# Patient Record
Sex: Female | Born: 1945 | Race: Black or African American | Hispanic: No | Marital: Married | Smoking: Never smoker
Health system: Southern US, Community
[De-identification: ages and names within clinical notes are randomized; demographics above are authoritative.]

## PROBLEM LIST (undated history)

## (undated) DIAGNOSIS — E119 Type 2 diabetes mellitus without complications: Secondary | ICD-10-CM

## (undated) DIAGNOSIS — I10 Essential (primary) hypertension: Secondary | ICD-10-CM

## (undated) DIAGNOSIS — M8580 Other specified disorders of bone density and structure, unspecified site: Secondary | ICD-10-CM

## (undated) DIAGNOSIS — N200 Calculus of kidney: Secondary | ICD-10-CM

## (undated) DIAGNOSIS — K219 Gastro-esophageal reflux disease without esophagitis: Secondary | ICD-10-CM

## (undated) DIAGNOSIS — E785 Hyperlipidemia, unspecified: Secondary | ICD-10-CM

## (undated) DIAGNOSIS — M1990 Unspecified osteoarthritis, unspecified site: Secondary | ICD-10-CM

## (undated) DIAGNOSIS — T7840XA Allergy, unspecified, initial encounter: Secondary | ICD-10-CM

## (undated) HISTORY — DX: Essential (primary) hypertension: I10

## (undated) HISTORY — PX: TONSILLECTOMY: SUR1361

## (undated) HISTORY — DX: Allergy, unspecified, initial encounter: T78.40XA

## (undated) HISTORY — DX: Hyperlipidemia, unspecified: E78.5

## (undated) HISTORY — DX: Other specified disorders of bone density and structure, unspecified site: M85.80

## (undated) HISTORY — DX: Unspecified osteoarthritis, unspecified site: M19.90

## (undated) HISTORY — PX: BILATERAL CARPAL TUNNEL RELEASE: SHX6508

## (undated) HISTORY — PX: ABDOMINAL HYSTERECTOMY: SHX81

## (undated) HISTORY — PX: KNEE SURGERY: SHX244

---

## 1997-11-20 ENCOUNTER — Ambulatory Visit (HOSPITAL_COMMUNITY): Admission: RE | Admit: 1997-11-20 | Discharge: 1997-11-20 | Payer: Self-pay | Admitting: Gastroenterology

## 2000-06-29 ENCOUNTER — Encounter: Payer: Self-pay | Admitting: Family Medicine

## 2000-06-29 ENCOUNTER — Encounter: Admission: RE | Admit: 2000-06-29 | Discharge: 2000-06-29 | Payer: Self-pay | Admitting: Family Medicine

## 2001-06-29 ENCOUNTER — Encounter: Admission: RE | Admit: 2001-06-29 | Discharge: 2001-06-29 | Payer: Self-pay | Admitting: Family Medicine

## 2001-06-29 ENCOUNTER — Encounter: Payer: Self-pay | Admitting: Family Medicine

## 2003-01-21 LAB — CONVERTED CEMR LAB: Pap Smear: NORMAL

## 2003-02-27 ENCOUNTER — Ambulatory Visit: Admission: RE | Admit: 2003-02-27 | Discharge: 2003-02-27 | Payer: Self-pay | Admitting: Gastroenterology

## 2003-12-07 ENCOUNTER — Inpatient Hospital Stay (HOSPITAL_COMMUNITY): Admission: EM | Admit: 2003-12-07 | Discharge: 2003-12-08 | Payer: Self-pay | Admitting: Emergency Medicine

## 2003-12-07 ENCOUNTER — Ambulatory Visit: Payer: Self-pay | Admitting: Internal Medicine

## 2003-12-07 ENCOUNTER — Ambulatory Visit: Payer: Self-pay | Admitting: Cardiology

## 2003-12-08 ENCOUNTER — Encounter: Payer: Self-pay | Admitting: Cardiology

## 2004-06-03 ENCOUNTER — Encounter: Admission: RE | Admit: 2004-06-03 | Discharge: 2004-06-03 | Payer: Self-pay | Admitting: Family Medicine

## 2004-10-31 ENCOUNTER — Emergency Department (HOSPITAL_COMMUNITY): Admission: EM | Admit: 2004-10-31 | Discharge: 2004-10-31 | Payer: Self-pay | Admitting: Emergency Medicine

## 2005-07-31 ENCOUNTER — Encounter: Admission: RE | Admit: 2005-07-31 | Discharge: 2005-07-31 | Payer: Self-pay | Admitting: Family Medicine

## 2005-08-11 ENCOUNTER — Encounter: Admission: RE | Admit: 2005-08-11 | Discharge: 2005-08-11 | Payer: Self-pay | Admitting: Family Medicine

## 2005-08-11 ENCOUNTER — Encounter (INDEPENDENT_AMBULATORY_CARE_PROVIDER_SITE_OTHER): Payer: Self-pay | Admitting: *Deleted

## 2006-09-01 ENCOUNTER — Ambulatory Visit: Payer: Self-pay | Admitting: Family Medicine

## 2006-09-01 DIAGNOSIS — M1990 Unspecified osteoarthritis, unspecified site: Secondary | ICD-10-CM | POA: Insufficient documentation

## 2006-09-01 DIAGNOSIS — J45909 Unspecified asthma, uncomplicated: Secondary | ICD-10-CM | POA: Insufficient documentation

## 2006-09-01 DIAGNOSIS — E785 Hyperlipidemia, unspecified: Secondary | ICD-10-CM | POA: Insufficient documentation

## 2006-09-01 DIAGNOSIS — J309 Allergic rhinitis, unspecified: Secondary | ICD-10-CM | POA: Insufficient documentation

## 2006-09-01 DIAGNOSIS — I10 Essential (primary) hypertension: Secondary | ICD-10-CM | POA: Insufficient documentation

## 2006-09-01 LAB — CONVERTED CEMR LAB
Cholesterol, target level: 200 mg/dL
HDL goal, serum: 40 mg/dL
LDL Goal: 130 mg/dL

## 2006-09-03 ENCOUNTER — Encounter: Payer: Self-pay | Admitting: Family Medicine

## 2006-09-08 ENCOUNTER — Ambulatory Visit: Payer: Self-pay | Admitting: Family Medicine

## 2006-09-09 LAB — CONVERTED CEMR LAB
BUN: 11 mg/dL (ref 6–23)
CO2: 31 meq/L (ref 19–32)
Calcium: 9.4 mg/dL (ref 8.4–10.5)
Chloride: 104 meq/L (ref 96–112)
Creatinine, Ser: 0.8 mg/dL (ref 0.4–1.2)
GFR calc Af Amer: 94 mL/min
GFR calc non Af Amer: 78 mL/min
Glucose, Bld: 121 mg/dL — ABNORMAL HIGH (ref 70–99)
Potassium: 3.9 meq/L (ref 3.5–5.1)
Sodium: 141 meq/L (ref 135–145)

## 2006-10-02 ENCOUNTER — Ambulatory Visit: Payer: Self-pay | Admitting: Family Medicine

## 2006-10-15 ENCOUNTER — Encounter: Admission: RE | Admit: 2006-10-15 | Discharge: 2006-10-15 | Payer: Self-pay | Admitting: Orthopedic Surgery

## 2007-04-07 ENCOUNTER — Ambulatory Visit: Payer: Self-pay | Admitting: Family Medicine

## 2007-04-12 DIAGNOSIS — E119 Type 2 diabetes mellitus without complications: Secondary | ICD-10-CM

## 2007-04-12 LAB — CONVERTED CEMR LAB
ALT: 17 units/L (ref 0–35)
AST: 22 units/L (ref 0–37)
Albumin: 3.7 g/dL (ref 3.5–5.2)
Alkaline Phosphatase: 54 units/L (ref 39–117)
BUN: 26 mg/dL — ABNORMAL HIGH (ref 6–23)
Bilirubin, Direct: 0.1 mg/dL (ref 0.0–0.3)
CO2: 29 meq/L (ref 19–32)
Calcium: 9.9 mg/dL (ref 8.4–10.5)
Chloride: 104 meq/L (ref 96–112)
Cholesterol: 224 mg/dL (ref 0–200)
Creatinine, Ser: 0.9 mg/dL (ref 0.4–1.2)
Direct LDL: 120.8 mg/dL
GFR calc Af Amer: 82 mL/min
GFR calc non Af Amer: 68 mL/min
Glucose, Bld: 122 mg/dL — ABNORMAL HIGH (ref 70–99)
HDL: 57 mg/dL (ref >39.0)
Potassium: 4.4 meq/L (ref 3.5–5.1)
Sodium: 140 meq/L (ref 135–145)
Total Bilirubin: 0.7 mg/dL (ref 0.3–1.2)
Total CHOL/HDL Ratio: 3.9
Total Protein: 7.1 g/dL (ref 6.0–8.3)
Triglycerides: 100 mg/dL (ref 0–149)
VLDL: 20 mg/dL (ref 0–40)

## 2007-04-19 ENCOUNTER — Encounter: Admission: RE | Admit: 2007-04-19 | Discharge: 2007-04-19 | Payer: Self-pay | Admitting: Orthopedic Surgery

## 2007-07-13 ENCOUNTER — Ambulatory Visit: Payer: Self-pay | Admitting: Family Medicine

## 2007-07-21 LAB — CONVERTED CEMR LAB
BUN: 15 mg/dL (ref 6–23)
CO2: 28 meq/L (ref 19–32)
Calcium: 9.5 mg/dL (ref 8.4–10.5)
Chloride: 103 meq/L (ref 96–112)
Creatinine, Ser: 0.9 mg/dL (ref 0.4–1.2)
GFR calc Af Amer: 82 mL/min
GFR calc non Af Amer: 68 mL/min
Glucose, Bld: 118 mg/dL — ABNORMAL HIGH (ref 70–99)
Hgb A1c MFr Bld: 6.1 % — ABNORMAL HIGH (ref 4.6–6.0)
Potassium: 4 meq/L (ref 3.5–5.1)
Sodium: 139 meq/L (ref 135–145)

## 2007-09-29 ENCOUNTER — Ambulatory Visit: Payer: Self-pay | Admitting: Family Medicine

## 2007-09-29 DIAGNOSIS — M79609 Pain in unspecified limb: Secondary | ICD-10-CM | POA: Insufficient documentation

## 2007-10-07 ENCOUNTER — Ambulatory Visit: Payer: Self-pay | Admitting: Family Medicine

## 2007-10-08 LAB — CONVERTED CEMR LAB
ALT: 12 units/L (ref 0–35)
AST: 15 units/L (ref 0–37)
Albumin: 3.8 g/dL (ref 3.5–5.2)
Alkaline Phosphatase: 54 units/L (ref 39–117)
BUN: 18 mg/dL (ref 6–23)
Bilirubin, Direct: 0.1 mg/dL (ref 0.0–0.3)
CO2: 28 meq/L (ref 19–32)
Calcium: 9.7 mg/dL (ref 8.4–10.5)
Chloride: 103 meq/L (ref 96–112)
Cholesterol: 243 mg/dL (ref 0–200)
Creatinine, Ser: 0.9 mg/dL (ref 0.4–1.2)
Direct LDL: 132.4 mg/dL
GFR calc Af Amer: 82 mL/min
GFR calc non Af Amer: 68 mL/min
Glucose, Bld: 126 mg/dL — ABNORMAL HIGH (ref 70–99)
HDL: 66.6 mg/dL (ref >39.0)
Potassium: 3.9 meq/L (ref 3.5–5.1)
Sodium: 139 meq/L (ref 135–145)
Total Bilirubin: 0.7 mg/dL (ref 0.3–1.2)
Total CHOL/HDL Ratio: 3.6
Total Protein: 6.9 g/dL (ref 6.0–8.3)
Triglycerides: 129 mg/dL (ref 0–149)
VLDL: 26 mg/dL (ref 0–40)

## 2007-10-11 ENCOUNTER — Ambulatory Visit: Payer: Self-pay | Admitting: Family Medicine

## 2007-10-26 ENCOUNTER — Encounter: Payer: Self-pay | Admitting: Family Medicine

## 2007-11-22 ENCOUNTER — Ambulatory Visit: Payer: Self-pay | Admitting: Family Medicine

## 2007-11-22 DIAGNOSIS — K2990 Gastroduodenitis, unspecified, without bleeding: Secondary | ICD-10-CM

## 2007-11-22 DIAGNOSIS — K2970 Gastritis, unspecified, without bleeding: Secondary | ICD-10-CM | POA: Insufficient documentation

## 2008-01-03 ENCOUNTER — Ambulatory Visit: Payer: Self-pay | Admitting: Family Medicine

## 2008-01-05 LAB — CONVERTED CEMR LAB
ALT: 17 units/L (ref 0–35)
AST: 20 units/L (ref 0–37)
Albumin: 3.9 g/dL (ref 3.5–5.2)
Alkaline Phosphatase: 45 units/L (ref 39–117)
BUN: 20 mg/dL (ref 6–23)
Bilirubin, Direct: 0.1 mg/dL (ref 0.0–0.3)
CO2: 30 meq/L (ref 19–32)
Calcium: 10 mg/dL (ref 8.4–10.5)
Chloride: 105 meq/L (ref 96–112)
Cholesterol: 226 mg/dL (ref 0–200)
Creatinine, Ser: 0.9 mg/dL (ref 0.4–1.2)
Creatinine,U: 108.3 mg/dL
Direct LDL: 126 mg/dL
GFR calc Af Amer: 82 mL/min
GFR calc non Af Amer: 67 mL/min
Glucose, Bld: 111 mg/dL — ABNORMAL HIGH (ref 70–99)
HDL: 64.2 mg/dL (ref >39.0)
Hgb A1c MFr Bld: 6 % (ref 4.6–6.0)
Microalb Creat Ratio: 1.8 mg/g (ref 0.0–30.0)
Microalb, Ur: 0.2 mg/dL (ref 0.0–1.9)
Potassium: 4.1 meq/L (ref 3.5–5.1)
Sodium: 142 meq/L (ref 135–145)
Total Bilirubin: 0.9 mg/dL (ref 0.3–1.2)
Total CHOL/HDL Ratio: 3.5
Total Protein: 6.9 g/dL (ref 6.0–8.3)
Triglycerides: 100 mg/dL (ref 0–149)
VLDL: 20 mg/dL (ref 0–40)

## 2008-01-18 ENCOUNTER — Ambulatory Visit: Payer: Self-pay | Admitting: Family Medicine

## 2008-02-11 ENCOUNTER — Ambulatory Visit (HOSPITAL_COMMUNITY): Admission: RE | Admit: 2008-02-11 | Discharge: 2008-02-11 | Payer: Self-pay | Admitting: Family Medicine

## 2008-02-18 ENCOUNTER — Encounter (INDEPENDENT_AMBULATORY_CARE_PROVIDER_SITE_OTHER): Payer: Self-pay | Admitting: *Deleted

## 2008-02-22 ENCOUNTER — Encounter: Payer: Self-pay | Admitting: Family Medicine

## 2008-02-22 LAB — HM COLONOSCOPY

## 2008-04-06 ENCOUNTER — Ambulatory Visit: Payer: Self-pay | Admitting: Family Medicine

## 2008-04-09 LAB — CONVERTED CEMR LAB
ALT: 14 units/L (ref 0–35)
AST: 19 units/L (ref 0–37)
Albumin: 4 g/dL (ref 3.5–5.2)
Alkaline Phosphatase: 61 units/L (ref 39–117)
BUN: 20 mg/dL (ref 6–23)
Bilirubin, Direct: 0 mg/dL (ref 0.0–0.3)
CO2: 29 meq/L (ref 19–32)
Calcium: 10 mg/dL (ref 8.4–10.5)
Chloride: 107 meq/L (ref 96–112)
Cholesterol: 235 mg/dL — ABNORMAL HIGH (ref 0–200)
Creatinine, Ser: 1 mg/dL (ref 0.4–1.2)
Direct LDL: 141.9 mg/dL
GFR calc non Af Amer: 72.1 mL/min (ref >60)
Glucose, Bld: 108 mg/dL — ABNORMAL HIGH (ref 70–99)
HDL: 58.7 mg/dL (ref >39.00)
Hgb A1c MFr Bld: 5.9 % (ref 4.6–6.5)
Potassium: 4 meq/L (ref 3.5–5.1)
Sodium: 142 meq/L (ref 135–145)
Total Bilirubin: 0.8 mg/dL (ref 0.3–1.2)
Total CHOL/HDL Ratio: 4
Total Protein: 7.2 g/dL (ref 6.0–8.3)
Triglycerides: 108 mg/dL (ref 0.0–149.0)
VLDL: 21.6 mg/dL (ref 0.0–40.0)

## 2008-04-12 ENCOUNTER — Ambulatory Visit: Payer: Self-pay | Admitting: Family Medicine

## 2008-04-12 DIAGNOSIS — N8110 Cystocele, unspecified: Secondary | ICD-10-CM | POA: Insufficient documentation

## 2008-04-13 ENCOUNTER — Encounter: Payer: Self-pay | Admitting: Family Medicine

## 2008-06-20 LAB — CONVERTED CEMR LAB: Pap Smear: NORMAL

## 2008-07-05 ENCOUNTER — Ambulatory Visit: Payer: Self-pay | Admitting: Obstetrics and Gynecology

## 2008-07-19 ENCOUNTER — Ambulatory Visit: Payer: Self-pay | Admitting: Obstetrics and Gynecology

## 2008-08-02 ENCOUNTER — Ambulatory Visit: Payer: Self-pay | Admitting: Obstetrics and Gynecology

## 2008-08-10 ENCOUNTER — Ambulatory Visit: Payer: Self-pay | Admitting: Obstetrics and Gynecology

## 2008-08-17 ENCOUNTER — Ambulatory Visit: Payer: Self-pay | Admitting: Obstetrics and Gynecology

## 2008-09-07 ENCOUNTER — Ambulatory Visit: Payer: Self-pay | Admitting: Obstetrics and Gynecology

## 2008-09-14 ENCOUNTER — Ambulatory Visit: Payer: Self-pay | Admitting: Obstetrics and Gynecology

## 2008-09-29 ENCOUNTER — Ambulatory Visit: Payer: Self-pay | Admitting: Obstetrics & Gynecology

## 2008-12-07 ENCOUNTER — Ambulatory Visit: Payer: Self-pay | Admitting: Family Medicine

## 2008-12-07 DIAGNOSIS — M1A9XX0 Chronic gout, unspecified, without tophus (tophi): Secondary | ICD-10-CM

## 2008-12-07 LAB — CONVERTED CEMR LAB
ALT: 16 units/L (ref 0–35)
AST: 20 units/L (ref 0–37)
Albumin: 4.2 g/dL (ref 3.5–5.2)
Alkaline Phosphatase: 56 units/L (ref 39–117)
BUN: 18 mg/dL (ref 6–23)
Bilirubin, Direct: 0.1 mg/dL (ref 0.0–0.3)
CO2: 29 meq/L (ref 19–32)
Calcium: 9.9 mg/dL (ref 8.4–10.5)
Chloride: 103 meq/L (ref 96–112)
Cholesterol: 224 mg/dL — ABNORMAL HIGH (ref 0–200)
Creatinine, Ser: 0.9 mg/dL (ref 0.4–1.2)
Direct LDL: 127.5 mg/dL
GFR calc non Af Amer: 81.24 mL/min (ref >60)
Glucose, Bld: 104 mg/dL — ABNORMAL HIGH (ref 70–99)
HDL: 66.7 mg/dL (ref >39.00)
Hgb A1c MFr Bld: 5.9 % (ref 4.6–6.5)
Potassium: 4 meq/L (ref 3.5–5.1)
Sodium: 142 meq/L (ref 135–145)
Total Bilirubin: 1 mg/dL (ref 0.3–1.2)
Total CHOL/HDL Ratio: 3
Total Protein: 7.2 g/dL (ref 6.0–8.3)
Triglycerides: 92 mg/dL (ref 0.0–149.0)
Uric Acid, Serum: 8.4 mg/dL — ABNORMAL HIGH (ref 2.4–7.0)
VLDL: 18.4 mg/dL (ref 0.0–40.0)

## 2009-04-03 ENCOUNTER — Ambulatory Visit (HOSPITAL_COMMUNITY): Admission: RE | Admit: 2009-04-03 | Discharge: 2009-04-03 | Payer: Self-pay | Admitting: Family Medicine

## 2009-05-18 ENCOUNTER — Encounter: Admission: RE | Admit: 2009-05-18 | Discharge: 2009-05-18 | Payer: Self-pay | Admitting: Orthopedic Surgery

## 2009-06-08 ENCOUNTER — Ambulatory Visit: Payer: Self-pay | Admitting: Family Medicine

## 2009-06-14 ENCOUNTER — Ambulatory Visit: Payer: Self-pay | Admitting: Family Medicine

## 2009-06-14 LAB — CONVERTED CEMR LAB
ALT: 17 units/L (ref 0–35)
AST: 20 units/L (ref 0–37)
Albumin: 4 g/dL (ref 3.5–5.2)
Alkaline Phosphatase: 55 units/L (ref 39–117)
BUN: 23 mg/dL (ref 6–23)
Basophils Absolute: 0 10*3/uL (ref 0.0–0.1)
Basophils Relative: 0.7 % (ref 0.0–3.0)
Bilirubin, Direct: 0.1 mg/dL (ref 0.0–0.3)
CO2: 31 meq/L (ref 19–32)
Calcium: 10 mg/dL (ref 8.4–10.5)
Chloride: 101 meq/L (ref 96–112)
Cholesterol: 226 mg/dL — ABNORMAL HIGH (ref 0–200)
Creatinine, Ser: 0.9 mg/dL (ref 0.4–1.2)
Creatinine,U: 138.8 mg/dL
Direct LDL: 134.5 mg/dL
Eosinophils Absolute: 0.1 10*3/uL (ref 0.0–0.7)
Eosinophils Relative: 1.8 % (ref 0.0–5.0)
GFR calc non Af Amer: 81.11 mL/min (ref >60)
Glucose, Bld: 110 mg/dL — ABNORMAL HIGH (ref 70–99)
HCT: 37 % (ref 36.0–46.0)
HDL: 61.3 mg/dL (ref >39.00)
Hemoglobin: 12.6 g/dL (ref 12.0–15.0)
Hgb A1c MFr Bld: 6.1 % (ref 4.6–6.5)
Lymphocytes Relative: 23.9 % (ref 12.0–46.0)
Lymphs Abs: 1.8 10*3/uL (ref 0.7–4.0)
MCHC: 34 g/dL (ref 30.0–36.0)
MCV: 86.8 fL (ref 78.0–100.0)
Microalb Creat Ratio: 0.4 mg/g (ref 0.0–30.0)
Microalb, Ur: 0.5 mg/dL (ref 0.0–1.9)
Monocytes Absolute: 0.6 10*3/uL (ref 0.1–1.0)
Monocytes Relative: 8.4 % (ref 3.0–12.0)
Neutro Abs: 4.8 10*3/uL (ref 1.4–7.7)
Neutrophils Relative %: 65.2 % (ref 43.0–77.0)
Platelets: 300 10*3/uL (ref 150.0–400.0)
Potassium: 4.4 meq/L (ref 3.5–5.1)
RBC: 4.26 M/uL (ref 3.87–5.11)
RDW: 13.6 % (ref 11.5–14.6)
Sodium: 139 meq/L (ref 135–145)
TSH: 1.37 microintl units/mL (ref 0.35–5.50)
Total Bilirubin: 0.6 mg/dL (ref 0.3–1.2)
Total CHOL/HDL Ratio: 4
Total Protein: 6.9 g/dL (ref 6.0–8.3)
Triglycerides: 103 mg/dL (ref 0.0–149.0)
Uric Acid, Serum: 8.5 mg/dL — ABNORMAL HIGH (ref 2.4–7.0)
VLDL: 20.6 mg/dL (ref 0.0–40.0)
WBC: 7.4 10*3/uL (ref 4.5–10.5)

## 2009-06-26 ENCOUNTER — Telehealth (INDEPENDENT_AMBULATORY_CARE_PROVIDER_SITE_OTHER): Payer: Self-pay | Admitting: *Deleted

## 2009-06-27 ENCOUNTER — Encounter: Payer: Self-pay | Admitting: Cardiovascular Disease

## 2009-06-27 ENCOUNTER — Ambulatory Visit: Payer: Self-pay | Admitting: Cardiovascular Disease

## 2009-06-27 ENCOUNTER — Encounter (HOSPITAL_COMMUNITY): Admission: RE | Admit: 2009-06-27 | Discharge: 2009-08-21 | Payer: Self-pay | Admitting: Family Medicine

## 2009-06-27 ENCOUNTER — Telehealth: Payer: Self-pay | Admitting: Family Medicine

## 2009-06-27 ENCOUNTER — Telehealth: Payer: Self-pay | Admitting: Cardiovascular Disease

## 2009-06-27 ENCOUNTER — Ambulatory Visit: Payer: Self-pay

## 2009-06-27 DIAGNOSIS — R9439 Abnormal result of other cardiovascular function study: Secondary | ICD-10-CM | POA: Insufficient documentation

## 2009-06-28 ENCOUNTER — Ambulatory Visit: Payer: Self-pay | Admitting: Internal Medicine

## 2009-06-28 ENCOUNTER — Encounter (INDEPENDENT_AMBULATORY_CARE_PROVIDER_SITE_OTHER): Payer: Self-pay | Admitting: *Deleted

## 2009-06-28 DIAGNOSIS — R0789 Other chest pain: Secondary | ICD-10-CM | POA: Insufficient documentation

## 2009-06-28 LAB — CONVERTED CEMR LAB
BUN: 20 mg/dL (ref 6–23)
Basophils Absolute: 0.1 10*3/uL (ref 0.0–0.1)
Basophils Relative: 0.6 % (ref 0.0–3.0)
CO2: 27 meq/L (ref 19–32)
Calcium: 10.4 mg/dL (ref 8.4–10.5)
Chloride: 103 meq/L (ref 96–112)
Creatinine, Ser: 0.9 mg/dL (ref 0.4–1.2)
Eosinophils Absolute: 0.1 10*3/uL (ref 0.0–0.7)
Eosinophils Relative: 0.8 % (ref 0.0–5.0)
GFR calc non Af Amer: 82.15 mL/min (ref >60)
Glucose, Bld: 100 mg/dL — ABNORMAL HIGH (ref 70–99)
HCT: 39 % (ref 36.0–46.0)
Hemoglobin: 13.3 g/dL (ref 12.0–15.0)
INR: 1 (ref 0.8–1.0)
Lymphocytes Relative: 19.1 % (ref 12.0–46.0)
Lymphs Abs: 1.9 10*3/uL (ref 0.7–4.0)
MCHC: 34.2 g/dL (ref 30.0–36.0)
MCV: 85.9 fL (ref 78.0–100.0)
Monocytes Absolute: 0.7 10*3/uL (ref 0.1–1.0)
Monocytes Relative: 7.3 % (ref 3.0–12.0)
Neutro Abs: 7.1 10*3/uL (ref 1.4–7.7)
Neutrophils Relative %: 72.2 % (ref 43.0–77.0)
Platelets: 284 10*3/uL (ref 150.0–400.0)
Potassium: 4.1 meq/L (ref 3.5–5.1)
Prothrombin Time: 10.5 s (ref 9.7–11.8)
RBC: 4.54 M/uL (ref 3.87–5.11)
RDW: 13.6 % (ref 11.5–14.6)
Sodium: 141 meq/L (ref 135–145)
WBC: 9.9 10*3/uL (ref 4.5–10.5)
aPTT: 28.8 s (ref 21.7–28.8)

## 2009-06-29 ENCOUNTER — Inpatient Hospital Stay (HOSPITAL_BASED_OUTPATIENT_CLINIC_OR_DEPARTMENT_OTHER): Admission: RE | Admit: 2009-06-29 | Discharge: 2009-06-29 | Payer: Self-pay | Admitting: Cardiology

## 2009-06-29 ENCOUNTER — Ambulatory Visit: Payer: Self-pay | Admitting: Cardiology

## 2009-07-02 ENCOUNTER — Telehealth: Payer: Self-pay | Admitting: Internal Medicine

## 2009-07-11 ENCOUNTER — Inpatient Hospital Stay (HOSPITAL_COMMUNITY): Admission: RE | Admit: 2009-07-11 | Discharge: 2009-07-15 | Payer: Self-pay | Admitting: Orthopedic Surgery

## 2009-08-13 ENCOUNTER — Encounter: Payer: Self-pay | Admitting: Orthopedic Surgery

## 2009-08-17 ENCOUNTER — Encounter: Payer: Self-pay | Admitting: Family Medicine

## 2009-08-20 ENCOUNTER — Encounter: Payer: Self-pay | Admitting: Orthopedic Surgery

## 2009-09-20 ENCOUNTER — Encounter: Payer: Self-pay | Admitting: Orthopedic Surgery

## 2009-10-01 ENCOUNTER — Encounter: Payer: Self-pay | Admitting: Internal Medicine

## 2009-10-01 ENCOUNTER — Ambulatory Visit: Payer: Self-pay | Admitting: Cardiovascular Disease

## 2009-10-01 ENCOUNTER — Ambulatory Visit (HOSPITAL_COMMUNITY): Admission: RE | Admit: 2009-10-01 | Discharge: 2009-10-01 | Payer: Self-pay | Admitting: Internal Medicine

## 2009-10-01 ENCOUNTER — Ambulatory Visit: Payer: Self-pay | Admitting: Internal Medicine

## 2009-10-01 DIAGNOSIS — R0989 Other specified symptoms and signs involving the circulatory and respiratory systems: Secondary | ICD-10-CM

## 2009-10-05 ENCOUNTER — Encounter: Payer: Self-pay | Admitting: Family Medicine

## 2009-10-20 ENCOUNTER — Encounter: Payer: Self-pay | Admitting: Orthopedic Surgery

## 2009-11-12 ENCOUNTER — Ambulatory Visit: Payer: Self-pay | Admitting: Family Medicine

## 2009-11-13 LAB — CONVERTED CEMR LAB
ALT: 15 units/L (ref 0–35)
AST: 18 units/L (ref 0–37)
Albumin: 3.7 g/dL (ref 3.5–5.2)
Alkaline Phosphatase: 60 units/L (ref 39–117)
BUN: 23 mg/dL (ref 6–23)
Bilirubin, Direct: 0.1 mg/dL (ref 0.0–0.3)
CO2: 26 meq/L (ref 19–32)
Calcium: 9.7 mg/dL (ref 8.4–10.5)
Chloride: 103 meq/L (ref 96–112)
Cholesterol: 217 mg/dL — ABNORMAL HIGH (ref 0–200)
Creatinine, Ser: 0.9 mg/dL (ref 0.4–1.2)
Direct LDL: 119.2 mg/dL
GFR calc non Af Amer: 86.53 mL/min (ref >60)
Glucose, Bld: 112 mg/dL — ABNORMAL HIGH (ref 70–99)
HDL: 62.9 mg/dL (ref >39.00)
Hgb A1c MFr Bld: 6.5 % (ref 4.6–6.5)
Potassium: 4 meq/L (ref 3.5–5.1)
Sodium: 138 meq/L (ref 135–145)
Total Bilirubin: 0.6 mg/dL (ref 0.3–1.2)
Total CHOL/HDL Ratio: 3
Total Protein: 6.7 g/dL (ref 6.0–8.3)
Triglycerides: 87 mg/dL (ref 0.0–149.0)
Uric Acid, Serum: 7.9 mg/dL — ABNORMAL HIGH (ref 2.4–7.0)
VLDL: 17.4 mg/dL (ref 0.0–40.0)

## 2009-11-20 ENCOUNTER — Ambulatory Visit: Payer: Self-pay | Admitting: Family Medicine

## 2009-11-20 LAB — CONVERTED CEMR LAB

## 2010-02-10 ENCOUNTER — Encounter: Payer: Self-pay | Admitting: Orthopedic Surgery

## 2010-02-10 ENCOUNTER — Encounter: Payer: Self-pay | Admitting: Family Medicine

## 2010-02-17 LAB — CONVERTED CEMR LAB
Hgb A1c MFr Bld: 6.4 % — ABNORMAL HIGH (ref 4.6–6.0)
LDL Goal: 100 mg/dL

## 2010-02-21 NOTE — Progress Notes (Signed)
Summary: APPT BEFORE SURGERY  Phone Note Call from Patient Call back at Home Phone [phone]   Caller: [phone] Reason for Call: Talk to Nurse Summary of Call: HAS HOSPITAL FU ON 6-23 HAS A SURGERY SCHEDULED FOR 6-22 DOES DR Erionna Strum NEED TO SEE HER BEFORE THE SURGERY? Initial call taken by: Glynda Jaeger,  July 02, 2009 2:58 PM  Follow-up for Phone Call        No she does not need to be seen as long as she is not have any problems around cath site.   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  Does not need to be done prior to surgery. Follow-up by: Sherrill Raring, MD, [facility],  July 10, 2009 8:30 AM     Appended Document: APPT BEFORE SURGERY PER PT  NO PROBLEMS AT CATH SITE PT AWARE MAY PROCEED  WITH SURGERY APPT WITH DR Maxemiliano Riel RESHEDULED TO AUG 1 AT 3:15 .

## 2010-02-21 NOTE — Assessment & Plan Note (Signed)
Summary: YEARLY PHYSICAL/JRR   Vital Signs:  Patient profile:   65 year old female Height:      60 inches Weight:      204.5 pounds BMI:     40.08 Temp:     98.8 degrees F oral Pulse rate:   80 / minute Pulse rhythm:   regular BP sitting:   120 / 82  (left arm) Cuff size:   large  Vitals Entered By: Benny Lennert CMA Duncan Dull) (November 20, 2009 11:23 AM)  History of Present Illness: Chief complaint cpx  Doing well overall. Right knee surgery.Marland KitchenMarland Kitchen6/2011..no complications, doing well. MD..Dr. Montez Morita.  Did have ? allergic reaction..rash/itching to ?coumadin she was on diuring the surgery. Doing water aerobics now at [facility]. Has left knee surgery scheduled 03/21/2010  Stopped allopurinol thinking it could have been casue of allergic reaction.  Diet changes have improved uric acid some..but still high..restart allopurinol.   Seeing Dr. Tenny Craw: For  During preop eval...found to have...MYOCARDIAL PERFUSION SCAN, WITH STRESS TEST, ABNORMAL (ICD-794.39) Work up without signif CAD.  Echo today LV and RV normal.  If she has any shunt from a fistula it is minimal.  I reviewed cath and am not impressed.  I would not pursue furhter.  She is not short of breath.  Told to take ASA.Marland Kitchen but she feels baby aspirin causes itching as well.   DM, well controlled although trending up..Was on steroid in August for several days.  High cholesterol..Dr. Tenny Craw plans Carotid US to eval right carotid bruit .Marland KitchenMarland KitchenShe had leg cramps with pravastatin in past..LDL lower today... but not yet at goal. Will awaitcards recs.   Cystocele.Marland KitchenGYN tried multiple pessaries.Marland Kitchennone worked ..only option is surgery if pt interested in future.   Hypertension History:      She denies headache, chest pain, palpitations, dyspnea with exertion, peripheral edema, visual symptoms, neurologic problems, syncope, and side effects from treatment.        Positive major cardiovascular risk factors include female age 64 years old or older, diabetes,  hyperlipidemia, and hypertension.  Negative major cardiovascular risk factors include non-tobacco-user status.        Further assessment for target organ damage reveals no history of ASHD, cardiac end-organ damage (CHF/LVH), stroke/TIA, peripheral vascular disease, renal insufficiency, or hypertensive retinopathy.    Lipid Management History:      Positive NCEP/ATP III risk factors include female age 69 years old or older, diabetes, and hypertension.  Negative NCEP/ATP III risk factors include HDL cholesterol greater than 60, non-tobacco-user status, no ASHD (atherosclerotic heart disease), no prior stroke/TIA, and no peripheral vascular disease.      Problems Prior to Update: 1)  Carotid Bruit, Right, Diffuse  (ICD-785.9) 2)  Other Chest Pain  (ICD-786.59) 3)  Myocardial Perfusion Scan, With Stress Test, Abnormal  (ICD-794.39) 4)  Preoperative Examination  (ICD-V72.84) 5)  Gout, Unspecified  (ICD-274.9) 6)  Routine Gynecological Examination  (ICD-V72.31) 7)  Well Woman  (ICD-V70.0) 8)  Oth Prolaps Vag Walls w/o Mention Utern Prolaps  (ICD-618.09) 9)  Special Screening For Osteoporosis  (ICD-V82.81) 10)  Other Screening Mammogram  (ICD-V76.12) 11)  Gastritis  (ICD-535.50) 12)  Foot Pain, Right  (ICD-729.5) 13)  Aodm  (ICD-250.00) 14)  Hypertension  (ICD-401.9) 15)  Hyperlipidemia  (ICD-272.4) 16)  Allergic Rhinitis  (ICD-477.9) 17)  Asthma  (ICD-493.90) 18)  Osteoarthritis  (ICD-715.90)  Current Medications (verified): 1)  Maxzide 75-50 Mg  Tabs (Triamterene-Hctz) .... 1/2 Tab Once Daily. 2)  Sulindac 200 Mg  Tabs (Sulindac) .Marland KitchenMarland KitchenMarland Kitchen  1 By Mouth Two Times A Day As Needed Arthritis 3)  Lisinopril 20 Mg Tabs (Lisinopril) .... Take One By Mouth Daily  Allergies (verified): No Known Drug Allergies  Past History:  Past medical, surgical, family and social histories (including risk factors) reviewed, and no changes noted (except as noted below).  Past Medical History: Reviewed history  from 09/01/2006 and no changes required. Osteoarthritis Asthma Allergic rhinitis Hyperlipidemia Hypertension  Past Surgical History: 1970s abd hysterectomy for fibroids, ovaries still present, ? cervix Tonsillectomy right knee surgery.Marland KitchenMarland Kitchen6/2011  Family History: Reviewed history from 09/01/2006 and no changes required. father died age 42 CHF, CVA mother died 62s asthma 4 sisters and 5 brothers: arthritis MGF: DM MGM: colon cancer  Social History: Reviewed history from 09/01/2006 and no changes required. Occupation:  semiretired, babysitter Married x 42 Never Smoked Alcohol use-no Drug use-no Regular exercise-yes, walks, water aerobics Diet: healthy, veggies, fruit, avoid salts  Review of Systems       right carpal tunnel pain ongoing  in AMs ..wears brace at night.  General:  Denies fatigue and fever. CV:  Denies chest pain or discomfort. Resp:  Denies cough. GI:  Denies abdominal pain. GU:  Denies dysuria; uterine prolapse..irritating her minimally at this point. .  Physical Exam  General:  overweihgt female iNNAD Eyes:  No corneal or conjunctival inflammation noted. EOMI. Perrla. Funduscopic exam benign, without hemorrhages, exudates or papilledema. Vision grossly normal. Ears:  External ear exam shows no significant lesions or deformities.  Otoscopic examination reveals clear canals, tympanic membranes are intact bilaterally without bulging, retraction, inflammation or discharge. Hearing is grossly normal bilaterally. Nose:  External nasal examination shows no deformity or inflammation. Nasal mucosa are pink and moist without lesions or exudates. Mouth:  Oral mucosa and oropharynx without lesions or exudates.  Teeth in good repair. Neck:  no carotid bruit or thyromegaly no cervical or supraclavicular lymphadenopathy  Chest Wall:  No deformities, masses, or tenderness noted. Breasts:  No mass, nodules, thickening, tenderness, bulging, retraction, inflamation, nipple  discharge or skin changes noted.   Lungs:  Normal respiratory effort, chest expands symmetrically. Lungs are clear to auscultation, no crackles or wheezes. Heart:  Normal rate and regular rhythm. S1 and S2 normal without gallop, murmur, click, rub or other extra sounds. Abdomen:  Bowel sounds positive,abdomen soft and non-tender without masses, organomegaly or hernias noted. Genitalia:  normal introitus, no vaginal discharge, mucosa pink and moist, and cystocele.   Msk:  well healing scar right knee Pulses:  R and L posterior tibial pulses are full and equal bilaterally  Extremities:  no edmea Neurologic:  No cranial nerve deficits noted. Station and gait are normal. DTRs are symmetrical throughout. Sensory, motor and coordinative functions appear intact. Skin:  Intact without suspicious lesions or rashes Psych:  Cognition and judgment appear intact. Alert and cooperative with normal attention span and concentration. No apparent delusions, illusions, hallucinations  Diabetes Management Exam:    Foot Exam (with socks and/or shoes not present):       Sensory-Pinprick/Light touch:          Left medial foot (L-4): normal          Left dorsal foot (L-5): normal          Left lateral foot (S-1): normal          Right medial foot (L-4): normal          Right dorsal foot (L-5): normal          Right lateral  foot (S-1): normal       Sensory-Monofilament:          Left foot: normal          Right foot: normal       Inspection:          Left foot: normal          Right foot: normal       Nails:          Left foot: normal          Right foot: normal   Impression & Recommendations:  Problem # 1:  WELL WOMAN (ICD-V70.0) The patient's preventative maintenance and recommended screening tests for an annual wellness exam were reviewed in full today. Brought up to date unless services declined.  Counselled on the importance of diet, exercise, and its role in overall health and mortality. The  patient's FH and SH was reviewed, including their home life, tobacco status, and drug and alcohol status.     Problem # 2:  ROUTINE GYNECOLOGICAL EXAMINATION (ICD-V72.31) DVE, pap every 2-3 years.   Problem # 3:  CAROTID BRUIT, RIGHT, DIFFUSE (ICD-785.9) Carotid dopplers pending per [facility]ds.   Problem # 4:  MYOCARDIAL PERFUSION SCAN, WITH STRESS TEST, ABNORMAL (ICD-794.39) per Cards.   Problem # 5:  GOUT, UNSPECIFIED (ICD-274.9) Inadequate control..restart allopurinol. Good job with diet changes.  Her updated medication list for this problem includes:    Sulindac 200 Mg Tabs (Sulindac) .Marland Kitchen... 1 by mouth two times a day as needed arthritis  Problem # 6:  AODM (ICD-250.00) Well controlled with diet...continue work on weight loss, exercise.  Her updated medication list for this problem includes:    Lisinopril 20 Mg Tabs (Lisinopril) .Marland Kitchen... Take one by mouth daily  Problem # 7:  HYPERTENSION (ICD-401.9) Well controlled. Continue current medication.  Her updated medication list for this problem includes:    Maxzide 75-50 Mg Tabs (Triamterene-hctz) .Marland Kitchen... 1/2 tab once daily.    Lisinopril 20 Mg Tabs (Lisinopril) .Marland Kitchen... Take one by mouth daily  Problem # 8:  HYPERLIPIDEMIA (ICD-272.4) Not at goal LDL <100...imrpoving with diet change though.Melanie Lang wishes to contnue to work on this. Will also await Cardiologists recommendations.  Complete Medication List: 1)  Maxzide 75-50 Mg Tabs (Triamterene-hctz) .... 1/2 tab once daily. 2)  Sulindac 200 Mg Tabs (Sulindac) .Marland Kitchen.. 1 by mouth two times a day as needed arthritis 3)  Lisinopril 20 Mg Tabs (Lisinopril) .... Take one by mouth daily  Hypertension Assessment/Plan:      The patient's hypertensive risk group is category C: Target organ damage and/or diabetes.  Today's blood pressure is 120/82.  Her blood pressure goal is < 130/80.  Lipid Assessment/Plan:      Based on NCEP/ATP III, the patient's risk factor category is "history of  diabetes".  The patient's lipid goals are as follows: Total cholesterol goal is 200; LDL cholesterol goal is 100; HDL cholesterol goal is 40; Triglyceride goal is 150.  Her LDL cholesterol goal has not been met.    Patient Instructions: 1)  Please schedule a follow-up appointment in 6 months .  2)  HgBA1c prior to visit  ICD-9: 250.00 3)   lipids, uric acid< CMET Dx 274.9, 272.0 4)  Work on getting back to walking as able. 5)   Okay to increase sulidac to two times a day for right wirst carpal tunnel.  6)  Wear brace during the day and at night. 7)   Resstart allopurinol. 8)  Call insurance about shingles vaccine coverage.   Orders Added: 1)  Est. Patient 40-64 years [99396]    Current Allergies (reviewed today): No known allergies   Flu Vaccine Next Due:  Refused Last PAP:  normal (06/20/2008 9:34:04 AM) PAP Result Date:  11/20/2009 PAP Result:  DVE, no pap PAP Next Due:  1 yr    Past Medical History:    Reviewed history from 09/01/2006 and no changes required:       Osteoarthritis       Asthma       Allergic rhinitis       Hyperlipidemia       Hypertension  Past Surgical History:    Reviewed history from 09/01/2006 and no changes required:       1970s abd hysterectomy for fibroids, ovaries still present, ? cervix       Tonsillectomy       right knee surgery.Marland KitchenMarland Kitchen6/2011

## 2010-02-21 NOTE — Assessment & Plan Note (Signed)
Summary: abnormal myoview   Visit Type:  Initial Consult Primary Provider:  Kerby Nora MD   History of Present Illness: patient is a 65 year old with no known CAD.  has history of HTN, dyslipidemia.   She is being evaluated for knee surgery.  Was set up for a Lexiscan myoview by A. Bedsole.  This showed anteroseptal and apical ischemia. The patient denies chest pain.  Breathing is ok.  She can walk stairs.  Used to walk 4 the 1 mile lper day.  Last time she was able to do this was 8 months ago becuase of knees.  Current Medications (verified): 1)  Maxzide 75-50 Mg  Tabs (Triamterene-Hctz) .... 1/2 Tab Once Daily. 2)  Sulindac 200 Mg  Tabs (Sulindac) .Marland Kitchen.. 1 By Mouth Two Times A Day As Needed Arthritis 3)  Lisinopril 20 Mg Tabs (Lisinopril) .... Take One By Mouth Daily 4)  Allopurinol 100 Mg Tabs (Allopurinol) .... Take 1 Tablet By Mouth Once A Day 5)  Pravastatin Sodium 20 Mg Tabs (Pravastatin Sodium) .... Take 1 Tablet By Mouth Once A Day  Allergies (verified): No Known Drug Allergies  Past History:  Past Medical History: Last updated: 09-30-2006 Osteoarthritis Asthma Allergic rhinitis Hyperlipidemia Hypertension  Past Surgical History: Last updated: 30-Sep-2006 1970s abd hysterectomy for fibroids, ovaries still present, ? cervix Tonsillectomy  Family History: Last updated: 09-30-06 father died age 42 CHF, CVA mother died 68s asthma 4 sisters and 5 brothers: arthritis MGF: DM MGM: colon cancer  Social History: Last updated: 09/30/2006 Occupation:  semiretired, babysitter Married x 42 Never Smoked Alcohol use-no Drug use-no Regular exercise-yes, walks, water aerobics Diet: healthy, veggies, fruit, avoid salts  Review of Systems       All systmes revewed.  Negative to the above problem except as noted above.  Vital Signs:  Patient profile:   65 year old female Height:      60 inches Weight:      201 pounds BMI:     39.40 Pulse rate:   78 / minute BP  sitting:   116 / 80  (left arm) Cuff size:   regular  Vitals Entered By: Burnett Kanaris, CNA (June 28, 2009 10:50 AM)  Physical Exam  Additional Exam:  patient is in NAD HEENT:  Normocephalic, atraumatic. EOMI, PERRLA.  Neck: JVP is normal. No thyromegaly. No bruits.  Lungs: clear to auscultation. No rales no wheezes.  Heart: Regular rate and rhythm. Normal S1, S2. No S3.   No significant murmurs. PMI not displaced.  Abdomen:  Supple, nontender. Normal bowel sounds. No masses. No hepatomegaly.  Extremities:   Good distal pulses throughout. No lower extremity edema.  Musculoskeletal :moving all extremities.  Neuro:   alert and oriented x3.    EKG  Procedure date:  06/27/2009  Findings:      NSR.  78 bpm.  Impression & Recommendations:  Problem # 1:  MYOCARDIAL PERFUSION SCAN, WITH STRESS TEST, ABNORMAL (ICD-794.39) I have reviewed the patient's myoview.  She does have some breast tissue shadowing anteriorly, but  I am not convinced there is any shifting of breast tissue.  Defect is significant in size Patient is rel asymptomatic though her activity is limited some by DJD I would recomm a left heart cath to define anatomy.  Discussed risks, benefits with patient   Understands and agrees to proceed. Will Rx for IV dye allergie (allergic to shell fish)  Problem # 2:  HYPERTENSION (ICD-401.9) Controlled.  Continue meds. Her updated medication  list for this problem includes:    Maxzide 75-50 Mg Tabs (Triamterene-hctz) .Marland Kitchen... 1/2 tab once daily.    Lisinopril 20 Mg Tabs (Lisinopril) .Marland Kitchen... Take one by mouth daily  Problem # 3:  HYPERLIPIDEMIA (ICD-272.4) Keep on meds.  Will need to be followed Her updated medication list for this problem includes:    Pravastatin Sodium 20 Mg Tabs (Pravastatin sodium) .Marland Kitchen... Take 1 tablet by mouth once a day  Other Orders: TLB-BMP (Basic Metabolic Panel-BMET) (80048-METABOL) TLB-CBC Platelet - w/Differential (85025-CBCD) TLB-PT (Protime)  (85610-PTP) TLB-PTT (85730-PTTL) Cardiac Catheterization (Cardiac Cath)  Patient Instructions: 1)  Your physician recommends that you return for lab work in: lab work today 2)  Your physician has requested that you have a cardiac catheterization.  Cardiac catheterization is used to diagnose and/or treat various heart conditions. Doctors may recommend this procedure for a number of different reasons. The most common reason is to evaluate chest pain. Chest pain can be a symptom of coronary artery disease (CAD), and cardiac catheterization can show whether plaque is narrowing or blocking your heart's arteries. This procedure is also used to evaluate the valves, as well as measure the blood flow and oxygen levels in different parts of your heart.  For further information please visit [email or web address]  Please follow instruction sheet, as given. Prescriptions: PREDNISONE 20 MG TABS (PREDNISONE) 3 tabs at 10pm tonight and 3 tabs at 7am tomorrow  #6 x 0   Entered by:   Layne Benton, RN, BSN   Authorized by:   Sherrill Raring, MD, [facility]   Signed by:   Layne Benton, RN, BSN on 06/28/2009   Method used:   Electronically to        [facility] [phone]* (retail)       [address] [place], [place]  [address]       Ph: [phone] or [phone]       Fax: [phone]   RxID:   [id number]

## 2010-02-21 NOTE — Cardiovascular Report (Signed)
Summary: Pre-Cath Orders  Pre-Cath Orders   Imported By: Debby Freiberg 07/13/2009 16:40:31  _____________________________________________________________________  External Attachment:    Type:   Image     Comment:   External Document

## 2010-02-21 NOTE — Letter (Signed)
Summary: CMN for [facility]  CMN for [facility]   Imported By: Sherian Rein 10/12/2009 08:51:58  _____________________________________________________________________  External Attachment:    Type:   Image     Comment:   External Document

## 2010-02-21 NOTE — Assessment & Plan Note (Signed)
Summary: eph   Visit Type:  Follow-up Primary Ryott Rafferty:  Kerby Nora MD  CC:  no complaints.  History of Present Illness: Patient is a 65 year old who I saw in the spring as part of preop risk stratification.  A myoview was done that was not normal.  She went on to have cardiac cath that showed no signif CAD.  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  Rec echo to evaluate.   Since I saw her last the patient has done well.  She got through [facility] without problem.  Now planning to have LKA in October. Breathing is OK.  NO chest pains.  Current Medications (verified): 1)  Maxzide 75-50 Mg  Tabs (Triamterene-Hctz) .... 1/2 Tab Once Daily. 2)  Sulindac 200 Mg  Tabs (Sulindac) .Marland Kitchen.. 1 By Mouth Two Times A Day As Needed Arthritis 3)  Lisinopril 20 Mg Tabs (Lisinopril) .... Take One By Mouth Daily 4)  Prednisone 20 Mg Tabs (Prednisone) .... 3 Tabs At 10pm Tonight and 3 Tabs At 7am Tomorrow  Allergies: No Known Drug Allergies  Past History:  Past medical, surgical, family and social histories (including risk factors) reviewed, and no changes noted (except as noted below).  Past Medical History: Reviewed history from 09/01/2006 and no changes required. Osteoarthritis Asthma Allergic rhinitis Hyperlipidemia Hypertension  Past Surgical History: Reviewed history from 09/01/2006 and no changes required. 1970s abd hysterectomy for fibroids, ovaries still present, ? cervix Tonsillectomy  Family History: Reviewed history from 09/01/2006 and no changes required. father died age 4 CHF, CVA mother died 26s asthma 4 sisters and 5 brothers: arthritis MGF: DM MGM: colon cancer  Social History: Reviewed history from 09/01/2006 and no changes required. 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 EOMI, PERRLA.  Neck: JVP is normal. No thyromegaly.   Question bruit R carotd. Lungs: clear to auscultation. No rales no wheezes.  Heart: Regular rate and rhythm. Normal S1, S2. No S3.   No significant murmurs. PMI not displaced.  Abdomen:  Supple, nontender. Normal bowel sounds. No masses. No hepatomegaly.  Extremities:   Good distal pulses throughout. No lower extremity edema.  Musculoskeletal :moving all extremities.  Neuro:   alert and oriented x3.    Impression & Recommendations:  Problem # 1:  MYOCARDIAL PERFUSION SCAN, WITH STRESS TEST, ABNORMAL (ICD-794.39) Work up without signif CAD.  Echo today LV and RV normal.  If she has any shunt from a fistula it is minimal.  I reviewed cath and am not impressed.  I would not pursue furhter.  She is not short of breath.  Problem # 2:  CAROTID BRUIT, RIGHT, DIFFUSE (ICD-785.9) Will set up for duplex to evaluate   This will help define how aggressive to be on lipid control  Problem # 3:  HYPERTENSION (ICD-401.9) BP is adequate.  Problem # 4:  HYPERLIPIDEMIA (ICD-272.4) Not on statin now.  will f/u with carotid USN.  Other  Orders: Carotid Duplex (Carotid Duplex)   Patient Instructions: 1)  Your physician has requested that you have a carotid duplex. This test is an ultrasound of the carotid arteries in your neck. It looks at blood flow through these arteries that supply the brain with blood. Allow one hour for this exam. There are no restrictions or special instructions. we will call you with results.

## 2010-02-21 NOTE — Letter (Signed)
Summary: Cardiac Catheterization Instructions- [facility]  [facility] [employer], Main Office  [address]   [place], [place] [address]   Phone: [phone]  Fax: [phone]     06/28/2009 MRN: [id number]  [facility] [address] [place], [place]  [address]  Dear Ms. ARIEL,   You are scheduled for a Cardiac Catheterization on _6/10/2011________ with Dr.__STUCKEY____________  Please arrive to the 1st floor of the Heart and Vascular Center at [facility] at _730____ am / on the day of your procedure. Please do not arrive before 6:30 a.m. Call the Heart and Vascular Center at [phone] if you are unable to make your appointmnet. The Code to get into the parking garage under the building is_0100_______. Take the elevators to the 1st floor. You must have someone to drive you home. Someone must be with you for the first 24 hours after you arrive home. Please wear clothes that are easy to get on and off and wear slip-on shoes. Do not eat or drink after midnight except water with your medications that morning. Bring all your medications and current insurance cards with you.  __x_ DO NOT take these medications before your procedure: _______MAXZIDE_________________________________________________________  ___ Make sure you take your aspirin.  ___ You may take ALL of your medications with water that morning. ________________________________________________________________________________________________________________________________  ___ DO NOT take ANY medications before your procedure.  ___ Pre-med instructions:  ____see print out med sheet____________________________________________________________________________________________________________________________  The usual length of stay after your procedure is 2 to 3 hours. This can vary.  If you have any questions, please call the office at the number listed above.   Layne Benton, RN, BSN

## 2010-02-21 NOTE — Progress Notes (Signed)
Summary: abn myoview  Phone Note Outgoing Call   Call placed by: Meredith Staggers, RN,  June 27, 2009 6:17 PM Call placed to: Patient Summary of Call: per Dr Eden Emms pt had abn myoview needs to see DOD tom ASAP and plan for possible cath tom afternoon, pt is aware and is sch to see Dr Tenny Craw tom 6/9 at 10:30am, pt is aware to have clear liquid breakfast and nothing after 7am in preparation for heart cath

## 2010-02-21 NOTE — Progress Notes (Signed)
Summary: Nuclear Pre-Procedure  Phone Note Outgoing Call Call back at [facility] Phone [phone]   Call placed by: Stanton Kidney, EMT-P,  June 26, 2009 2:50 PM Action Taken: Phone Call Completed Summary of Call: Left message with information on Myoview Information Sheet (see scanned document for details).     Nuclear Med Background Indications for Stress Test: Evaluation for Ischemia, Surgical Clearance  Indications Comments: Pending TKR  History: Asthma      Nuclear Pre-Procedure Cardiac Risk Factors: Hypertension, Lipids, NIDDM Height (in): 64

## 2010-02-21 NOTE — Assessment & Plan Note (Signed)
Summary: F/U DLO   Vital Signs:  Patient profile:   65 year old female Weight:      202 pounds Temp:     98.2 degrees F oral Pulse rate:   68 / minute Pulse rhythm:   regular BP sitting:   138 / 80  (left arm) Cuff size:   large  Vitals Entered By: Lowella Petties CMA (Jun 08, 2009 12:07 PM) CC: follow-up visit, Hypertension Management   History of Present Illness: No further foot pain.Marland Kitchensince working on cherries and decreaseing fried foods, shrimp in diet.  Next month has TKR surgery planned. Needs preoperative exam.    Leg cramps and varicose vins.  improved with compression hose. Stopped simvastain several months ago for leg pain.   DM, unclear control. Not checking blood sugars at home.   Hypertension History:      She complains of peripheral edema, but denies headache, chest pain, palpitations, dyspnea with exertion, PND, visual symptoms, and neurologic problems.  BP at home 117/79.        Positive major cardiovascular risk factors include female age 85 years old or older, diabetes, hyperlipidemia, and hypertension.  Negative major cardiovascular risk factors include non-tobacco-user status.        Further assessment for target organ damage reveals no history of ASHD, cardiac end-organ damage (CHF/LVH), stroke/TIA, peripheral vascular disease, renal insufficiency, or hypertensive retinopathy.     Problems Prior to Update: 1)  Gout, Unspecified  (ICD-274.9) 2)  Routine Gynecological Examination  (ICD-V72.31) 3)  Well Woman  (ICD-V70.0) 4)  Oth Prolaps Vag Walls w/o Mention Utern Prolaps  (ICD-618.09) 5)  Special Screening For Osteoporosis  (ICD-V82.81) 6)  Other Screening Mammogram  (ICD-V76.12) 7)  Gastritis  (ICD-535.50) 8)  Foot Pain, Right  (ICD-729.5) 9)  Aodm  (ICD-250.00) 10)  Hypertension  (ICD-401.9) 11)  Hyperlipidemia  (ICD-272.4) 12)  Allergic Rhinitis  (ICD-477.9) 13)  Asthma  (ICD-493.90) 14)  Osteoarthritis  (ICD-715.90)  Current Medications  (verified): 1)  Maxzide 75-50 Mg  Tabs (Triamterene-Hctz) .... 1/2 Tab Once Daily. 2)  Sulindac 200 Mg  Tabs (Sulindac) .Marland Kitchen.. 1 By Mouth Two Times A Day As Needed Arthritis 3)  Lisinopril 20 Mg Tabs (Lisinopril) .... Take One By Mouth Daily  Allergies (verified): No Known Drug Allergies  Past History:  Past medical, surgical, family and social histories (including risk factors) reviewed, and no changes noted (except as noted below).  Past Medical History: Reviewed history from 09/01/2006 and no changes required. Osteoarthritis Asthma Allergic rhinitis Hyperlipidemia Hypertension  Past Surgical History: Reviewed history from 09/01/2006 and no changes required. 1970s abd hysterectomy for fibroids, ovaries still present, ? cervix Tonsillectomy  Family History: Reviewed history from 09/01/2006 and no changes required. father died age 80 CHF, CVA mother died 41s asthma 4 sisters and 5 brothers: arthritis MGF: DM MGM: colon cancer  Social History: Reviewed history from 09/01/2006 and no changes required. Occupation:  semiretired, babysitter Married x 42 Never Smoked Alcohol use-no Drug use-no Regular exercise-yes, walks, water aerobics Diet: healthy, veggies, fruit, avoid salts  Review of Systems General:  Denies fatigue and fever. CV:  Denies chest pain or discomfort, palpitations, and swelling of feet. Resp:  Denies chest pain with inspiration, shortness of breath, sputum productive, and wheezing. GI:  Denies abdominal pain and bloody stools. GU:  Denies dysuria.  Physical Exam  General:  overweihgt female iNNAD Eyes:  No corneal or conjunctival inflammation noted. EOMI. Perrla. Funduscopic exam benign, without hemorrhages, exudates or papilledema. Vision grossly normal.  Ears:  External ear exam shows no significant lesions or deformities.  Otoscopic examination reveals clear canals, tympanic membranes are intact bilaterally without bulging, retraction, inflammation  or discharge. Hearing is grossly normal bilaterally. Nose:  External nasal examination shows no deformity or inflammation. Nasal mucosa are pink and moist without lesions or exudates. Mouth:  Oral mucosa and oropharynx without lesions or exudates.  Teeth in good repair. Neck:  no carotid bruit or thyromegaly no cervical or supraclavicular lymphadenopathy  Lungs:  Normal respiratory effort, chest expands symmetrically. Lungs are clear to auscultation, no crackles or wheezes. Heart:  Normal rate and regular rhythm. S1 and S2 normal without gallop, murmur, click, rub or other extra sounds. Abdomen:  Bowel sounds positive,abdomen soft and non-tender without masses, organomegaly or hernias noted. Pulses:  R and L posterior tibial pulses are full and equal bilaterally  Extremities:  trace left pedal edema and trace right pedal edema.   Skin:  Intact without suspicious lesions or rashes Psych:  Cognition and judgment appear intact. Alert and cooperative with normal attention span and concentration. No apparent delusions, illusions, hallucinations   Impression & Recommendations:  Problem # 1:  PREOPERATIVE EXAMINATION (ICD-V72.84) High risk pt...DM, chol, HTN, metabolic syndrome...planning at moderate risk surgery. never had cardiac evaluation in past. Currently asymptomatic, EKG NSR, no ST changes, no Q waves. Given risk recommended cardiolyte prior to clearance for surgery.  No pulmonary restrictions.  Given obesity.Marland Kitchendefinately recommend incentive spirometry and DVT prophylaxsis. Due for lab eval.  Orders: EKG w/ Interpretation (93000) Cardiolite (Cardiolite)  Problem # 2:  GOUT, UNSPECIFIED (ICD-274.9) Well controlled. Continue current medication.  Her updated medication list for this problem includes:    Sulindac 200 Mg Tabs (Sulindac) .Marland Kitchen... 1 by mouth two times a day as needed arthritis  Problem # 3:  AODM (ICD-250.00) Well controlled. Continue current medication.  Her updated medication  list for this problem includes:    Lisinopril 20 Mg Tabs (Lisinopril) .Marland Kitchen... Take one by mouth daily  Labs Reviewed: Creat: 0.9 (12/07/2008)     Last Eye Exam: normal (11/21/2006) Reviewed HgBA1c results: 5.9 (12/07/2008)  5.9 (04/06/2008)  Problem # 4:  HYPERLIPIDEMIA (ICD-272.4) Well controlled. Continue current medication.  The following medications were removed from the medication list:    Simvastatin 40 Mg Tabs (Simvastatin) .Marland Kitchen... Take 1 tablet by mouth once a day  Labs Reviewed: SGOT: 20 (12/07/2008)   SGPT: 16 (12/07/2008)  Lipid Goals: Chol Goal: 200 (09/01/2006)   HDL Goal: 40 (09/01/2006)   LDL Goal: 100 (10/11/2007)   TG Goal: 150 (09/01/2006)  Prior 10 Yr Risk Heart Disease: Not enough information (04/12/2008)   HDL:66.70 (12/07/2008), 58.70 (04/06/2008)  LDL:DEL (01/03/2008), DEL (10/07/2007)  Chol:224 (12/07/2008), 235 (04/06/2008)  Trig:92.0 (12/07/2008), 108.0 (04/06/2008)  Problem # 5:  HYPERTENSION (ICD-401.9) Well controlled. Continue current medication.  Her updated medication list for this problem includes:    Maxzide 75-50 Mg Tabs (Triamterene-hctz) .Marland Kitchen... 1/2 tab once daily.    Lisinopril 20 Mg Tabs (Lisinopril) .Marland Kitchen... Take one by mouth daily  BP today: 138/80 Prior BP: 120/80 (12/07/2008)  Prior 10 Yr Risk Heart Disease: Not enough information (04/12/2008)  Labs Reviewed: K+: 4.0 (12/07/2008) Creat: : 0.9 (12/07/2008)   Chol: 224 (12/07/2008)   HDL: 66.70 (12/07/2008)   LDL: DEL (01/03/2008)   TG: 92.0 (12/07/2008)  Complete Medication List: 1)  Maxzide 75-50 Mg Tabs (Triamterene-hctz) .... 1/2 tab once daily. 2)  Sulindac 200 Mg Tabs (Sulindac) .Marland Kitchen.. 1 by mouth two times a day as needed  arthritis 3)  Lisinopril 20 Mg Tabs (Lisinopril) .... Take one by mouth daily  Hypertension Assessment/Plan:      The patient's hypertensive risk group is category C: Target organ damage and/or diabetes.  Today's blood pressure is 138/80.  Her blood pressure goal is <  130/80.  Patient Instructions: 1)  Return for fasting lipids, CMET, A1C, microalbumin Dx 250.00, uric acid Dx 274.9, cbc TSH Dx 401.1 2)  Scheduled CPX in 6 months...with fasting labs orior Dx 250.00 A1C, CMET, lipids, uric acid Dx 274.9 3)  Referral Appointment Information 4)  Day/Date: 5)  Time: 6)  Place/MD: 7)  Address: 8)  Phone/Fax: 9)  Patient given appointment information. Information/Orders faxed/mailed.   Prescriptions: LISINOPRIL 20 MG TABS (LISINOPRIL) take one by mouth daily  #90 x 3   Entered and Authorized by:   Kerby Nora MD   Signed by:   Kerby Nora MD on 06/08/2009   Method used:   Electronically to        [facility] [id number][facility] (retail)       [address] [place], [place]  [address]       Ph: [phone] or [phone]       Fax: [phone]   RxID:   [id number] MAXZIDE 75-50 MG  TABS (TRIAMTERENE-HCTZ) 1/2 tab once daily.  #90 x 3   Entered and Authorized by:   Kerby Nora MD   Signed by:   Kerby Nora MD on 06/08/2009   Method used:   Electronically to        [facility] [id number][facility] (retail)       [address] [place], [place]  [address]       Ph: [phone] or [phone]       Fax: [phone]   RxID:   [id number]   Prior Medications (reviewed today): MAXZIDE 75-50 MG  TABS (TRIAMTERENE-HCTZ) 1/2 tab once daily. SULINDAC 200 MG  TABS (SULINDAC) 1 by mouth two times a day as needed arthritis LISINOPRIL 20 MG TABS (LISINOPRIL) take one by mouth daily Current Allergies (reviewed today): No known allergies   Last PAP:  normal (06/20/2008 9:34:04 AM) PAP Next Due:  2 yr  Appended Document: F/U DLO Fax copy of OV note and upcoming labs to Dr. Frederich Chick.  Appended Document: F/U DLO notes faxed to dr carter in ortho 06-13-2009 8:42am.Heather Humberto Leep CMA

## 2010-02-21 NOTE — Progress Notes (Signed)
Summary: cards referral  Phone Note From Other Clinic   Caller: Nurse Call For: Dr. Ermalene Searing Summary of Call: Nurse called said that stress test was abnormal and patient would need referral to cardiology.Consuello Masse CMA  Initial call taken by: Benny Lennert CMA Duncan Dull),  June 27, 2009 2:09 PM  New Problems: MYOCARDIAL PERFUSION SCAN, WITH STRESS TEST, ABNORMAL (ICD-794.39)   New Problems: MYOCARDIAL PERFUSION SCAN, WITH STRESS TEST, ABNORMAL (ICD-794.39)

## 2010-02-21 NOTE — Miscellaneous (Signed)
Summary: Appointment Canceled  Appointment status changed to canceled by [employer] on 08/17/2009 3:44 PM.  Cancellation Comments --------------------- echo/jss  Appointment Information ----------------------- Appt Type:  CARDIOLOGY ANCILLARY VISIT      Date:  Tuesday, October 02, 2009      Time:  3:00 PM for 60 min   Urgency:  Routine   Made By:  Pearson Grippe  To Visit:  [id number]    Reason:  echo/jss  Appt Comments ------------- -- 08/17/09 15:44: ([facility]) CANCELED -- echo/jss -- 08/17/09 15:42: ([facility]) BOOKED -- Routine CARDIOLOGY ANCILLARY VISIT at 10/02/2009 3:00 PM for 60 min echo/jss

## 2010-02-21 NOTE — Assessment & Plan Note (Signed)
Summary: Cardiology Nuclear Study  Nuclear Med Background Indications for Stress Test: Evaluation for Ischemia, Surgical Clearance  Indications Comments: Pending (R) TKR on 07/11/09 by Dr. Myrtie Neither  History: Asthma  History Comments: NO DOCUMENTED CAD  Symptoms: Palpitations, Rapid HR    Nuclear Pre-Procedure Cardiac Risk Factors: Hypertension, Lipids, NIDDM, Obesity Caffeine/Decaff Intake: None NPO After: 6:00 PM Lungs: Clear.  O2 Sat 97% on RA. IV 0.9% NS with Angio Cath: 24g     IV Site: (R) Hand IV Started by: Irean Hong RN Chest Size (in) 40     Cup Size C     Height (in): 60 Weight (lb): 202 BMI: 39.59  Nuclear Med Study 1 or 2 day study:  1 day     Stress Test Type:  Eugenie Birks Reading MD:  Charlton Haws, MD     Referring MD:  Kerby Nora, MD Resting Radionuclide:  Technetium 39m Tetrofosmin     Resting Radionuclide Dose:  11 mCi  Stress Radionuclide:  Technetium 69m Tetrofosmin     Stress Radionuclide Dose:  33 mCi   Stress Protocol   Lexiscan: 0.4 mg   Stress Test Technologist:  Rea [employer] CMA-N     Nuclear Technologist:  Domenic Polite CNMT  Rest Procedure  Myocardial perfusion imaging was performed at rest 45 minutes following the intravenous administration of Myoview Technetium 86m Tetrofosmin.  Stress Procedure  The patient received IV Lexiscan 0.4 mg over 15-seconds.  Myoview injected at 30-seconds.  There were no significant changes with infusion, other than occasional PVC's.  She did c/o chest pressure with infusion.  Quantitative spect images were obtained after a 45 minute delay.  QPS Raw Data Images:  Normal; no motion artifact; normal heart/lung ratio. Stress Images:  Anteroapical ischemia Rest Images:  Normal homogeneous uptake in all areas of the myocardium. Subtraction (SDS):  Apical ischemia Transient Ischemic Dilatation:  1.12  (Normal <1.22)  Lung/Heart Ratio:  .24  (Normal <0.45)  Quantitative Gated Spect Images QGS EDV:  84  ml QGS ESV:  24 ml QGS EF:  72 % QGS cine images:  normal  Findings High risk nuclear study Evidence for anterior (septal apical) ischemia      Overall Impression  Exercise Capacity: Lexiscan BP Response: Normal blood pressure response. Clinical Symptoms: Chest Pressure ECG Impression: No significant ST segment change suggestive of ischemia. Overall Impression: Significant anteroseptal, anterior and apical ishcemia Overall Impression Comments: Patient apparantly was not seen in office after study.  Needs to be seen by DOD in [facility] tomorrow and set up for cath.  Appended Document: Cardiology Nuclear Study Referral made to cardiology for cath.

## 2010-03-19 ENCOUNTER — Telehealth: Payer: Self-pay | Admitting: Family Medicine

## 2010-03-20 ENCOUNTER — Encounter: Payer: Self-pay | Admitting: Family Medicine

## 2010-03-20 ENCOUNTER — Ambulatory Visit (INDEPENDENT_AMBULATORY_CARE_PROVIDER_SITE_OTHER): Payer: Medicare Other

## 2010-03-20 DIAGNOSIS — Z111 Encounter for screening for respiratory tuberculosis: Secondary | ICD-10-CM

## 2010-03-21 ENCOUNTER — Other Ambulatory Visit: Payer: Self-pay | Admitting: Family Medicine

## 2010-03-21 DIAGNOSIS — Z1231 Encounter for screening mammogram for malignant neoplasm of breast: Secondary | ICD-10-CM

## 2010-03-28 NOTE — Assessment & Plan Note (Signed)
Summary: TB SKIN TEST APPROVED PER RE[facility]-CALL TRANSFERED  Nurse Visit   Allergies: No Known Drug Allergies  Immunizations Administered:  PPD Skin Test:    Vaccine Type: PPD    Site: right forearm    Mfr: Sanofi Pasteur    Dose: 0.1 ml    Route: ID    Given by: Delilah Shan CMA (AAMA)    Exp. Date: 09/21/2011    Lot #: [id number]  Orders Added: 1)  TB Skin Test [86580] 2)  Admin 1st Vaccine [90471]  Appended Document: TB SKIN TEST APPROVED PER RE[facility]-CALL TRANSFERED    Nurse Visit   Allergies: No Known Drug Allergies  PPD Results    Date of reading: 03/22/2010    Results: 0mm    Interpretation: negative

## 2010-03-28 NOTE — Progress Notes (Signed)
Summary: form for social services   Phone Note Call from Patient   Caller: Patient Call For: Kerby Nora MD Summary of Call: Patient dropped forms off to be filled out for Social Services. Form is on your desk. Initial call taken by: Melody Comas,  March 19, 2010 9:07 AM  Follow-up for Phone Call        From completed, but pt needs PPD prior to full completion. Please have her schedule.  Form in my outbox.  Follow-up by: Kerby Nora MD,  March 19, 2010 9:16 AM  Additional Follow-up for Phone Call Additional follow up Details #1::        Patient coming in for tb skin test Additional Follow-up by: Benny Lennert CMA (AAMA),  March 20, 2010 9:02 AM

## 2010-04-05 ENCOUNTER — Ambulatory Visit (HOSPITAL_COMMUNITY)
Admission: RE | Admit: 2010-04-05 | Discharge: 2010-04-05 | Disposition: A | Discharge status: Home / Self Care | Payer: Medicare Other | Source: Ambulatory Visit | Attending: Family Medicine | Admitting: Family Medicine

## 2010-04-05 DIAGNOSIS — Z1231 Encounter for screening mammogram for malignant neoplasm of breast: Secondary | ICD-10-CM

## 2010-04-07 LAB — APTT: aPTT: 26 seconds (ref 24–37)

## 2010-04-07 LAB — CBC
Hemoglobin: 11 g/dL — ABNORMAL LOW (ref 12.0–15.0)
MCH: 29.9 pg (ref 26.0–34.0)
MCHC: 33.4 g/dL (ref 30.0–36.0)
MCV: 87.3 fL (ref 78.0–100.0)
MCV: 88.3 fL (ref 78.0–100.0)
MCV: 88.3 fL (ref 78.0–100.0)
Platelets: 205 10*3/uL (ref 150–400)
RBC: 3.32 MIL/uL — ABNORMAL LOW (ref 3.87–5.11)
RBC: 3.67 MIL/uL — ABNORMAL LOW (ref 3.87–5.11)
RDW: 13.2 % (ref 11.5–15.5)
WBC: 20.1 10*3/uL — ABNORMAL HIGH (ref 4.0–10.5)

## 2010-04-07 LAB — COMPREHENSIVE METABOLIC PANEL
ALT: 15 U/L (ref 0–35)
AST: 19 U/L (ref 0–37)
Calcium: 10.4 mg/dL (ref 8.4–10.5)
Creatinine, Ser: 0.94 mg/dL (ref 0.4–1.2)
GFR calc Af Amer: >60 mL/min (ref >60)
GFR calc non Af Amer: >60 mL/min (ref >60)
Sodium: 139 mEq/L (ref 135–145)
Total Protein: 7.2 g/dL (ref 6.0–8.3)

## 2010-04-07 LAB — URINALYSIS, ROUTINE W REFLEX MICROSCOPIC
Bilirubin Urine: NEGATIVE
Ketones, ur: NEGATIVE mg/dL
Nitrite: NEGATIVE
Protein, ur: NEGATIVE mg/dL
Urobilinogen, UA: 0.2 mg/dL (ref 0.0–1.0)

## 2010-04-07 LAB — PROTIME-INR
INR: 1.54 — ABNORMAL HIGH (ref 0.00–1.49)
Prothrombin Time: 12.6 seconds (ref 11.6–15.2)
Prothrombin Time: 13.7 seconds (ref 11.6–15.2)
Prothrombin Time: 18.4 seconds — ABNORMAL HIGH (ref 11.6–15.2)

## 2010-04-08 ENCOUNTER — Encounter (HOSPITAL_COMMUNITY)
Admission: RE | Admit: 2010-04-08 | Discharge: 2010-04-08 | Disposition: A | Discharge status: Home / Self Care | Payer: Medicare Other | Source: Ambulatory Visit | Attending: Orthopedic Surgery | Admitting: Orthopedic Surgery

## 2010-04-08 DIAGNOSIS — Z01812 Encounter for preprocedural laboratory examination: Secondary | ICD-10-CM | POA: Insufficient documentation

## 2010-04-08 LAB — APTT: aPTT: 26 seconds (ref 24–37)

## 2010-04-08 LAB — CBC
MCH: 28.5 pg (ref 26.0–34.0)
MCHC: 33 g/dL (ref 30.0–36.0)
MCV: 86.5 fL (ref 78.0–100.0)
Platelets: 287 10*3/uL (ref 150–400)
RDW: 13 % (ref 11.5–15.5)
WBC: 8.4 10*3/uL (ref 4.0–10.5)

## 2010-04-08 LAB — TYPE AND SCREEN: Antibody Screen: NEGATIVE

## 2010-04-08 LAB — COMPREHENSIVE METABOLIC PANEL
ALT: 14 U/L (ref 0–35)
Albumin: 3.9 g/dL (ref 3.5–5.2)
Alkaline Phosphatase: 56 U/L (ref 39–117)
Calcium: 10.1 mg/dL (ref 8.4–10.5)
GFR calc Af Amer: >60 mL/min (ref >60)
Potassium: 4 mEq/L (ref 3.5–5.1)
Sodium: 141 mEq/L (ref 135–145)
Total Protein: 7 g/dL (ref 6.0–8.3)

## 2010-04-08 LAB — URINALYSIS, ROUTINE W REFLEX MICROSCOPIC
Bilirubin Urine: NEGATIVE
Ketones, ur: NEGATIVE mg/dL
Nitrite: NEGATIVE
Protein, ur: NEGATIVE mg/dL
Specific Gravity, Urine: 1.019 (ref 1.005–1.030)
Urobilinogen, UA: 0.2 mg/dL (ref 0.0–1.0)

## 2010-04-08 LAB — PROTIME-INR: INR: 0.92 (ref 0.00–1.49)

## 2010-04-11 ENCOUNTER — Inpatient Hospital Stay (HOSPITAL_COMMUNITY)
Admission: RE | Admit: 2010-04-11 | Discharge: 2010-04-15 | DRG: 470 | Disposition: A | Discharge status: Home Health | Payer: Medicare Other | Source: Ambulatory Visit | Attending: Orthopedic Surgery | Admitting: Orthopedic Surgery

## 2010-04-11 DIAGNOSIS — J45909 Unspecified asthma, uncomplicated: Secondary | ICD-10-CM | POA: Diagnosis present

## 2010-04-11 DIAGNOSIS — I10 Essential (primary) hypertension: Secondary | ICD-10-CM | POA: Diagnosis present

## 2010-04-11 DIAGNOSIS — Z96659 Presence of unspecified artificial knee joint: Secondary | ICD-10-CM

## 2010-04-11 DIAGNOSIS — M1710 Unilateral primary osteoarthritis, unspecified knee: Principal | ICD-10-CM | POA: Diagnosis present

## 2010-04-11 LAB — GLUCOSE, CAPILLARY
Glucose-Capillary: 115 mg/dL — ABNORMAL HIGH (ref 70–99)
Glucose-Capillary: 122 mg/dL — ABNORMAL HIGH (ref 70–99)

## 2010-04-12 LAB — PROTIME-INR
INR: 1.07 (ref 0.00–1.49)
Prothrombin Time: 14.1 seconds (ref 11.6–15.2)

## 2010-04-12 LAB — CBC
HCT: 34.1 % — ABNORMAL LOW (ref 36.0–46.0)
MCH: 28.4 pg (ref 26.0–34.0)
MCHC: 33.1 g/dL (ref 30.0–36.0)
RDW: 13.1 % (ref 11.5–15.5)

## 2010-04-13 LAB — CBC
HCT: 33.7 % — ABNORMAL LOW (ref 36.0–46.0)
Hemoglobin: 11.1 g/dL — ABNORMAL LOW (ref 12.0–15.0)
MCH: 28 pg (ref 26.0–34.0)
MCHC: 32.9 g/dL (ref 30.0–36.0)
RDW: 13 % (ref 11.5–15.5)

## 2010-04-13 LAB — PROTIME-INR: INR: 1.49 (ref 0.00–1.49)

## 2010-04-14 LAB — PROTIME-INR
INR: 1.55 — ABNORMAL HIGH (ref 0.00–1.49)
Prothrombin Time: 18.8 seconds — ABNORMAL HIGH (ref 11.6–15.2)

## 2010-04-15 LAB — PROTIME-INR
INR: 1.85 — ABNORMAL HIGH (ref 0.00–1.49)
Prothrombin Time: 21.5 seconds — ABNORMAL HIGH (ref 11.6–15.2)

## 2010-04-30 NOTE — Op Note (Signed)
Melanie Lang, Melanie Lang NO.:  [id number]  MEDICAL RECORD NO.:  [id number]           PATIENT TYPE:  I  LOCATION:  5019                         FACILITY:  [facility]  PHYSICIAN:  Myrtie Neither, MD      DATE OF BIRTH:  1946-01-18  DATE OF PROCEDURE:  04/11/2010 DATE OF DISCHARGE:                              OPERATIVE REPORT   PREOPERATIVE DIAGNOSIS:  Degenerative joint disease, left knee.  POSTOPERATIVE DIAGNOSIS:  Degenerative joint disease, left knee.  ANESTHESIA:  General.  PROCEDURE:  Left total knee arthroplasty, Biomet implant.  The patient was taken to the operating room after given adequate preop medications, given general anesthesia and intubated.  Left lower extremity was prepped with DuraPrep and draped in sterile manner. Tourniquet and Bovie used for hemostasis.  Anterior midline incision made over the left knee going through skin and subcutaneous tissue from the quadriceps down to the tibial tuberosity.  A medial paramedian incision was made into the capsule extending from the quadriceps down the tibial tuberosity.  Patella was reflected laterally.  Osteophyte was removed from the patella, femur, and tibia.  Soft tissue release and resection was done due to varus deformity.  This allowed balancing of the joint.  With the knee in flexed position, tibial cutting jig was set at 10 mm and tibial plateau surface was resected.  Next, reaming down the femoral canal was done and distal femoral cutting jig was put in place at 3 degrees, distal femoral cut was made.  Sizing of the femur was found to be 62.5 mm.  Appropriate cutting jig was put in place. Anterior and posterior cuts and chamfer cuts were made.  Further osteophytes were removed from the femur.  Trial fitting of the femur was found to be a very good fit.  Attention was then turned to the tibia. Tibia was measured at 67 mm.  Appropriate cutting jig was put in place with use of guidewire followed by  cutting of the tibial surface and impacting it.  Tibial trial was put in place first with a 10 mm poly. There appeared to be full flexion, full extension and a little laxity on medial and lateral stress.  A 12 mm was found to fit very good.  Full extension and full flexion, good medial and lateral stability. Attention was then turned to the patella which was size of a large 37 mm.  Appropriate cutting jig was put in place and the tibial surface was prepared.  With all 3 trial components put in place, there was full extension, full flexion, good medial and lateral stability, no subluxing of the patella, very good gliding.  Components were removed.  Irrigation was then done.  Methyl methacrylate was then mixed.  The patella and tibial components were cemented and the femoral component was press fit. After removal of excess methyl methacrylate, again tibial bearing surface was then tried, 12 mm was found to be the most stable.  Final 12- mm poly was put in place and locked with a key.  Tourniquet was let down.  Hemostasis obtained.  Wound closure was then done with 0 Vicryl for the fascia, 2-0 for  subcutaneous and skin staples for the skin.  Compressive dressing was applied.  Knee immobilizer applied.  The patient tolerated the procedure quite well and returned to the recovery room in stable and satisfactory condition.     Myrtie Neither, MD     AC/MEDQ  D:  04/14/2010  T:  04/15/2010  Job:  [id number]  Electronically Signed by Myrtie Neither MD on 04/30/2010 12:59:54 PM

## 2010-04-30 NOTE — H&P (Signed)
  [name], Lang NO.:  [id number]  MEDICAL RECORD NO.:  [id number]           PATIENT TYPE:  I  LOCATION:  5019                         FACILITY:  [facility]  PHYSICIAN:  Myrtie Neither, MD      DATE OF BIRTH:  [birth year]  DATE OF ADMISSION:  04/11/2010 DATE OF DISCHARGE:                             HISTORY & PHYSICAL   CHIEF COMPLAINT:  Left knee pain.  HISTORY OF PRESENT ILLNESS:  This is a 65 year old who had been following in the office over the past several years for degenerative joint disease of bilateral knees.  The patient had the right knee done year and half ago and presently to have the left knee done due to persistent pain, deformity, stiffness and difficulty ambulating.  PAST MEDICAL HISTORY:  Hypertension, degenerative joint disease, previous right total knee arthroplasty and history of asthma.  REVIEW OF SYSTEMS:  Basically as in history of present illness.  No cardiac or recent respiratory symptoms, no urinary or bowel symptoms.  FAMILY HISTORY:  Dad with hypertension.  SOCIAL HISTORY:  No use of alcohol, tobacco or illegal drugs.  MEDICATIONS: 1. Hydrocodone 10/650. 2. Triamterene and hydrochlorothiazide 75/50. 3. Sulindac 200 mg b.i.d. 4. Lisinopril 20 mg daily.  PHYSICAL EXAMINATION:  GENERAL:  Alert and oriented, in no acute distress. VITAL SIGNS:  Height 65 inches, weight 91.8 kg, temperature 98, blood pressure 125/81, pulse 85, respirations 20, O2 saturation 100%. HEENT:  Normocephalic.  Eyes, conjunctivae clear. NECK:  Supple. CHEST:  Clear. CARDIAC:  S1 and S2 regular. EXTREMITIES:  Left knee genu varum with limited range of motion, crepitus both medial and lateral and patellofemoral joint.  Negative Homans test, +2 effusion.  X-rays reveal loss of medial and lateral compartment with osteophyte sclerosis.  IMPRESSION:  Degenerative joint disease, left knee.  PLAN:  Left total knee arthroplasty.     Myrtie Neither,  MD     AC/MEDQ  D:  04/14/2010  T:  04/15/2010  Job:  [id number]  Electronically Signed by Myrtie Neither MD on 04/30/2010 12:59:59 PM

## 2010-04-30 NOTE — Discharge Summary (Signed)
  NAMEALEXI, Melanie Lang NO.:  [id number]  MEDICAL RECORD NO.:  [id number]           PATIENT TYPE:  I  LOCATION:  5019                         FACILITY:  [facility]  PHYSICIAN:  Myrtie Neither, MD      DATE OF BIRTH:  1945-09-09  DATE OF ADMISSION:  04/11/2010 DATE OF DISCHARGE:  04/15/2010                              DISCHARGE SUMMARY   ADMITTING DIAGNOSES: 1. Degenerative joint disease, left knee. 2. Hypertension. 3. History of asthma.  DISCHARGE DIAGNOSES: 1. Degenerative joint disease, left knee. 2. Hypertension. 3. History of asthma.  COMPLICATIONS:  None.  INFECTIONS:  None.  OPERATION:  Left total knee arthroplasty.  PERTINENT HISTORY:  This is a 65 year old female followed in the office for degenerative joint disease of the left knee, progressive worsening symptoms, treated with anti-inflammatories, and therapeutic injections. The patient's pertinent physical exam of the left knee with varus deformity with crepitus medial and lateral compartment, limited range of motion is limited, +2 effusion, negative Homans' test.  X-rays revealed loss of joint space both medial and lateral compartment and sclerosis of the osteophytes.  The patient underwent preop laboratory, CBC, EKG, chest x-ray, PT/PTT, platelet count, CMET, UA.  The patient's labs were found to be stable to undergo surgery.  The patient underwent left total knee arthroplasty.  Postop course was fairly benign.  Started on physical therapy and anticoagulation and IV antibiotics.  The patient progressed with partial weightbearing on the left side with use of walker quite well, remained afebrile, and tolerating use of CPM.  The patient progressed well enough to be discharged with home health and PT arranged, continued on Coumadin, INR checks, ferrous sulfate 325 mg b.i.d., Robaxin 500 mg b.i.d., and Percocet one to two q.4 p.r.n. pain. The patient has home health and PT arranged and would be seen  back in the office in 1 week.  The patient is being discharged in stable and satisfactory condition.     Myrtie Neither, MD    AC/MEDQ  D:  04/14/2010  T:  04/15/2010  Job:  [id number]  Electronically Signed by Myrtie Neither MD on 04/30/2010 12:59:49 PM

## 2010-05-01 ENCOUNTER — Encounter: Payer: Self-pay | Admitting: Orthopedic Surgery

## 2010-05-21 ENCOUNTER — Encounter: Payer: Self-pay | Admitting: Orthopedic Surgery

## 2010-05-28 ENCOUNTER — Other Ambulatory Visit: Payer: Self-pay | Admitting: Family Medicine

## 2010-05-28 DIAGNOSIS — M109 Gout, unspecified: Secondary | ICD-10-CM

## 2010-05-28 DIAGNOSIS — E7800 Pure hypercholesterolemia, unspecified: Secondary | ICD-10-CM

## 2010-05-30 ENCOUNTER — Other Ambulatory Visit: Payer: Self-pay

## 2010-05-30 ENCOUNTER — Other Ambulatory Visit (INDEPENDENT_AMBULATORY_CARE_PROVIDER_SITE_OTHER): Payer: Medicare Other | Admitting: Family Medicine

## 2010-05-30 DIAGNOSIS — E119 Type 2 diabetes mellitus without complications: Secondary | ICD-10-CM

## 2010-05-30 DIAGNOSIS — M109 Gout, unspecified: Secondary | ICD-10-CM

## 2010-05-30 DIAGNOSIS — E7800 Pure hypercholesterolemia, unspecified: Secondary | ICD-10-CM

## 2010-05-30 LAB — HEMOGLOBIN A1C: Hgb A1c MFr Bld: 5.6 % (ref 4.6–6.5)

## 2010-05-30 LAB — COMPREHENSIVE METABOLIC PANEL
ALT: 12 U/L (ref 0–35)
Albumin: 3.8 g/dL (ref 3.5–5.2)
CO2: 26 mEq/L (ref 19–32)
Chloride: 106 mEq/L (ref 96–112)
GFR: 81.91 mL/min (ref >60.00)
Glucose, Bld: 101 mg/dL — ABNORMAL HIGH (ref 70–99)
Potassium: 4.2 mEq/L (ref 3.5–5.1)
Sodium: 141 mEq/L (ref 135–145)
Total Protein: 7.2 g/dL (ref 6.0–8.3)

## 2010-05-30 LAB — URIC ACID: Uric Acid, Serum: 7.6 mg/dL — ABNORMAL HIGH (ref 2.4–7.0)

## 2010-05-30 LAB — LDL CHOLESTEROL, DIRECT: Direct LDL: 118.9 mg/dL

## 2010-06-04 ENCOUNTER — Encounter: Payer: Self-pay | Admitting: Family Medicine

## 2010-06-04 NOTE — Group Therapy Note (Signed)
NAMEKRYSTAL, Melanie Lang NO.:  [id number]   MEDICAL RECORD NO.:  [id number]          PATIENT TYPE:  WOC   LOCATION:  [facility]                   FACILITY:  [facility]   PHYSICIAN:  Argentina Donovan, MD        DATE OF BIRTH:  [birth year]   DATE OF SERVICE:  07/05/2008                                  CLINIC NOTE   The patient is a 65 year old African American female gravida 4, para 4-0-  0-4 who underwent abdominal hysterectomy in her early 24s for fibroid  uterus, was fine, then following a colonoscopy not too long ago she felt  pelvic pressure.  She thought it might be due to that and then all of a  sudden she felt something at the vagina.  She came in today after she  was sent in because her family doctor said I think that you have a  prolapse there.  She comes in and she has a major vaginal wall prolapse,  second-degree, primarily with the anterior wall, but she also has a  significant rectocele although not a bulging one.  The patient has no  signs or symptoms of incontinence either of overflow nor stress  incontinence and except for the pressure when she is on her feel a lot  she does not have serious problems with this.  She is married but has  not had sexual relations in about 10 years.  It sounds as if her husband  is impotent.  She is on lisinopril, Maxzide,  dexamethasone and Senolax.  She has some bad arthritis.  We talked about pessary versus surgery.  She is going to think about that and come in in a couple weeks.  If she  desires surgery I would set her up probably with Dr. Marice Potter and if she  desires the pessary we will try her on several of those, preferably one  that she can remove and replace on her own.   IMPRESSION:  Second-degree vaginal wall prolapse, primarily anterior  wall.           ______________________________  Argentina Donovan, MD     PR/MEDQ  D:  07/05/2008  T:  07/05/2008  Job:  [id number]

## 2010-06-04 NOTE — Group Therapy Note (Signed)
NAMEASHANTEE, DEUPREE NO.:  [id number]   MEDICAL RECORD NO.:  [id number]          PATIENT TYPE:  WOC   LOCATION:  [facility]                   FACILITY:  [facility]   PHYSICIAN:  Elsie Lincoln, MD      DATE OF BIRTH:  1945/06/22   DATE OF SERVICE:                                  CLINIC NOTE   HISTORY:  The patient is a 65 year old female who has a cystocele, mild  vaginal vault prolapse and a mild rectocele.  The cystocele is  essentially the worst of her 3 pelvic organ prolapse issues.  The  patient was offered surgery versus pessary and the patient chooses  pessary.  The patient is on disability for severe carpal tunnel on the  right, some arthritis in the left wrist, 2 knee replacements and a  shoulder issue.  She does look rather mobile, so hopefully she will be  able to take the pessary in and out and clean.  Dr. Marice Potter has offered to  do a pessary fitting in a couple of weeks.  We are going to estrogenize  the vagina with Estrace 2 gm nightly for 2 weeks and Dr. Marice Potter will  assist the patient with pessary placement in 2 weeks.           ______________________________  Elsie Lincoln, MD     KL/MEDQ  D:  07/19/2008  T:  07/19/2008  Job:  [id number]

## 2010-06-07 ENCOUNTER — Ambulatory Visit (INDEPENDENT_AMBULATORY_CARE_PROVIDER_SITE_OTHER): Payer: Medicare Other | Admitting: Family Medicine

## 2010-06-07 ENCOUNTER — Encounter: Payer: Self-pay | Admitting: Family Medicine

## 2010-06-07 DIAGNOSIS — E119 Type 2 diabetes mellitus without complications: Secondary | ICD-10-CM

## 2010-06-07 DIAGNOSIS — E785 Hyperlipidemia, unspecified: Secondary | ICD-10-CM

## 2010-06-07 DIAGNOSIS — M109 Gout, unspecified: Secondary | ICD-10-CM

## 2010-06-07 DIAGNOSIS — I10 Essential (primary) hypertension: Secondary | ICD-10-CM

## 2010-06-07 LAB — HM DIABETES FOOT EXAM

## 2010-06-07 MED ORDER — TRIAMTERENE-HCTZ 75-50 MG PO TABS: 1.0000 | ORAL_TABLET | Freq: Every day | ORAL | Status: DC

## 2010-06-07 NOTE — Op Note (Signed)
Melanie Lang, Melanie Lang                       ACCOUNT NO.:  [id number]   MEDICAL RECORD NO.:  [id number]                   PATIENT TYPE:  AMB   LOCATION:  [facility]                                 FACILITY:  [facility]   PHYSICIAN:  Petra Kuba, M.D.                 DATE OF BIRTH:  11/13/1945   DATE OF PROCEDURE:  02/27/2003  DATE OF DISCHARGE:                                 OPERATIVE REPORT   PROCEDURE PERFORMED:  Colonoscopy.   ENDOSCOPIST:  Petra Kuba, M.D.   INDICATIONS FOR PROCEDURE:  Patient with history of colon polyps due for  repeat screening.  Consent was signed after the risks, benefits, methods and  options were thoroughly discussed multiple times in the past.   MEDICINES USED:  Demerol 70 mg, Versed 7 mg.   DESCRIPTION OF PROCEDURE:  Rectal inspection was pertinent for external  hemorrhoids, small.  Digital exam was negative.  A video colonoscope was  inserted and easily advanced around the colon to the cecum.  This did not  require any abdominal pressure and any physician changes.  No obvious  abnormality was seen on insertion.  The cecum was identified by the  appendiceal orifice and the ileocecal valve.  The scope was slowly  withdrawn.  The prep was adequate.  There was some liquid stool that  required washing and suctioning.  On slow withdrawal through the colon other  than a rare right-sided diverticula, no polyps, tumors, masses were seen as  we slowly withdrew back to the rectum.  In the midtransverse, previously,  polypectomy site was seen with white scarring of the wall but no other  abnormalities.  Once back in the rectum, anorectal pullthrough and  retroflexion confirmed some small hemorrhoids.  There was some scope trauma  not only to the hemorrhoids but to the rectosigmoid junction without active  bleeding, just minimal linear heme but no signs of complications.  Scope was  slowly advanced a short ways up the left side of the colon.  Air was  suctioned,  scope removed.  The patient tolerated the procedure well.  There  was minimal scope trauma as mentioned above.   ENDOSCOPIC DIAGNOSIS:  1. Internal and external hemorrhoids.  2. Rare right-sided diverticula.  3. Otherwise within normal limits to the cecum.   PLAN:  Happy to see back as needed.  Probably will repeat colon screening in  five years.  Otherwise return care to Dr. Jeannetta Nap for customary health care  maintenance to include yearly rectals and guaiacs.                                               Petra Kuba, M.D.   MEM/MEDQ  D:  02/27/2003  T:  02/27/2003  Job:  [id number]   cc:  Windle Guard, M.D.  [address]  [place], [place] [address]  Fax: [phone]

## 2010-06-07 NOTE — Progress Notes (Signed)
  Subjective:    Patient ID: Melanie Lang, female    DOB: [birth year], 65 y.o.   MRN: [id number]  HPI Diabetes:  Diet control. Not checking CBS regularly Hypoglycemic episodes: None Hyperglycemic episodes:None Feet problems:None Blood Sugars averaging: ?  eye exam within last year:Due now.  Hypertension:  Well controlled  Using medication without problems or lightheadedness: lisinopril, maxide Chest pain with exertion: NONE Edema: on left ankle due to recent knee surgery Short of breath:NONE Average home BPs: 119/79 Other issues:None  Elevated Cholesterol: On no medication. Leg cramps with pravastatin in past. Saw Dr. Tenny Craw (Cards).negative carotid dopplers per pt Other complaints:  PMH and SH reviewed.   Vital signs, Meds and allergies reviewed.  ROS: See HPI.  Otherwise nontributory.       Review of Systems     Objective:   Physical Exam  Constitutional: Vital signs are normal. She appears well-developed and well-nourished. She is cooperative.  Non-toxic appearance. She does not appear ill. No distress.  HENT:  Head: Normocephalic.  Right Ear: Hearing, tympanic membrane, external ear and ear canal normal. Tympanic membrane is not erythematous, not retracted and not bulging.  Left Ear: Hearing, tympanic membrane, external ear and ear canal normal. Tympanic membrane is not erythematous, not retracted and not bulging.  Nose: No mucosal edema or rhinorrhea. Right sinus exhibits no maxillary sinus tenderness and no frontal sinus tenderness. Left sinus exhibits no maxillary sinus tenderness and no frontal sinus tenderness.  Mouth/Throat: Uvula is midline, oropharynx is clear and moist and mucous membranes are normal.  Eyes: Conjunctivae, EOM and lids are normal. Pupils are equal, round, and reactive to light. No foreign bodies found.  Neck: Trachea normal and normal range of motion. Neck supple. Carotid bruit is not present. No mass and no thyromegaly present.    Cardiovascular: Normal rate, regular rhythm, S1 normal, S2 normal, normal heart sounds, intact distal pulses and normal pulses.  Exam reveals no gallop and no friction rub.   No murmur heard. Pulmonary/Chest: Effort normal and breath sounds normal. Not tachypneic. No respiratory distress. She has no decreased breath sounds. She has no wheezes. She has no rhonchi. She has no rales.  Abdominal: Soft. Normal appearance and bowel sounds are normal. There is no tenderness.  Musculoskeletal:       Healing scar left knee, no erythema  Neurological: She is alert.  Skin: Skin is warm, dry and intact. No rash noted.  Psychiatric: Her speech is normal and behavior is normal. Judgment and thought content normal. Her mood appears not anxious. Cognition and memory are normal. She does not exhibit a depressed mood.      Diabetic foot exam: Normal inspection except for hammer toes, bunion No skin breakdown No calluses  Normal DP pulses Normal sensation to light tough and monofilament Nails normal     Assessment & Plan:

## 2010-06-07 NOTE — Assessment & Plan Note (Signed)
Last flare of gout 1 year ago in June.  Avoiding foods high in uric acid. Not interested in medication to prevent given only one flare.

## 2010-06-07 NOTE — Assessment & Plan Note (Signed)
Inadequate control. Start red yeast rice. Recheck in 6 months. Encouraged exercise, weight loss, healthy eating habits.

## 2010-06-07 NOTE — Patient Instructions (Signed)
Red yeast rice 2400 mg daily to naturally lower cholesterol. Continue exercise and working on weight loss.

## 2010-06-07 NOTE — Assessment & Plan Note (Signed)
Well controlled. Continue current medication.  

## 2010-06-07 NOTE — Assessment & Plan Note (Signed)
Great control with diet. 

## 2010-06-19 ENCOUNTER — Other Ambulatory Visit: Payer: Self-pay | Admitting: Family Medicine

## 2010-06-21 ENCOUNTER — Encounter: Payer: Self-pay | Admitting: Orthopedic Surgery

## 2010-07-29 LAB — HM DIABETES EYE EXAM

## 2010-09-25 ENCOUNTER — Ambulatory Visit
Admission: RE | Admit: 2010-09-25 | Discharge: 2010-09-25 | Disposition: A | Discharge status: Home / Self Care | Payer: Medicare Other | Source: Ambulatory Visit | Attending: Orthopedic Surgery | Admitting: Orthopedic Surgery

## 2010-09-25 ENCOUNTER — Other Ambulatory Visit: Payer: Self-pay | Admitting: Orthopedic Surgery

## 2010-09-25 DIAGNOSIS — M25562 Pain in left knee: Secondary | ICD-10-CM

## 2010-10-25 ENCOUNTER — Encounter: Payer: Self-pay | Admitting: Family Medicine

## 2010-10-25 ENCOUNTER — Ambulatory Visit (INDEPENDENT_AMBULATORY_CARE_PROVIDER_SITE_OTHER): Payer: Medicare Other | Admitting: Family Medicine

## 2010-10-25 ENCOUNTER — Telehealth: Payer: Self-pay | Admitting: *Deleted

## 2010-10-25 VITALS — BP 120/72 | HR 55 | Temp 98.4°F | Ht 65.5 in | Wt 194.1 lb

## 2010-10-25 DIAGNOSIS — L03211 Cellulitis of face: Secondary | ICD-10-CM

## 2010-10-25 DIAGNOSIS — L0201 Cutaneous abscess of face: Secondary | ICD-10-CM

## 2010-10-25 MED ORDER — AMOXICILLIN-POT CLAVULANATE 875-125 MG PO TABS: 1.0000 | ORAL_TABLET | Freq: Two times a day (BID) | ORAL | Status: AC

## 2010-10-25 MED ORDER — CEFTRIAXONE SODIUM 1 G IJ SOLR
1.0000 g | Freq: Once | INTRAMUSCULAR | Status: AC
  Administered 2010-10-25: 1 g via INTRAMUSCULAR

## 2010-10-25 NOTE — Patient Instructions (Addendum)
Schedule annual Medicare Wellness Visit in November with fasting labs prior. Make her a follow up appt for recheck in Saturday clinic please.  Start antibiotic ASAP.

## 2010-10-25 NOTE — Telephone Encounter (Signed)
Spoke with dr Ermalene Searing and she said its okay for patient to take vicodin

## 2010-10-25 NOTE — Telephone Encounter (Signed)
Pt was seen earlier.  She says she's in a lot of pain and is asking if ok to take some vicodin that she has on hand.  Says pain is no worse than earlier, about the same.

## 2010-10-25 NOTE — Progress Notes (Signed)
  Subjective:    Patient ID: Melanie Lang, female    DOB: 07-Jan-1946, 65 y.o.   MRN: [id number]  HPI  65 year old female with history of allergies and well controlled diabetes presents with 2 weeks of post nasal drip, cough improved some. In last 24 hours she noted  distinct swelling and pain in right facial area Nose inside right nostril burning. Right teeth upper aching. Some congestion, no cough, no SOB No fever.  Using saline spray. Not using any other medication.  Lab Results  Component Value Date   HGBA1C 5.6 05/30/2010      Review of Systems  Constitutional: Negative for fever and chills.  HENT: Positive for congestion. Negative for ear pain, rhinorrhea, neck pain, postnasal drip and ear discharge.   Eyes: Positive for pain. Negative for redness and visual disturbance.  Respiratory: Negative for shortness of breath.   Cardiovascular: Negative for chest pain.       Objective:   Physical Exam  Constitutional: Vital signs are normal. She appears well-developed and well-nourished. She is cooperative.  Non-toxic appearance. She does not appear ill. No distress.  HENT:  Head: Normocephalic.  Right Ear: Hearing, tympanic membrane, external ear and ear canal normal. Tympanic membrane is not erythematous, not retracted and not bulging.  Left Ear: Hearing, tympanic membrane, external ear and ear canal normal. Tympanic membrane is not erythematous, not retracted and not bulging.  Nose: Mucosal edema and sinus tenderness present. Right sinus exhibits maxillary sinus tenderness. Right sinus exhibits no frontal sinus tenderness. Left sinus exhibits no maxillary sinus tenderness and no frontal sinus tenderness.  Mouth/Throat: Uvula is midline, oropharynx is clear and moist and mucous membranes are normal. Abnormal dentition. Dental caries present. No oropharyngeal exudate, posterior oropharyngeal edema or tonsillar abscesses.       Right nostril partially occluded anteriorly with  mass/swelling stemming from lateral nares Can see turbinate posteriorly in right nostril There is a sore /scab on mass in nares, very tender to palpation  Left turbinate swollen and pale.  Tender over gum line anterior teeth, swelling, redness nor fluctuance palpated.  Visible swelling externally in right lower face near lip and nares    Eyes: Conjunctivae, EOM and lids are normal. Pupils are equal, round, and reactive to light. No foreign bodies found.  Neck: Trachea normal and normal range of motion. Neck supple. Carotid bruit is not present. No mass and no thyromegaly present.  Cardiovascular: Normal rate, regular rhythm, S1 normal, S2 normal, normal heart sounds, intact distal pulses and normal pulses.  Exam reveals no gallop and no friction rub.   No murmur heard. Pulmonary/Chest: Effort normal and breath sounds normal. Not tachypneic. No respiratory distress. She has no decreased breath sounds. She has no wheezes. She has no rhonchi. She has no rales.  Genitourinary: Vagina normal and uterus normal.  Neurological: She is alert.  Skin: Skin is warm, dry and intact. No rash noted.  Psychiatric: Her speech is normal and behavior is normal. Judgment normal. Her mood appears not anxious. Cognition and memory are normal. She does not exhibit a depressed mood.          Assessment & Plan:

## 2010-10-25 NOTE — Assessment & Plan Note (Addendum)
Distinct swelling in right upper lip, over maxillary area next to nostril and inside nasal passage...concerning for abscess... Upper gums sore on anterior teeth... If this is dental or nasal in origin. Will begin treatment with IM ceftriaxone today...start on broad spectrum antibioitc given she is diabetic and given location of issue. I will have her follow up tommorow in Saturday clinic to make sure improving. Pt told if pain swelling worsening or fever tonight... Go to ER for IV antibiotics and eval.  Total visit time 30 minutes, > 50% spent counseling and cordinating patients care.Contacted on call MD to notify of appt tommorow.

## 2010-10-26 ENCOUNTER — Encounter: Payer: Self-pay | Admitting: Family Medicine

## 2010-10-26 ENCOUNTER — Ambulatory Visit: Payer: Medicare Other | Admitting: Family Medicine

## 2010-10-26 ENCOUNTER — Ambulatory Visit (INDEPENDENT_AMBULATORY_CARE_PROVIDER_SITE_OTHER): Payer: Medicare Other | Admitting: Family Medicine

## 2010-10-26 VITALS — BP 132/80 | HR 76 | Temp 98.5°F | Wt 205.0 lb

## 2010-10-26 DIAGNOSIS — L0201 Cutaneous abscess of face: Secondary | ICD-10-CM

## 2010-10-26 DIAGNOSIS — L0291 Cutaneous abscess, unspecified: Secondary | ICD-10-CM

## 2010-10-26 MED ORDER — CEFTRIAXONE SODIUM 1 G IJ SOLR
1.0000 g | Freq: Once | INTRAMUSCULAR | Status: AC
  Administered 2010-10-26: 1 g via INTRAMUSCULAR

## 2010-10-26 NOTE — Patient Instructions (Addendum)
Complete all of Augmetnin. It not at least 75% bettter by Wednesday then follow up with Dr. Ermalene Searing before next weekend.  Please get your flu shot this fall.

## 2010-10-26 NOTE — Progress Notes (Signed)
  Subjective:    Patient ID: Melanie Lang, female    DOB: October 31, 1945, 65 y.o.   MRN: [id number]  HPI Well controlled diabetes. Had a rocephin shot yesterday. She feels better and says her pain is much better. Say sthe sweling is a little better on the right but feels more swollen on the left.  Still tender.  Did take her first pill of her ABX. She hasnt take her second dose yest. No vision changes on the right side. No fever. Pain is 1/10.  Has been using  Her pain med left over from her knee surgery.     Review of Systems     Objective:   Physical Exam  She has some facial swelling on the right side at the base of her nose It is still very tender and indurated.  She says it feel less firm and hard.  She removed her partial. She is non tender over her palate. No redness or swelling.       Assessment & Plan:  FAcial Abscess - Clinically getting better. Will given 2nd rocephin injection today. Complete all of Augmetnin. It not at least 75% bettter by Wednesday then follow up with Dr. Ermalene Searing.

## 2010-11-25 ENCOUNTER — Other Ambulatory Visit (INDEPENDENT_AMBULATORY_CARE_PROVIDER_SITE_OTHER): Payer: Medicare Other

## 2010-11-25 DIAGNOSIS — E785 Hyperlipidemia, unspecified: Secondary | ICD-10-CM

## 2010-11-25 DIAGNOSIS — E119 Type 2 diabetes mellitus without complications: Secondary | ICD-10-CM

## 2010-11-25 LAB — COMPREHENSIVE METABOLIC PANEL
ALT: 15 U/L (ref 0–35)
Albumin: 3.9 g/dL (ref 3.5–5.2)
Alkaline Phosphatase: 57 U/L (ref 39–117)
CO2: 28 mEq/L (ref 19–32)
GFR: 76.79 mL/min (ref >60.00)
Glucose, Bld: 118 mg/dL — ABNORMAL HIGH (ref 70–99)
Potassium: 4.1 mEq/L (ref 3.5–5.1)
Sodium: 139 mEq/L (ref 135–145)
Total Bilirubin: 0.5 mg/dL (ref 0.3–1.2)
Total Protein: 7.3 g/dL (ref 6.0–8.3)

## 2010-11-25 LAB — HEMOGLOBIN A1C: Hgb A1c MFr Bld: 6.3 % (ref 4.6–6.5)

## 2010-11-25 LAB — LIPID PANEL
HDL: 62.8 mg/dL (ref >39.00)
VLDL: 21.2 mg/dL (ref 0.0–40.0)

## 2010-11-25 LAB — LDL CHOLESTEROL, DIRECT: Direct LDL: 114.4 mg/dL

## 2010-11-29 ENCOUNTER — Encounter: Payer: Self-pay | Admitting: Family Medicine

## 2010-11-29 ENCOUNTER — Ambulatory Visit (INDEPENDENT_AMBULATORY_CARE_PROVIDER_SITE_OTHER): Payer: Medicare Other | Admitting: Family Medicine

## 2010-11-29 DIAGNOSIS — Z0000 Encounter for general adult medical examination without abnormal findings: Secondary | ICD-10-CM

## 2010-11-29 DIAGNOSIS — G5600 Carpal tunnel syndrome, unspecified upper limb: Secondary | ICD-10-CM

## 2010-11-29 DIAGNOSIS — Z1231 Encounter for screening mammogram for malignant neoplasm of breast: Secondary | ICD-10-CM

## 2010-11-29 DIAGNOSIS — Z780 Asymptomatic menopausal state: Secondary | ICD-10-CM

## 2010-11-29 DIAGNOSIS — E119 Type 2 diabetes mellitus without complications: Secondary | ICD-10-CM

## 2010-11-29 DIAGNOSIS — I10 Essential (primary) hypertension: Secondary | ICD-10-CM

## 2010-11-29 DIAGNOSIS — E785 Hyperlipidemia, unspecified: Secondary | ICD-10-CM

## 2010-11-29 DIAGNOSIS — G5601 Carpal tunnel syndrome, right upper limb: Secondary | ICD-10-CM | POA: Insufficient documentation

## 2010-11-29 LAB — HM DIABETES FOOT EXAM

## 2010-11-29 NOTE — Patient Instructions (Addendum)
Work on getting back to more walking fr longer/faster as tolerated 3-5 times a week. Water aerobics is a good idea to return to.  You are due for shingles vaccine (3) , TDap (2) and pneumonia vaccines (1).. Call insurance to find out coverage and where to get these vaccines. Stop at front desk to speak with Shirlee Limerick about scheduling mammo and Bone density.

## 2010-11-29 NOTE — Assessment & Plan Note (Signed)
Wear brace day and night, call if interested in referral to treat.

## 2010-11-29 NOTE — Assessment & Plan Note (Signed)
Well controlled on no medication  

## 2010-11-29 NOTE — Progress Notes (Signed)
Subjective:    Patient ID: Melanie Lang, female    DOB: 02/15/1945, 65 y.o.   MRN: [id number]  HPI  I have personally reviewed the Medicare Annual Wellness questionnaire and have noted 1. The patient's medical and social history 2. Their use of alcohol, tobacco or illicit drugs 3. Their current medications and supplements 4. The patient's functional ability including ADL's, fall risks, home safety risks and hearing or visual             impairment. 5. Diet and physical activities 6. Evidence for depression or mood disorders  The patients weight, height, BMI and visual acuity have been recorded in the chart I have made referrals, counseling and provided education to the patient based review of the above and I have provided the pt with a written personalized care plan for preventive services.  Hypertension:  Well controlled on lisinopril and HCTZ Using medication without problems or lightheadedness:  Chest pain with exertion:None Edema:None Short of breath:None Average home WUJ:[id number] daily 118/79 Other issues:  Diabetes: Well controlled with diet. Lab Results  Component Value Date   HGBA1C 6.3 11/25/2010  Hypoglycemic episodes:? Hyperglycemic episodes:? Feet problems:none Blood Sugars averaging:not checking eye exam within last year:yes  Elevated Cholesterol:LDL improved some since last check.. Almost at goal <100 on no medication.  Diet compliance: Good.  Exercise: walking some daily, but limited since past foot surgery. Other complaints:  Facial abscess.Marland Kitchen Resolved completely.  Right carpal tunnel.. Sleeps in brace. Still pain and numbness in hand, radial side. Minimal weakness. Not interested in surgery at this point.   Review of Systems  Constitutional: Negative for fever, fatigue and unexpected weight change.  HENT: Negative for ear pain, congestion, sore throat, sneezing, trouble swallowing and sinus pressure.   Eyes: Negative for pain and itching.    Respiratory: Negative for cough, shortness of breath and wheezing.   Cardiovascular: Negative for chest pain, palpitations and leg swelling.  Gastrointestinal: Negative for nausea, abdominal pain, diarrhea, constipation and blood in stool.  Genitourinary: Negative for dysuria, hematuria, vaginal discharge, difficulty urinating and menstrual problem.  Musculoskeletal: Positive for arthralgias.  Skin: Negative for rash.  Neurological: Negative for syncope, weakness, light-headedness, numbness and headaches.  Psychiatric/Behavioral: Negative for confusion and dysphoric mood. The patient is not nervous/anxious.        Occ mood swings, but no persistent dysphoric symptoms.       Objective:   Physical Exam  Constitutional: Vital signs are normal. She appears well-developed and well-nourished. She is cooperative.  Non-toxic appearance. She does not appear ill. No distress.  HENT:  Head: Normocephalic.  Right Ear: Hearing, tympanic membrane, external ear and ear canal normal.  Left Ear: Hearing, tympanic membrane, external ear and ear canal normal.  Nose: Nose normal.  Eyes: Conjunctivae, EOM and lids are normal. Pupils are equal, round, and reactive to light. No foreign bodies found.  Neck: Trachea normal and normal range of motion. Neck supple. Carotid bruit is not present. No mass and no thyromegaly present.  Cardiovascular: Normal rate, regular rhythm, S1 normal, S2 normal, normal heart sounds and intact distal pulses.  Exam reveals no gallop.   No murmur heard. Pulmonary/Chest: Effort normal and breath sounds normal. No respiratory distress. She has no wheezes. She has no rhonchi. She has no rales.  Abdominal: Soft. Normal appearance and bowel sounds are normal. She exhibits no distension, no fluid wave, no abdominal bruit and no mass. There is no hepatosplenomegaly. There is no tenderness. There is no rebound, no  guarding and no CVA tenderness. No hernia.  Genitourinary: No breast swelling,  tenderness, discharge or bleeding. Pelvic exam was performed with patient prone.  Musculoskeletal:       Right wrist:  positive tinel and phalen, pain in medial wrist, decreased sensation in 1st three digits on right.  Nml grip strenght B.   Lymphadenopathy:    She has no cervical adenopathy.    She has no axillary adenopathy.  Neurological: She is alert. She has normal strength. No cranial nerve deficit or sensory deficit.  Skin: Skin is warm, dry and intact. No rash noted.  Psychiatric: Her speech is normal and behavior is normal. Judgment normal. Her mood appears not anxious. Cognition and memory are normal. She does not exhibit a depressed mood.    Diabetic foot exam: Normal inspection No skin breakdown No calluses  Normal DP pulses Normal sensation to light touch and monofilament Nails normal       Assessment & Plan:  Annual Medicare Wellness: The patient's preventative maintenance and recommended screening tests for an annual wellness exam were reviewed in full today. Brought up to date unless services declined.  Counselled on the importance of diet, exercise, and its role in overall health and mortality. The patient's FH and SH was reviewed, including their home life, tobacco status, and drug and alcohol status.   Vaccines: refused/allergic flu, due for Td, pna and shingles vaccines.. She will look into coverage. Colon:02/2008.Marland Kitchen Plan repeat in 5 years. Mammogram: Last nml in 03/2010.Marland Kitchen She will schedule on your own. ZOX:WRUEA completed despite order in 2009... Pap/DVE: not indicated total hysterectomy for non cancer indication.

## 2010-11-29 NOTE — Assessment & Plan Note (Signed)
Improving slowly.. Almost at goal LDL. Continue lifestyle changes.

## 2010-11-29 NOTE — Assessment & Plan Note (Signed)
Well controlled. Continue current medication.  

## 2011-04-07 ENCOUNTER — Ambulatory Visit
Admission: RE | Admit: 2011-04-07 | Discharge: 2011-04-07 | Disposition: A | Discharge status: Home / Self Care | Payer: Medicare Other | Source: Ambulatory Visit | Attending: Family Medicine | Admitting: Family Medicine

## 2011-04-07 DIAGNOSIS — Z780 Asymptomatic menopausal state: Secondary | ICD-10-CM

## 2011-04-07 DIAGNOSIS — Z1231 Encounter for screening mammogram for malignant neoplasm of breast: Secondary | ICD-10-CM

## 2011-04-07 LAB — HM DIABETES EYE EXAM

## 2011-04-08 ENCOUNTER — Encounter: Payer: Self-pay | Admitting: Family Medicine

## 2011-04-08 DIAGNOSIS — M8580 Other specified disorders of bone density and structure, unspecified site: Secondary | ICD-10-CM

## 2011-04-08 HISTORY — DX: Other specified disorders of bone density and structure, unspecified site: M85.80

## 2011-04-11 ENCOUNTER — Encounter: Payer: Self-pay | Admitting: *Deleted

## 2011-05-22 ENCOUNTER — Telehealth: Payer: Self-pay | Admitting: Family Medicine

## 2011-05-22 DIAGNOSIS — M109 Gout, unspecified: Secondary | ICD-10-CM

## 2011-05-22 DIAGNOSIS — M8580 Other specified disorders of bone density and structure, unspecified site: Secondary | ICD-10-CM

## 2011-05-22 DIAGNOSIS — E785 Hyperlipidemia, unspecified: Secondary | ICD-10-CM

## 2011-05-22 DIAGNOSIS — E119 Type 2 diabetes mellitus without complications: Secondary | ICD-10-CM

## 2011-05-22 NOTE — Telephone Encounter (Signed)
Message copied by Excell Seltzer on Thu May 22, 2011 10:14 PM ------      Message from: Alvina Chou      Created: Thu May 22, 2011  9:24 AM       6 month labs,

## 2011-05-26 ENCOUNTER — Other Ambulatory Visit (INDEPENDENT_AMBULATORY_CARE_PROVIDER_SITE_OTHER): Payer: Medicare Other

## 2011-05-26 DIAGNOSIS — M109 Gout, unspecified: Secondary | ICD-10-CM

## 2011-05-26 DIAGNOSIS — E785 Hyperlipidemia, unspecified: Secondary | ICD-10-CM

## 2011-05-26 DIAGNOSIS — E119 Type 2 diabetes mellitus without complications: Secondary | ICD-10-CM

## 2011-05-26 DIAGNOSIS — M8580 Other specified disorders of bone density and structure, unspecified site: Secondary | ICD-10-CM

## 2011-05-26 LAB — LIPID PANEL
HDL: 68.4 mg/dL (ref >39.00)
Triglycerides: 112 mg/dL (ref 0.0–149.0)
VLDL: 22.4 mg/dL (ref 0.0–40.0)

## 2011-05-26 LAB — COMPREHENSIVE METABOLIC PANEL
ALT: 15 U/L (ref 0–35)
AST: 23 U/L (ref 0–37)
Albumin: 4.1 g/dL (ref 3.5–5.2)
Alkaline Phosphatase: 50 U/L (ref 39–117)
Potassium: 3.6 mEq/L (ref 3.5–5.1)
Sodium: 140 mEq/L (ref 135–145)
Total Protein: 7.7 g/dL (ref 6.0–8.3)

## 2011-05-26 LAB — URIC ACID: Uric Acid, Serum: 8.3 mg/dL — ABNORMAL HIGH (ref 2.4–7.0)

## 2011-05-27 LAB — VITAMIN D 25 HYDROXY (VIT D DEFICIENCY, FRACTURES): Vit D, 25-Hydroxy: 17 ng/mL — ABNORMAL LOW (ref 30–89)

## 2011-05-29 ENCOUNTER — Ambulatory Visit: Payer: Medicare Other | Admitting: Family Medicine

## 2011-06-06 ENCOUNTER — Ambulatory Visit (INDEPENDENT_AMBULATORY_CARE_PROVIDER_SITE_OTHER): Payer: Medicare Other | Admitting: Family Medicine

## 2011-06-06 ENCOUNTER — Encounter: Payer: Self-pay | Admitting: Family Medicine

## 2011-06-06 VITALS — BP 120/74 | HR 82 | Temp 98.5°F | Ht 65.5 in | Wt 207.8 lb

## 2011-06-06 DIAGNOSIS — M109 Gout, unspecified: Secondary | ICD-10-CM

## 2011-06-06 DIAGNOSIS — E785 Hyperlipidemia, unspecified: Secondary | ICD-10-CM

## 2011-06-06 DIAGNOSIS — I10 Essential (primary) hypertension: Secondary | ICD-10-CM

## 2011-06-06 DIAGNOSIS — M8580 Other specified disorders of bone density and structure, unspecified site: Secondary | ICD-10-CM

## 2011-06-06 DIAGNOSIS — M899 Disorder of bone, unspecified: Secondary | ICD-10-CM

## 2011-06-06 DIAGNOSIS — E119 Type 2 diabetes mellitus without complications: Secondary | ICD-10-CM

## 2011-06-06 LAB — HM DIABETES FOOT EXAM

## 2011-06-06 MED ORDER — VITAMIN D3 1.25 MG (50000 UT) PO CAPS: 1.0000 | ORAL_CAPSULE | ORAL | Status: DC

## 2011-06-06 NOTE — Assessment & Plan Note (Signed)
Well controlled. Continue current medication.  

## 2011-06-06 NOTE — Assessment & Plan Note (Signed)
Vit D def: replete

## 2011-06-06 NOTE — Assessment & Plan Note (Signed)
Well controlled on no med. 

## 2011-06-06 NOTE — Patient Instructions (Addendum)
Get back on track with low fat diet, regular exercise and weight loss. Red yeast rice 2400 mg divided daily. Take vit D supplement weekly for 12 weeks then go to daily over the counter supplement. Appt for CPX in 6 months with fasting labs prior.

## 2011-06-06 NOTE — Progress Notes (Signed)
  Subjective:    Patient ID: Melanie Lang, female    DOB: [birth year], 66 y.o.   MRN: [id number]  HPI 66 year old female here for follow up.  Diabetes:  Well controlled on on no medication. Lab Results  Component Value Date   HGBA1C 6.3 05/26/2011  Using medications without difficulties: Hypoglycemic episodes:? Hyperglycemic episodes:? Feet problems:None Blood Sugars averaging:Not checking eye exam within last year:yes  Elevated Cholesterol: Inadequate control on no medication. Lab Results  Component Value Date   CHOL 217* 05/26/2011   HDL 68.40 05/26/2011   LDLDIRECT 130.1 05/26/2011   TRIG 112.0 05/26/2011   CHOLHDL 3 05/26/2011    Using medications without problems:Nont on Muscle aches:  None Diet compliance:Good Exercise: none given knee issue, started back 2 weeks ago. Other complaints:  Hypertension:  Well controlled on maxide and lisinopril  Using medication without problems or lightheadedness: None Chest pain with exertion: None Edema:None Short of breath:None Average home BPs: daily around 117/75 Other issues:  Gout: no recent gout flares despite uric acid in 8s.    Review of Systems  Constitutional: Negative for fever and fatigue.  HENT: Negative for ear pain.   Eyes: Negative for pain.  Respiratory: Negative for chest tightness and shortness of breath.   Cardiovascular: Negative for chest pain, palpitations and leg swelling.  Gastrointestinal: Negative for abdominal pain.  Genitourinary: Negative for dysuria.       Objective:   Physical Exam  Constitutional: Vital signs are normal. She appears well-developed and well-nourished. She is cooperative.  Non-toxic appearance. She does not appear ill. No distress.  HENT:  Head: Normocephalic.  Right Ear: Hearing, tympanic membrane, external ear and ear canal normal. Tympanic membrane is not erythematous, not retracted and not bulging.  Left Ear: Hearing, tympanic membrane, external ear and ear canal normal.  Tympanic membrane is not erythematous, not retracted and not bulging.  Nose: No mucosal edema or rhinorrhea. Right sinus exhibits no maxillary sinus tenderness and no frontal sinus tenderness. Left sinus exhibits no maxillary sinus tenderness and no frontal sinus tenderness.  Mouth/Throat: Uvula is midline, oropharynx is clear and moist and mucous membranes are normal.  Eyes: Conjunctivae, EOM and lids are normal. Pupils are equal, round, and reactive to light. No foreign bodies found.  Neck: Trachea normal and normal range of motion. Neck supple. Carotid bruit is not present. No mass and no thyromegaly present.  Cardiovascular: Normal rate, regular rhythm, S1 normal, S2 normal, normal heart sounds, intact distal pulses and normal pulses.  Exam reveals no gallop and no friction rub.   No murmur heard. Pulmonary/Chest: Effort normal and breath sounds normal. Not tachypneic. No respiratory distress. She has no decreased breath sounds. She has no wheezes. She has no rhonchi. She has no rales.  Abdominal: Soft. Normal appearance and bowel sounds are normal. There is no tenderness.  Neurological: She is alert.  Skin: Skin is warm, dry and intact. No rash noted.  Psychiatric: Her speech is normal and behavior is normal. Judgment and thought content normal. Her mood appears not anxious. Cognition and memory are normal. She does not exhibit a depressed mood.    Diabetic foot exam: sees podiatrist Bunion, crossover of toes, hammer toes B No skin breakdown Yes calluses  Normal DP pulses Normal sensation to light touch and monofilament Nails thickened, ingrown nail left great Varicose veins B.       Assessment & Plan:

## 2011-06-06 NOTE — Assessment & Plan Note (Signed)
No flares, does not wish to use medicaiton.

## 2011-06-06 NOTE — Assessment & Plan Note (Signed)
Inadequate  control.. Start red yeast rice, get back on track with lifestyle.

## 2011-06-09 ENCOUNTER — Other Ambulatory Visit: Payer: Self-pay | Admitting: *Deleted

## 2011-06-09 MED ORDER — TRIAMTERENE-HCTZ 75-50 MG PO TABS: ORAL_TABLET | ORAL | Status: DC

## 2011-07-15 ENCOUNTER — Other Ambulatory Visit: Payer: Self-pay | Admitting: Family Medicine

## 2011-10-31 IMAGING — CR DG KNEE 1-2V*L*
2 series · 2 of 2 positions shown · non-contrast
Comparison: 10/15/2006

CLINICAL DATA: Knee pain

LEFT KNEE - 1-2 VIEW

[view not recorded (1 of 2)]
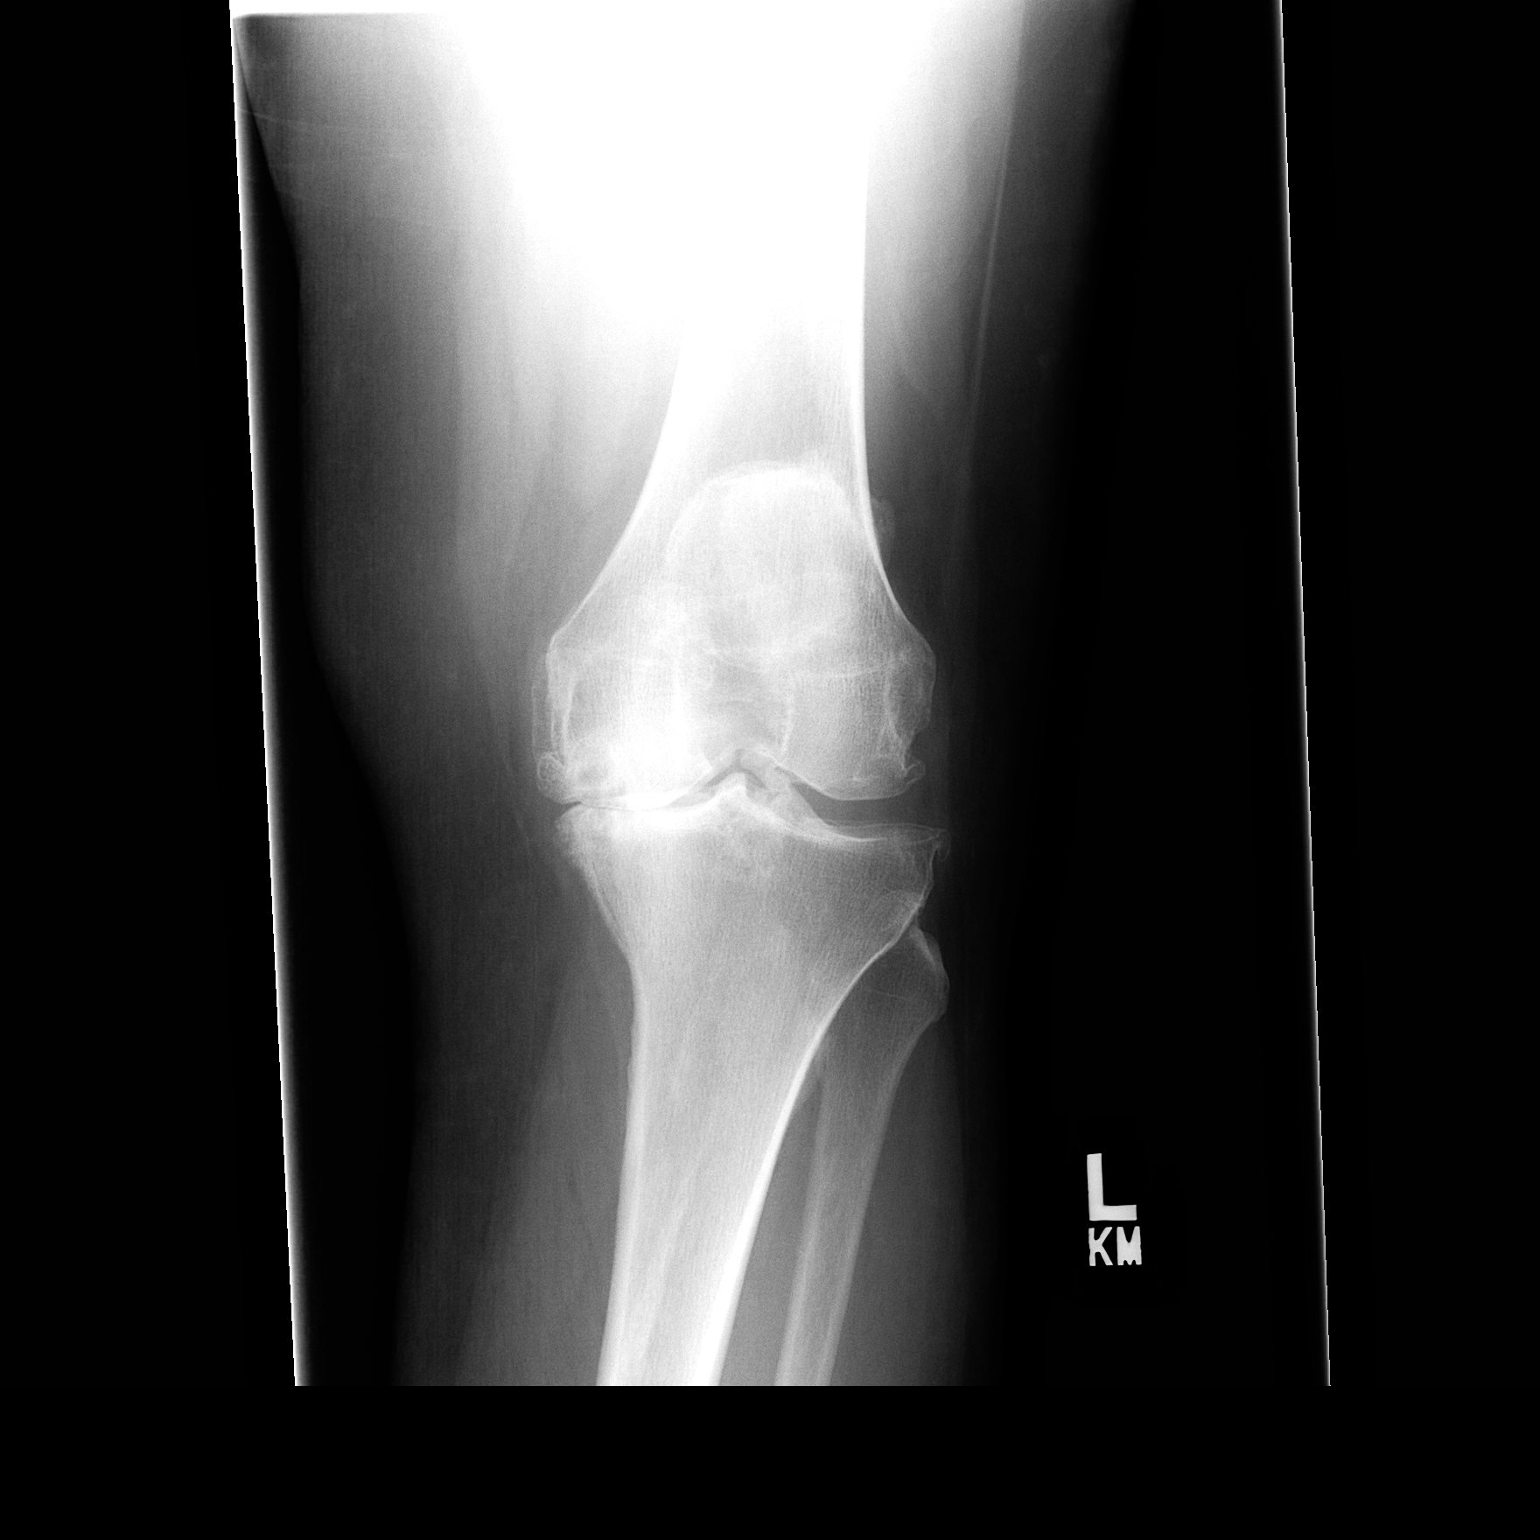

[view not recorded (2 of 2)]
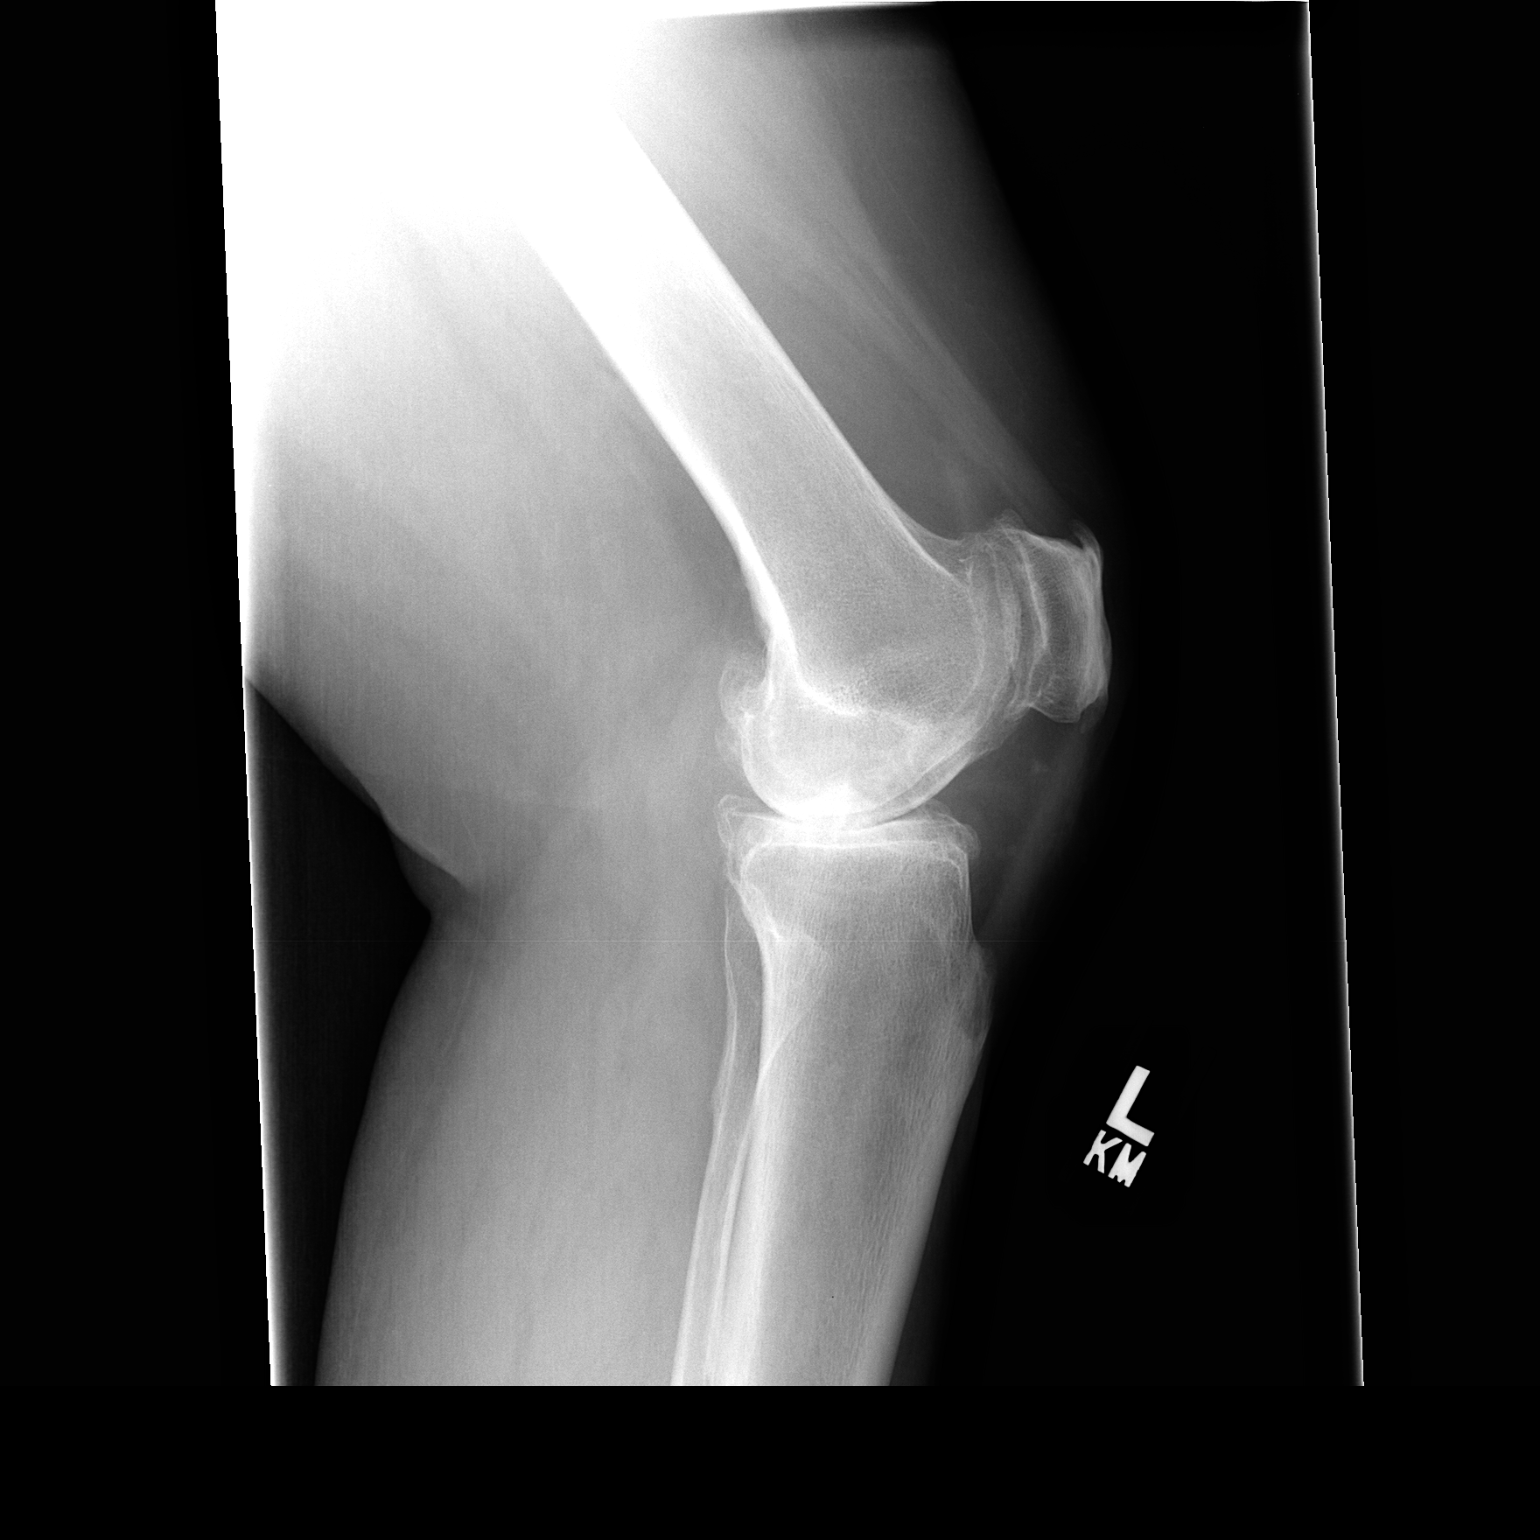

[2 of 2 positions shown; findings below may reference images not displayed]

FINDINGS: Complete loss of joint space is evident within the medial
compartment.  There is moderate hypertrophic spurring in all three
compartments.  There may be an intra-articular loose body in the
joint space, best seen on the frontal projection in the
intercondylar notch.  No evidence for joint effusion.
IMPRESSION: Advanced osteoarthritis of the left knee, not substantially
progressed since the prior study.

## 2011-11-22 LAB — HM DIABETES EYE EXAM

## 2011-11-28 ENCOUNTER — Telehealth: Payer: Self-pay | Admitting: Family Medicine

## 2011-11-28 DIAGNOSIS — M8580 Other specified disorders of bone density and structure, unspecified site: Secondary | ICD-10-CM

## 2011-11-28 DIAGNOSIS — I10 Essential (primary) hypertension: Secondary | ICD-10-CM

## 2011-11-28 DIAGNOSIS — M109 Gout, unspecified: Secondary | ICD-10-CM

## 2011-11-28 DIAGNOSIS — E119 Type 2 diabetes mellitus without complications: Secondary | ICD-10-CM

## 2011-11-28 DIAGNOSIS — E785 Hyperlipidemia, unspecified: Secondary | ICD-10-CM

## 2011-11-28 NOTE — Telephone Encounter (Signed)
Message copied by Excell Seltzer on Fri Nov 28, 2011  2:53 PM ------      Message from: Baldomero Lamy      Created: Fri Nov 21, 2011 10:51 AM      Regarding: Cpx labs Tues 11/12       Please order  future cpx labs for pt's upcomming lab appt.      Thanks      Rodney Booze

## 2011-12-02 ENCOUNTER — Other Ambulatory Visit (INDEPENDENT_AMBULATORY_CARE_PROVIDER_SITE_OTHER): Payer: Medicare Other

## 2011-12-02 DIAGNOSIS — E785 Hyperlipidemia, unspecified: Secondary | ICD-10-CM

## 2011-12-02 DIAGNOSIS — M8580 Other specified disorders of bone density and structure, unspecified site: Secondary | ICD-10-CM

## 2011-12-02 DIAGNOSIS — E119 Type 2 diabetes mellitus without complications: Secondary | ICD-10-CM

## 2011-12-02 LAB — COMPREHENSIVE METABOLIC PANEL
Albumin: 4 g/dL (ref 3.5–5.2)
CO2: 27 mEq/L (ref 19–32)
Calcium: 9.6 mg/dL (ref 8.4–10.5)
Chloride: 100 mEq/L (ref 96–112)
GFR: 76.55 mL/min (ref >60.00)
Glucose, Bld: 109 mg/dL — ABNORMAL HIGH (ref 70–99)
Potassium: 4.3 mEq/L (ref 3.5–5.1)
Sodium: 137 mEq/L (ref 135–145)
Total Bilirubin: 0.8 mg/dL (ref 0.3–1.2)
Total Protein: 7.5 g/dL (ref 6.0–8.3)

## 2011-12-02 LAB — LDL CHOLESTEROL, DIRECT: Direct LDL: 125.7 mg/dL

## 2011-12-02 LAB — LIPID PANEL: Cholesterol: 227 mg/dL — ABNORMAL HIGH (ref 0–200)

## 2011-12-02 LAB — HEMOGLOBIN A1C: Hgb A1c MFr Bld: 6.2 % (ref 4.6–6.5)

## 2011-12-08 ENCOUNTER — Encounter: Payer: Self-pay | Admitting: Family Medicine

## 2011-12-08 ENCOUNTER — Ambulatory Visit (INDEPENDENT_AMBULATORY_CARE_PROVIDER_SITE_OTHER): Payer: Medicare Other | Admitting: Family Medicine

## 2011-12-08 VITALS — BP 140/72 | HR 92 | Temp 98.3°F | Ht 65.5 in | Wt 207.5 lb

## 2011-12-08 DIAGNOSIS — E119 Type 2 diabetes mellitus without complications: Secondary | ICD-10-CM

## 2011-12-08 DIAGNOSIS — I10 Essential (primary) hypertension: Secondary | ICD-10-CM

## 2011-12-08 DIAGNOSIS — Z23 Encounter for immunization: Secondary | ICD-10-CM

## 2011-12-08 DIAGNOSIS — Z0000 Encounter for general adult medical examination without abnormal findings: Secondary | ICD-10-CM

## 2011-12-08 DIAGNOSIS — E785 Hyperlipidemia, unspecified: Secondary | ICD-10-CM

## 2011-12-08 NOTE — Patient Instructions (Addendum)
Get back on track with exercise. Red yeast rice 1200 mg twice daily. Call [employer] to see if shingles vaccine covered.  Call to schedule mammogram  Follow up in 6 months with fasting labs prior.

## 2011-12-08 NOTE — Assessment & Plan Note (Signed)
Diet controlled.  

## 2011-12-08 NOTE — Assessment & Plan Note (Signed)
Well controlled at home.

## 2011-12-08 NOTE — Progress Notes (Signed)
Subjective:    Patient ID: Melanie Lang, female    DOB: Dec 09, 1945, 66 y.o.   MRN: [id number]  HPI  I have personally reviewed the Medicare Annual Wellness questionnaire and have noted  1. The patient's medical and social history  2. Their use of alcohol, tobacco or illicit drugs  3. Their current medications and supplements  4. The patient's functional ability including ADL's, fall risks, home safety risks and hearing or visual  impairment.  5. Diet and physical activities  6. Evidence for depression or mood disorders   The patients weight, height, BMI and visual acuity have been recorded in the chart  I have made referrals, counseling and provided education to the patient based review of the above and I have provided the pt with a written personalized care plan for preventive services.   Hypertension: Borderline control today, but well controlled on lisinopril and HCTZ  Using medication without problems or lightheadedness: None Chest pain with exertion:None  Edema:occ, better with support hose Short of breath:None  Average home WUJ:[id number] daily 120/79 Other issues:   Diabetes: Well controlled with diet.  Lab Results  Component Value Date   HGBA1C 6.2 12/02/2011  Hypoglycemic episodes:?  Hyperglycemic episodes:?  Feet problems:none  Blood Sugars averaging:not checking  eye exam within last year:yes   Elevated Cholesterol:LDL worsened some since last check.. Not at goal <100 on no medication.  Lab Results  Component Value Date   CHOL 227* 12/02/2011   HDL 63.80 12/02/2011   LDLDIRECT 125.7 12/02/2011   TRIG 112.0 12/02/2011   CHOLHDL 4 12/02/2011    Diet compliance: Good.  Exercise: None recently Other complaints:    Review of Systems  Constitutional: Negative for fever and fatigue.  HENT: Negative for ear pain.   Eyes: Negative for pain.  Respiratory: Negative for chest tightness and shortness of breath.   Cardiovascular: Negative for chest pain,  palpitations and leg swelling.  Gastrointestinal: Negative for abdominal pain.  Genitourinary: Negative for dysuria.       Objective:   Physical Exam  Constitutional: Vital signs are normal. She appears well-developed and well-nourished. She is cooperative.  Non-toxic appearance. She does not appear ill. No distress.  HENT:  Head: Normocephalic.  Right Ear: Hearing, tympanic membrane, external ear and ear canal normal.  Left Ear: Hearing, tympanic membrane, external ear and ear canal normal.  Nose: Nose normal.  Eyes: Conjunctivae normal, EOM and lids are normal. Pupils are equal, round, and reactive to light. No foreign bodies found.  Neck: Trachea normal and normal range of motion. Neck supple. Carotid bruit is not present. No mass and no thyromegaly present.  Cardiovascular: Normal rate, regular rhythm, S1 normal, S2 normal, normal heart sounds and intact distal pulses.  Exam reveals no gallop.   No murmur heard. Pulmonary/Chest: Effort normal and breath sounds normal. No respiratory distress. She has no wheezes. She has no rhonchi. She has no rales.  Abdominal: Soft. Normal appearance and bowel sounds are normal. She exhibits no distension, no fluid wave, no abdominal bruit and no mass. There is no hepatosplenomegaly. There is no tenderness. There is no rebound, no guarding and no CVA tenderness. No hernia.  Genitourinary: No breast swelling, tenderness, discharge or bleeding.  Lymphadenopathy:    She has no cervical adenopathy.    She has no axillary adenopathy.  Neurological: She is alert. She has normal strength. No cranial nerve deficit or sensory deficit.  Skin: Skin is warm, dry and intact. No rash noted.  Psychiatric:  Her speech is normal and behavior is normal. Judgment normal. Her mood appears not anxious. Cognition and memory are normal. She does not exhibit a depressed mood.     Diabetic foot exam: Normal inspection No skin breakdown No calluses  Normal DP  pulses Normal sensation to light touch and monofilament Nails normal      Assessment & Plan:  The patient's preventative maintenance and recommended screening tests for an annual wellness exam were reviewed in full today. Brought up to date unless services declined.  Counselled on the importance of diet, exercise, and its role in overall health and mortality. The patient's FH and SH was reviewed, including their home life, tobacco status, and drug and alcohol status.   Vaccines: refused/allergic flu, uptodate flu, due  For PNA and shingles vaccines.. She will look into coverage.  Colon:02/2008.Marland Kitchen Plan repeat in 5 years.  Mammogram: Last nml in 11/2010.Marland Kitchen She will schedule on your own.  ZOX:[id number] 11/2010 Pap/DVE: not indicated total hysterectomy for non cancer indication.

## 2011-12-08 NOTE — Assessment & Plan Note (Signed)
Inadequate control. Work on lifestyle change and add red yeast rice. Recheck in 6 months.

## 2012-04-14 ENCOUNTER — Other Ambulatory Visit: Payer: Self-pay

## 2012-04-14 DIAGNOSIS — Z1231 Encounter for screening mammogram for malignant neoplasm of breast: Secondary | ICD-10-CM

## 2012-05-18 ENCOUNTER — Ambulatory Visit
Admission: RE | Admit: 2012-05-18 | Discharge: 2012-05-18 | Disposition: A | Discharge status: Home / Self Care | Payer: Medicare Other | Source: Ambulatory Visit

## 2012-05-18 DIAGNOSIS — Z1231 Encounter for screening mammogram for malignant neoplasm of breast: Secondary | ICD-10-CM

## 2012-05-27 ENCOUNTER — Telehealth: Payer: Self-pay | Admitting: Family Medicine

## 2012-05-27 DIAGNOSIS — E785 Hyperlipidemia, unspecified: Secondary | ICD-10-CM

## 2012-05-27 DIAGNOSIS — E119 Type 2 diabetes mellitus without complications: Secondary | ICD-10-CM

## 2012-05-27 DIAGNOSIS — M8580 Other specified disorders of bone density and structure, unspecified site: Secondary | ICD-10-CM

## 2012-05-27 DIAGNOSIS — I10 Essential (primary) hypertension: Secondary | ICD-10-CM

## 2012-05-27 NOTE — Telephone Encounter (Signed)
Message copied by Excell Seltzer on Thu May 27, 2012  3:38 PM ------      Message from: Alvina Chou      Created: Thu May 20, 2012 12:36 PM      Regarding: lab orders for Tuesday, 5.13.14       Lab orders for 6 month f/u       ------

## 2012-06-01 ENCOUNTER — Other Ambulatory Visit (INDEPENDENT_AMBULATORY_CARE_PROVIDER_SITE_OTHER): Payer: Medicare Other

## 2012-06-01 DIAGNOSIS — E119 Type 2 diabetes mellitus without complications: Secondary | ICD-10-CM

## 2012-06-01 DIAGNOSIS — I10 Essential (primary) hypertension: Secondary | ICD-10-CM

## 2012-06-01 DIAGNOSIS — E785 Hyperlipidemia, unspecified: Secondary | ICD-10-CM

## 2012-06-01 LAB — COMPREHENSIVE METABOLIC PANEL
AST: 21 U/L (ref 0–37)
Albumin: 3.8 g/dL (ref 3.5–5.2)
BUN: 19 mg/dL (ref 6–23)
CO2: 29 mEq/L (ref 19–32)
Calcium: 9.8 mg/dL (ref 8.4–10.5)
Chloride: 102 mEq/L (ref 96–112)
GFR: 80.37 mL/min (ref >60.00)
Glucose, Bld: 108 mg/dL — ABNORMAL HIGH (ref 70–99)
Potassium: 4 mEq/L (ref 3.5–5.1)

## 2012-06-01 LAB — HEMOGLOBIN A1C: Hgb A1c MFr Bld: 6.4 % (ref 4.6–6.5)

## 2012-06-01 LAB — LIPID PANEL: Triglycerides: 87 mg/dL (ref 0.0–149.0)

## 2012-06-08 ENCOUNTER — Ambulatory Visit: Payer: Medicare Other | Admitting: Family Medicine

## 2012-06-10 ENCOUNTER — Ambulatory Visit (INDEPENDENT_AMBULATORY_CARE_PROVIDER_SITE_OTHER): Payer: Medicare Other | Admitting: Family Medicine

## 2012-06-10 ENCOUNTER — Encounter: Payer: Self-pay | Admitting: Family Medicine

## 2012-06-10 VITALS — BP 130/70 | HR 80 | Temp 98.2°F | Ht 65.5 in | Wt 207.0 lb

## 2012-06-10 DIAGNOSIS — I10 Essential (primary) hypertension: Secondary | ICD-10-CM

## 2012-06-10 DIAGNOSIS — E785 Hyperlipidemia, unspecified: Secondary | ICD-10-CM

## 2012-06-10 DIAGNOSIS — E119 Type 2 diabetes mellitus without complications: Secondary | ICD-10-CM

## 2012-06-10 DIAGNOSIS — M109 Gout, unspecified: Secondary | ICD-10-CM

## 2012-06-10 MED ORDER — COLCHICINE 0.6 MG PO TABS: 0.6000 mg | ORAL_TABLET | Freq: Two times a day (BID) | ORAL | Status: DC

## 2012-06-10 NOTE — Patient Instructions (Addendum)
We will call you with uric acid results to determine if everyday allopurinol is needed. Start colchicine 1 tab twice daily until flare resolved. Try to take red yeast rice EVERY DAY!

## 2012-06-10 NOTE — Assessment & Plan Note (Signed)
Diet controlled. Encouraged exercise, weight loss, healthy eating habits.  

## 2012-06-10 NOTE — Assessment & Plan Note (Signed)
Almost at goal with red yeast rice 3 days a week. Pt will try to take more regularly.

## 2012-06-10 NOTE — Assessment & Plan Note (Signed)
Well controlled. Continue current medication.  

## 2012-06-10 NOTE — Assessment & Plan Note (Signed)
Will eval uric acid.. If still elevated despite low uric acid diet may need allopurinol.  Gout flare per pt not fiully resolved.. great toe still achy. Will treat with colchicine until flare resolved.

## 2012-06-10 NOTE — Progress Notes (Signed)
Subjective:    Patient ID: Melanie Lang, female    DOB: Jun 29, 1945, 67 y.o.   MRN: [id number]  HPI 67 year old female presents for 6 month follow up.   Had one episode of gout 2 months ago in left great toe. Drank water and took decadron per Dr. Frederich Chick. Triggered by beans. Gradually resolved.  She has not had a gout attack at age 1 years old. Had restarted exercisng right prior.  She has been making changes to diet.  Hypertension: Well controlled today on lisinopril and HCTZ  Using medication without problems or lightheadedness: None  Chest pain with exertion:None  Edema:occ, better with support hose  Short of breath:None  Average home QMV:HQIONGEX daily 120/79  Other issues:   Diabetes: Well controlled with diet.   Lab Results  Component Value Date   HGBA1C 6.4 06/01/2012   Hypoglycemic episodes:?  Hyperglycemic episodes:?  Feet problems: bunioins and gout Blood Sugars averaging:not checking  eye exam within last year:scheduled in 06/2012.  Elevated Cholesterol:LDL improved some since last check.. Not at goal <100 on no medication.  Has not been taking red yeast rice regularly. Taking 3 times a week. Lab Results  Component Value Date   CHOL 209* 06/01/2012   HDL 65.90 06/01/2012   LDLDIRECT 115.2 06/01/2012   TRIG 87.0 06/01/2012   CHOLHDL 3 06/01/2012   Diet compliance: Good.  Exercise: Has been trying to start regimen Other complaints:       Review of Systems  Constitutional: Negative for fever and fatigue.  HENT: Negative for ear pain.   Eyes: Negative for pain.  Respiratory: Negative for chest tightness and shortness of breath.   Cardiovascular: Negative for chest pain, palpitations and leg swelling.  Gastrointestinal: Negative for abdominal pain.  Genitourinary: Negative for dysuria.       Objective:   Physical Exam  Constitutional: Vital signs are normal. She appears well-developed and well-nourished. She is cooperative.  Non-toxic  appearance. She does not appear ill. No distress.  HENT:  Head: Normocephalic.  Right Ear: Hearing, tympanic membrane, external ear and ear canal normal. Tympanic membrane is not erythematous, not retracted and not bulging.  Left Ear: Hearing, tympanic membrane, external ear and ear canal normal. Tympanic membrane is not erythematous, not retracted and not bulging.  Nose: No mucosal edema or rhinorrhea. Right sinus exhibits no maxillary sinus tenderness and no frontal sinus tenderness. Left sinus exhibits no maxillary sinus tenderness and no frontal sinus tenderness.  Mouth/Throat: Uvula is midline, oropharynx is clear and moist and mucous membranes are normal.  Eyes: Conjunctivae, EOM and lids are normal. Pupils are equal, round, and reactive to light. No foreign bodies found.  Neck: Trachea normal and normal range of motion. Neck supple. Carotid bruit is not present. No mass and no thyromegaly present.  Cardiovascular: Normal rate, regular rhythm, S1 normal, S2 normal, normal heart sounds, intact distal pulses and normal pulses.  Exam reveals no gallop and no friction rub.   No murmur heard. Pulmonary/Chest: Effort normal and breath sounds normal. Not tachypneic. No respiratory distress. She has no decreased breath sounds. She has no wheezes. She has no rhonchi. She has no rales.  Abdominal: Soft. Normal appearance and bowel sounds are normal. There is no tenderness.  Neurological: She is alert.  Skin: Skin is warm, dry and intact. No rash noted.  Psychiatric: Her speech is normal and behavior is normal. Judgment and thought content normal. Her mood appears not anxious. Cognition and memory are normal. She  does not exhibit a depressed mood.     Diabetic foot exam: Bunions hammer toes. No skin breakdown No calluses  Normal DP pulses Normal sensation to light touch and monofilament Nails normal      Assessment & Plan:

## 2012-07-30 ENCOUNTER — Telehealth: Payer: Self-pay | Admitting: Family Medicine

## 2012-07-30 NOTE — Telephone Encounter (Signed)
Pt dropped off [facility] form to be completed.  Best number for patient [phone]

## 2012-07-30 NOTE — Telephone Encounter (Signed)
In your inbox.

## 2012-08-03 DIAGNOSIS — Z0279 Encounter for issue of other medical certificate: Secondary | ICD-10-CM

## 2012-08-03 NOTE — Telephone Encounter (Signed)
In out box. Please attach problem and med list.

## 2012-08-04 NOTE — Telephone Encounter (Signed)
Form given to  Office for patient to be advised form ready for pick up

## 2012-08-18 ENCOUNTER — Other Ambulatory Visit: Payer: Self-pay | Admitting: Family Medicine

## 2012-09-14 ENCOUNTER — Other Ambulatory Visit: Payer: Self-pay

## 2012-09-14 MED ORDER — LISINOPRIL 20 MG PO TABS: ORAL_TABLET | ORAL | Status: DC

## 2012-09-14 NOTE — Telephone Encounter (Signed)
Pt is out of town and request 90day refill lisinopril to [facility] [place]. Advised pt done.

## 2012-12-07 ENCOUNTER — Other Ambulatory Visit (INDEPENDENT_AMBULATORY_CARE_PROVIDER_SITE_OTHER): Payer: Medicare Other

## 2012-12-07 ENCOUNTER — Telehealth (INDEPENDENT_AMBULATORY_CARE_PROVIDER_SITE_OTHER): Payer: Medicare Other | Admitting: Family Medicine

## 2012-12-07 DIAGNOSIS — M8580 Other specified disorders of bone density and structure, unspecified site: Secondary | ICD-10-CM

## 2012-12-07 DIAGNOSIS — M109 Gout, unspecified: Secondary | ICD-10-CM

## 2012-12-07 DIAGNOSIS — E119 Type 2 diabetes mellitus without complications: Secondary | ICD-10-CM

## 2012-12-07 DIAGNOSIS — M899 Disorder of bone, unspecified: Secondary | ICD-10-CM

## 2012-12-07 DIAGNOSIS — E785 Hyperlipidemia, unspecified: Secondary | ICD-10-CM

## 2012-12-07 DIAGNOSIS — I10 Essential (primary) hypertension: Secondary | ICD-10-CM

## 2012-12-07 LAB — URIC ACID: Uric Acid, Serum: 8.1 mg/dL — ABNORMAL HIGH (ref 2.4–7.0)

## 2012-12-07 LAB — COMPREHENSIVE METABOLIC PANEL
ALT: 18 U/L (ref 0–35)
Albumin: 3.9 g/dL (ref 3.5–5.2)
CO2: 30 mEq/L (ref 19–32)
Calcium: 9.8 mg/dL (ref 8.4–10.5)
Chloride: 102 mEq/L (ref 96–112)
Creatinine, Ser: 0.9 mg/dL (ref 0.4–1.2)
GFR: 77.26 mL/min (ref >60.00)
Potassium: 3.9 mEq/L (ref 3.5–5.1)

## 2012-12-07 LAB — LIPID PANEL: HDL: 64.4 mg/dL (ref >39.00)

## 2012-12-07 LAB — LDL CHOLESTEROL, DIRECT: Direct LDL: 132.7 mg/dL

## 2012-12-07 NOTE — Telephone Encounter (Signed)
Message copied by Excell Seltzer on Tue Dec 07, 2012 11:18 AM ------      Message from: Alvina Chou      Created: Tue Dec 07, 2012  9:20 AM      Regarding: lab ordewrs for now        Patient is scheduled for CPX labs, please order future labs, Thanks , Camelia Eng                  Thank you! ------

## 2012-12-08 LAB — VITAMIN D 25 HYDROXY (VIT D DEFICIENCY, FRACTURES): Vit D, 25-Hydroxy: 25 ng/mL — ABNORMAL LOW (ref 30–89)

## 2012-12-21 ENCOUNTER — Ambulatory Visit (INDEPENDENT_AMBULATORY_CARE_PROVIDER_SITE_OTHER): Payer: Medicare Other | Admitting: Family Medicine

## 2012-12-21 ENCOUNTER — Encounter: Payer: Self-pay | Admitting: Family Medicine

## 2012-12-21 VITALS — BP 130/76 | HR 85 | Temp 98.4°F | Ht 64.0 in | Wt 211.5 lb

## 2012-12-21 DIAGNOSIS — E559 Vitamin D deficiency, unspecified: Secondary | ICD-10-CM

## 2012-12-21 DIAGNOSIS — E785 Hyperlipidemia, unspecified: Secondary | ICD-10-CM

## 2012-12-21 DIAGNOSIS — E119 Type 2 diabetes mellitus without complications: Secondary | ICD-10-CM

## 2012-12-21 DIAGNOSIS — I10 Essential (primary) hypertension: Secondary | ICD-10-CM

## 2012-12-21 DIAGNOSIS — Z0000 Encounter for general adult medical examination without abnormal findings: Secondary | ICD-10-CM

## 2012-12-21 LAB — HM DIABETES FOOT EXAM

## 2012-12-21 MED ORDER — ERGOCALCIFEROL 1.25 MG (50000 UT) PO CAPS: 50000.0000 [IU] | ORAL_CAPSULE | ORAL | Status: DC

## 2012-12-21 NOTE — Assessment & Plan Note (Signed)
Well controlled with diet. 

## 2012-12-21 NOTE — Progress Notes (Signed)
Pre-visit discussion using our clinic review tool. No additional management support is needed unless otherwise documented below in the visit note.  

## 2012-12-21 NOTE — Assessment & Plan Note (Signed)
Replete

## 2012-12-21 NOTE — Progress Notes (Signed)
HPI  I have personally reviewed the Medicare Annual Wellness questionnaire and have noted  1. The patient's medical and social history  2. Their use of alcohol, tobacco or illicit drugs  3. Their current medications and supplements  4. The patient's functional ability including ADL's, fall risks, home safety risks and hearing or visual  impairment.  5. Diet and physical activities  6. Evidence for depression or mood disorders  The patients weight, height, BMI and visual acuity have been recorded in the chart  I have made referrals, counseling and provided education to the patient based review of the above and I have provided the pt with a written personalized care plan for preventive services.   Hypertension: well controlled on lisinopril and HCTZ  BP Readings from Last 3 Encounters:  12/21/12 130/76  06/10/12 130/70  12/08/11 140/72  Using medication without problems or lightheadedness: None  Chest pain with exertion:None  Edema:occ, better with support hose  Short of breath:None  Average home UJW:[id number] daily 120-129/70-76 Other issues:   Diabetes: Well controlled with diet.  Lab Results  Component Value Date   HGBA1C 6.4 12/07/2012  Hypoglycemic episodes:?  Hyperglycemic episodes:?  Feet problems:none  Blood Sugars averaging:not checking  eye exam within last year: no, due  Elevated Cholesterol: LDL worsened some since last check.. Not at goal <100 on no medication.  Lab Results  Component Value Date   CHOL 225* 12/07/2012   HDL 64.40 12/07/2012   LDLDIRECT 132.7 12/07/2012   TRIG 105.0 12/07/2012   CHOLHDL 3 12/07/2012  Diet compliance: Good.  Exercise:  walking some. Other complaints:   Review of Systems  Constitutional: Negative for fever and fatigue.  HENT: Negative for ear pain.  Eyes: Negative for pain.  Respiratory: Negative for chest tightness and shortness of breath.  Cardiovascular: Negative for chest pain, palpitations and leg swelling.   Gastrointestinal: Negative for abdominal pain.  Genitourinary: Negative for dysuria.  Objective:   Physical Exam  Constitutional: Vital signs are normal. She appears well-developed and well-nourished. She is cooperative. Non-toxic appearance. She does not appear ill. No distress.  HENT:  Head: Normocephalic.  Right Ear: Hearing, tympanic membrane, external ear and ear canal normal.  Left Ear: Hearing, tympanic membrane, external ear and ear canal normal.  Nose: Nose normal.  Eyes: Conjunctivae normal, EOM and lids are normal. Pupils are equal, round, and reactive to light. No foreign bodies found.  Neck: Trachea normal and normal range of motion. Neck supple. Carotid bruit is not present. No mass and no thyromegaly present.  Cardiovascular: Normal rate, regular rhythm, S1 normal, S2 normal, normal heart sounds and intact distal pulses. Exam reveals no gallop.  No murmur heard.  Pulmonary/Chest: Effort normal and breath sounds normal. No respiratory distress. She has no wheezes. She has no rhonchi. She has no rales.  Abdominal: Soft. Normal appearance and bowel sounds are normal. She exhibits no distension, no fluid wave, no abdominal bruit and no mass. There is no hepatosplenomegaly. There is no tenderness. There is no rebound, no guarding and no CVA tenderness. No hernia.  Genitourinary: No breast swelling, tenderness, discharge or bleeding.  Lymphadenopathy:  She has no cervical adenopathy.  She has no axillary adenopathy.  Neurological: She is alert. She has normal strength. No cranial nerve deficit or sensory deficit.  Skin: Skin is warm, dry and intact. No rash noted.  Psychiatric: Her speech is normal and behavior is normal. Judgment normal. Her mood appears not anxious. Cognition and memory are normal. She does  not exhibit a depressed mood.   Diabetic foot exam:  Normal inspection  No skin breakdown  No calluses  Normal DP pulses  Normal sensation to light touch and monofilament   Nails normal  Assessment & Plan:   The patient's preventative maintenance and recommended screening tests for an annual wellness exam were reviewed in full today.  Brought up to date unless services declined.  Counselled on the importance of diet, exercise, and its role in overall health and mortality.  The patient's FH and SH was reviewed, including their home life, tobacco status, and drug and alcohol status.   Vaccines: refused/allergic flu, uptodate with PNA and shingles vaccines.. She will look into coverage.  Colon: 02/2008.Marland Kitchen Plan repeat in 5 years.  Mammogram: Last nml in 03/2012.Marland Kitchen She will schedule on your own.  DXA: osteopenia 11/2010, on vit D, but Vit D low. Will supplement. Pap/DVE: not indicated total hysterectomy for non cancer indication.

## 2012-12-21 NOTE — Patient Instructions (Addendum)
Make sure to set up yearly eye exam. Restart red yeast rice 1200 mg twice daily. Get back track with healthy eating and regular exercise. Check with insurance about shingles vaccine coverage.  Call to schedule mammogram on your own.   Start prescription vit D once weekly for 12 weeks then follow with daily vit D3 400 IU daily.

## 2012-12-21 NOTE — Assessment & Plan Note (Signed)
Plan to retry red yeast rice 2400 mg daily. Encouraged exercise, weight loss, healthy eating habits.

## 2012-12-21 NOTE — Assessment & Plan Note (Signed)
Well controlled. Continue current medication.  

## 2013-02-11 ENCOUNTER — Other Ambulatory Visit: Payer: Self-pay | Admitting: Family Medicine

## 2013-02-11 MED ORDER — LISINOPRIL 20 MG PO TABS: ORAL_TABLET | ORAL | Status: DC

## 2013-05-02 ENCOUNTER — Other Ambulatory Visit: Payer: Self-pay

## 2013-05-02 DIAGNOSIS — Z1231 Encounter for screening mammogram for malignant neoplasm of breast: Secondary | ICD-10-CM

## 2013-05-19 ENCOUNTER — Encounter (INDEPENDENT_AMBULATORY_CARE_PROVIDER_SITE_OTHER): Payer: Self-pay

## 2013-05-19 ENCOUNTER — Ambulatory Visit
Admission: RE | Admit: 2013-05-19 | Discharge: 2013-05-19 | Disposition: A | Discharge status: Home / Self Care | Payer: Medicare Other | Source: Ambulatory Visit

## 2013-05-19 DIAGNOSIS — Z1231 Encounter for screening mammogram for malignant neoplasm of breast: Secondary | ICD-10-CM

## 2013-06-09 ENCOUNTER — Telehealth: Payer: Self-pay | Admitting: Family Medicine

## 2013-06-09 DIAGNOSIS — E785 Hyperlipidemia, unspecified: Secondary | ICD-10-CM

## 2013-06-09 DIAGNOSIS — M109 Gout, unspecified: Secondary | ICD-10-CM

## 2013-06-09 DIAGNOSIS — E119 Type 2 diabetes mellitus without complications: Secondary | ICD-10-CM

## 2013-06-09 DIAGNOSIS — E559 Vitamin D deficiency, unspecified: Secondary | ICD-10-CM

## 2013-06-09 NOTE — Telephone Encounter (Signed)
Message copied by Jinny Sanders on Thu Jun 09, 2013  5:07 PM ------      Message from: Melanie Lang      Created: Mon Jun 06, 2013 11:13 AM      Regarding: Lab orders for Tuesday, 5.26.15       Lab orders for a 6 month f/u ------

## 2013-06-14 ENCOUNTER — Other Ambulatory Visit: Payer: Medicare Other

## 2013-06-15 ENCOUNTER — Other Ambulatory Visit (INDEPENDENT_AMBULATORY_CARE_PROVIDER_SITE_OTHER): Payer: Medicare Other

## 2013-06-15 DIAGNOSIS — M109 Gout, unspecified: Secondary | ICD-10-CM

## 2013-06-15 DIAGNOSIS — E785 Hyperlipidemia, unspecified: Secondary | ICD-10-CM

## 2013-06-15 DIAGNOSIS — E559 Vitamin D deficiency, unspecified: Secondary | ICD-10-CM

## 2013-06-15 DIAGNOSIS — E119 Type 2 diabetes mellitus without complications: Secondary | ICD-10-CM

## 2013-06-15 LAB — LIPID PANEL
CHOLESTEROL: 217 mg/dL — AB (ref 0–200)
HDL: 65.5 mg/dL (ref >39.00)
LDL Cholesterol: 131 mg/dL — ABNORMAL HIGH (ref 0–99)
TRIGLYCERIDES: 102 mg/dL (ref 0.0–149.0)
Total CHOL/HDL Ratio: 3
VLDL: 20.4 mg/dL (ref 0.0–40.0)

## 2013-06-15 LAB — COMPREHENSIVE METABOLIC PANEL
ALBUMIN: 3.6 g/dL (ref 3.5–5.2)
ALT: 17 U/L (ref 0–35)
AST: 22 U/L (ref 0–37)
Alkaline Phosphatase: 40 U/L (ref 39–117)
BUN: 17 mg/dL (ref 6–23)
CALCIUM: 9.7 mg/dL (ref 8.4–10.5)
CO2: 29 meq/L (ref 19–32)
Chloride: 101 mEq/L (ref 96–112)
Creatinine, Ser: 0.9 mg/dL (ref 0.4–1.2)
GFR: 79.1 mL/min (ref >60.00)
Glucose, Bld: 119 mg/dL — ABNORMAL HIGH (ref 70–99)
Potassium: 3.7 mEq/L (ref 3.5–5.1)
SODIUM: 138 meq/L (ref 135–145)
TOTAL PROTEIN: 7 g/dL (ref 6.0–8.3)
Total Bilirubin: 0.7 mg/dL (ref 0.2–1.2)

## 2013-06-15 LAB — URIC ACID: Uric Acid, Serum: 7.9 mg/dL — ABNORMAL HIGH (ref 2.4–7.0)

## 2013-06-15 LAB — HEMOGLOBIN A1C: Hgb A1c MFr Bld: 6.3 % (ref 4.6–6.5)

## 2013-06-16 LAB — VITAMIN D 25 HYDROXY (VIT D DEFICIENCY, FRACTURES): Vit D, 25-Hydroxy: 33 ng/mL (ref 30–89)

## 2013-06-21 ENCOUNTER — Encounter: Payer: Self-pay | Admitting: Family Medicine

## 2013-06-21 ENCOUNTER — Ambulatory Visit (INDEPENDENT_AMBULATORY_CARE_PROVIDER_SITE_OTHER): Payer: Medicare Other | Admitting: Family Medicine

## 2013-06-21 VITALS — BP 130/82 | HR 77 | Temp 98.3°F | Ht 64.0 in | Wt 207.8 lb

## 2013-06-21 DIAGNOSIS — M109 Gout, unspecified: Secondary | ICD-10-CM

## 2013-06-21 DIAGNOSIS — I10 Essential (primary) hypertension: Secondary | ICD-10-CM

## 2013-06-21 DIAGNOSIS — E119 Type 2 diabetes mellitus without complications: Secondary | ICD-10-CM

## 2013-06-21 DIAGNOSIS — E785 Hyperlipidemia, unspecified: Secondary | ICD-10-CM

## 2013-06-21 MED ORDER — FUROSEMIDE 20 MG PO TABS: ORAL_TABLET | ORAL | Status: DC

## 2013-06-21 NOTE — Assessment & Plan Note (Signed)
Well controlled on no medication. Encouraged exercise, weight loss, healthy eating habits.  

## 2013-06-21 NOTE — Assessment & Plan Note (Signed)
Poor control. Start red yeast rice. Pt now agreeable to do so.

## 2013-06-21 NOTE — Assessment & Plan Note (Signed)
Uric acid high, lots of "almost flares" Discussed low uric acid diet. Stop HCTZ. IF uric acid remains high and further flares... Consider allopurinol at next OV.

## 2013-06-21 NOTE — Assessment & Plan Note (Signed)
Given gout issue.. Will D/C maxide and change to lisinopril 40 mg daily.  Add lasix for peripheral edema prn.

## 2013-06-21 NOTE — Progress Notes (Signed)
   68 year old female presents for 6 month follow up DM.  Hypertension: well controlled on lisinopril and HCTZ  BP Readings from Last 3 Encounters:  06/21/13 130/82  12/21/12 130/76  06/10/12 130/70  Using medication without problems or lightheadedness: None  Chest pain with exertion:None  Edema:occ, better with support hose  Short of breath:None  Average home NWG:NFAOZHYQ daily 120-129/70-76  Other issues:   Diabetes: Well controlled with diet.  Lab Results  Component Value Date   HGBA1C 6.3 06/15/2013  Hypoglycemic episodes:?  Hyperglycemic episodes:?  Feet problems:none  Blood Sugars averaging:not checking  eye exam within last year: no, due   Elevated Cholesterol: LDL worsened some since last check.. Not at goal <100 on no medication.  Never started red yeast rice. Lab Results  Component Value Date   CHOL 217* 06/15/2013   HDL 65.50 06/15/2013   LDLCALC 131* 06/15/2013   LDLDIRECT 132.7 12/07/2012   TRIG 102.0 06/15/2013   CHOLHDL 3 06/15/2013  Diet compliance: Good.  Exercise: walking some.  Other complaints:  Wt Readings from Last 3 Encounters:  06/21/13 207 lb 12 oz (94.235 kg)  12/21/12 211 lb 8 oz (95.936 kg)  06/10/12 207 lb (93.895 kg)   Vit D now in nml range after repletion.   Gout: uric acid 7.9 Last gout flare 6 months ago. She feels like she has almost had several flares, but water has warded it off.  Review of Systems  Constitutional: Negative for fever and fatigue.  HENT: Negative for ear pain.  Eyes: Negative for pain.  Respiratory: Negative for chest tightness and shortness of breath.  Cardiovascular: Negative for chest pain, palpitations and leg swelling.  Gastrointestinal: Negative for abdominal pain.  Genitourinary: Negative for dysuria.  Objective:   Physical Exam  Constitutional: Vital signs are normal. She appears well-developed and well-nourished. She is cooperative. Non-toxic appearance. She does not appear ill. No distress.  HENT:  Head:  Normocephalic.  Right Ear: Hearing, tympanic membrane, external ear and ear canal normal.  Left Ear: Hearing, tympanic membrane, external ear and ear canal normal.  Nose: Nose normal.  Eyes: Conjunctivae normal, EOM and lids are normal. Pupils are equal, round, and reactive to light. No foreign bodies found.  Neck: Trachea normal and normal range of motion. Neck supple. Carotid bruit is not present. No mass and no thyromegaly present.  Cardiovascular: Normal rate, regular rhythm, S1 normal, S2 normal, normal heart sounds and intact distal pulses. Exam reveals no gallop.  No murmur heard.  Pulmonary/Chest: Effort normal and breath sounds normal. No respiratory distress. She has no wheezes. She has no rhonchi. She has no rales.  Abdominal: Soft. Normal appearance and bowel sounds are normal. She exhibits no distension, no fluid wave, no abdominal bruit and no mass. There is no hepatosplenomegaly. There is no tenderness. There is no rebound, no guarding and no CVA tenderness. No hernia.   Lymphadenopathy:  She has no cervical adenopathy.  She has no axillary adenopathy.  Neurological: She is alert. She has normal strength. No cranial nerve deficit or sensory deficit.  Skin: Skin is warm, dry and intact. No rash noted.  Psychiatric: Her speech is normal and behavior is normal. Judgment normal. Her mood appears not anxious. Cognition and memory are normal. She does not exhibit a depressed mood.   Diabetic foot exam:  Normal inspection  No skin breakdown  No calluses  Normal DP pulses  Normal sensation to light touch and monofilament  Nails normal

## 2013-06-21 NOTE — Progress Notes (Signed)
Pre visit review using our clinic review tool, if applicable. No additional management support is needed unless otherwise documented below in the visit note. 

## 2013-06-21 NOTE — Patient Instructions (Addendum)
Increase lisinopril to 40 mg daily. Follow BP daily, goal < 130/80. Stop HCTZ/triamterene. Use lasix 20 mg daily as needed for peripheral swelling. Start red yeast rice 600 mg 2 tabs twice daily. Can start with CoQ10.

## 2013-06-22 ENCOUNTER — Telehealth: Payer: Self-pay | Admitting: Family Medicine

## 2013-06-22 NOTE — Telephone Encounter (Signed)
Relevant patient education assigned to patient using Emmi. ° °

## 2013-12-07 LAB — HM DIABETES EYE EXAM

## 2013-12-19 ENCOUNTER — Emergency Department (HOSPITAL_COMMUNITY)
Admission: EM | Admit: 2013-12-19 | Discharge: 2013-12-19 | Disposition: A | Discharge status: Home / Self Care | Payer: Medicare Other | Attending: Emergency Medicine | Admitting: Emergency Medicine

## 2013-12-19 ENCOUNTER — Emergency Department (HOSPITAL_COMMUNITY): Payer: Medicare Other

## 2013-12-19 ENCOUNTER — Encounter (HOSPITAL_COMMUNITY): Payer: Self-pay | Admitting: *Deleted

## 2013-12-19 ENCOUNTER — Other Ambulatory Visit: Payer: Medicare Other

## 2013-12-19 DIAGNOSIS — Z8639 Personal history of other endocrine, nutritional and metabolic disease: Secondary | ICD-10-CM | POA: Insufficient documentation

## 2013-12-19 DIAGNOSIS — R51 Headache: Secondary | ICD-10-CM | POA: Insufficient documentation

## 2013-12-19 DIAGNOSIS — Z8739 Personal history of other diseases of the musculoskeletal system and connective tissue: Secondary | ICD-10-CM | POA: Insufficient documentation

## 2013-12-19 DIAGNOSIS — Z79899 Other long term (current) drug therapy: Secondary | ICD-10-CM | POA: Diagnosis not present

## 2013-12-19 DIAGNOSIS — I10 Essential (primary) hypertension: Secondary | ICD-10-CM | POA: Insufficient documentation

## 2013-12-19 DIAGNOSIS — J45909 Unspecified asthma, uncomplicated: Secondary | ICD-10-CM | POA: Diagnosis not present

## 2013-12-19 DIAGNOSIS — R519 Headache, unspecified: Secondary | ICD-10-CM

## 2013-12-19 LAB — BASIC METABOLIC PANEL
Anion gap: 16 — ABNORMAL HIGH (ref 5–15)
BUN: 11 mg/dL (ref 6–23)
CALCIUM: 10.2 mg/dL (ref 8.4–10.5)
CO2: 25 mEq/L (ref 19–32)
Chloride: 98 mEq/L (ref 96–112)
Creatinine, Ser: 0.69 mg/dL (ref 0.50–1.10)
GFR calc Af Amer: >90 mL/min (ref >90)
GFR, EST NON AFRICAN AMERICAN: 87 mL/min — AB (ref >90)
Glucose, Bld: 113 mg/dL — ABNORMAL HIGH (ref 70–99)
POTASSIUM: 4.1 meq/L (ref 3.7–5.3)
SODIUM: 139 meq/L (ref 137–147)

## 2013-12-19 LAB — I-STAT TROPONIN, ED: TROPONIN I, POC: 0 ng/mL (ref 0.00–0.08)

## 2013-12-19 LAB — CBC
HCT: 40.1 % (ref 36.0–46.0)
Hemoglobin: 13 g/dL (ref 12.0–15.0)
MCH: 27.8 pg (ref 26.0–34.0)
MCHC: 32.4 g/dL (ref 30.0–36.0)
MCV: 85.7 fL (ref 78.0–100.0)
Platelets: 308 10*3/uL (ref 150–400)
RBC: 4.68 MIL/uL (ref 3.87–5.11)
RDW: 13.4 % (ref 11.5–15.5)
WBC: 9 10*3/uL (ref 4.0–10.5)

## 2013-12-19 LAB — PRO B NATRIURETIC PEPTIDE: Pro B Natriuretic peptide (BNP): 170.2 pg/mL — ABNORMAL HIGH (ref 0–125)

## 2013-12-19 MED ORDER — LISINOPRIL 40 MG PO TABS: 40.0000 mg | ORAL_TABLET | Freq: Every day | ORAL | Status: DC

## 2013-12-19 NOTE — ED Notes (Signed)
Pt in from urgent care, sent here due to hypertension, reports she has had a headache for the last two days so she went there to get it checked, sent here for further evaluation, pt recently had BP medication reduced about 3 months ago, denies other symptoms

## 2013-12-19 NOTE — ED Provider Notes (Signed)
CSN: [id number]     Arrival date & time 12/19/13  1517 History   First MD Initiated Contact with Patient 12/19/13 1556     Chief Complaint  Patient presents with  . Hypertension     (Consider location/radiation/quality/duration/timing/severity/associated sxs/prior Treatment) Patient is a 68 y.o. female presenting with hypertension. The history is provided by the patient.  Hypertension This is a new problem. Associated symptoms include headaches. Pertinent negatives include no chest pain, no abdominal pain and no shortness of breath.   patient has had a dull headache for the last several days. It is by her left eye. She was trying to get into her primary care doctor was not able to today. She has had her blood pressure medication change recently. No chest pain but has had some palpitations. No confusion. She has slightly blurred vision, but this has been more chronic in her ophthalmologist said it is from small cataract. She was checked for glaucoma at that time. No photophobia or sonophobia. No fevers. No trauma. Her blood pressure had been doing well with her medications have been changed. Past Medical History  Diagnosis Date  . Arthritis   . Asthma   . Allergy   . Hyperlipidemia   . Hypertension    Past Surgical History  Procedure Laterality Date  . Tonsillectomy    . Abdominal hysterectomy    . Knee surgery     Family History  Problem Relation Age of Onset  . Asthma Mother   . Heart failure Father   . Stroke Father   . Cancer Maternal Grandmother     colon  . Diabetes Maternal Grandfather    History  Substance Use Topics  . Smoking status: Never Smoker   . Smokeless tobacco: Never Used  . Alcohol Use: No   OB History    No data available     Review of Systems  Constitutional: Negative for activity change and appetite change.  Eyes: Negative for pain.  Respiratory: Negative for chest tightness and shortness of breath.   Cardiovascular: Positive for palpitations.  Negative for chest pain and leg swelling.  Gastrointestinal: Negative for nausea, vomiting, abdominal pain and diarrhea.  Genitourinary: Negative for flank pain.  Musculoskeletal: Negative for back pain and neck stiffness.  Skin: Negative for rash.  Neurological: Positive for headaches. Negative for weakness and numbness.  Psychiatric/Behavioral: Negative for behavioral problems.      Allergies  Shellfish allergy; Eggs or egg-derived products; and Peanuts  Home Medications   Prior to Admission medications   Medication Sig Start Date End Date Taking? Authorizing Provider  Cholecalciferol (VITAMIN D3) 3000 UNITS TABS Take 1 tablet by mouth daily.   Yes Historical Provider, MD  furosemide (LASIX) 20 MG tablet Take 20 mg by mouth daily as needed for fluid.   Yes Historical Provider, MD  vitamin E 200 UNIT capsule Take 200 Units by mouth daily.   Yes Historical Provider, MD  furosemide (LASIX) 20 MG tablet 1 tab po daily as needed for swelling Patient not taking: Reported on 12/19/2013 06/21/13   Amy E Diona Browner, MD  lisinopril (PRINIVIL,ZESTRIL) 40 MG tablet Take 1 tablet (40 mg total) by mouth daily. 12/19/13   Jasper Riling. Toure Edmonds, MD   BP 164/89 mmHg  Pulse 87  Temp(Src) 98.7 F (37.1 C) (Oral)  Resp 18  Ht 5\' 5"  (1.651 m)  Wt 200 lb (90.719 kg)  BMI 33.28 kg/m2  SpO2 97% Physical Exam  Constitutional: She is oriented to person, place, and time.  She appears well-developed and well-nourished.  HENT:  Head: Normocephalic and atraumatic.  Eyes: EOM are normal. Pupils are equal, round, and reactive to light.  Neck: Normal range of motion. Neck supple.  Cardiovascular: Normal rate, regular rhythm and normal heart sounds.   No murmur heard. Pulmonary/Chest: Effort normal and breath sounds normal. No respiratory distress. She has no wheezes. She has no rales.  Abdominal: Soft. Bowel sounds are normal. She exhibits no distension. There is no tenderness. There is no rebound and no  guarding.  Musculoskeletal: Normal range of motion.  Neurological: She is alert and oriented to person, place, and time. No cranial nerve deficit.  Skin: Skin is warm and dry.  Psychiatric: She has a normal mood and affect. Her speech is normal.  Nursing note and vitals reviewed.   ED Course  Procedures (including critical care time) Labs Review Labs Reviewed  BASIC METABOLIC PANEL - Abnormal; Notable for the following:    Glucose, Bld 113 (*)    GFR calc non Af Amer 87 (*)    Anion gap 16 (*)    All other components within normal limits  PRO B NATRIURETIC PEPTIDE - Abnormal; Notable for the following:    Pro B Natriuretic peptide (BNP) 170.2 (*)    All other components within normal limits  CBC  I-STAT TROPOININ, ED    Imaging Review Ct Head Wo Contrast  12/19/2013   CLINICAL DATA:  Headache and left eye pain.  EXAM: CT HEAD WITHOUT CONTRAST  TECHNIQUE: Contiguous axial images were obtained from the base of the skull through the vertex without contrast.  COMPARISON:  12/07/2003  FINDINGS: Normal appearance of the intracranial structures. No evidence for acute hemorrhage, mass lesion, midline shift, hydrocephalus or large infarct. No acute bony abnormality. The visualized sinuses are clear.  IMPRESSION: Negative head CT.   Electronically Signed   By: Markus Daft M.D.   On: 12/19/2013 18:30     EKG Interpretation   Date/Time:  Monday December 19 2013 15:22:55 EST Ventricular Rate:  107 PR Interval:  146 QRS Duration: 82 QT Interval:  326 QTC Calculation: 435 R Axis:   42 Text Interpretation:  Sinus tachycardia Possible Anterior infarct , age  undetermined Abnormal ECG Confirmed by Alvino Chapel  MD, Ovid Curd [id number]) on  12/19/2013 3:57:29 PM      MDM   Final diagnoses:  Headache  Essential hypertension   patient with headache and hypertension. May have been taking less lisinopril and she was supposed to at 20 mg pills instead of 40 mg pills. Blood pressures improved  somewhat. No and organ damage. Head CT reassuring. Will have her take 40 mg and she is supposed be taking and discharged home to follow-up with her primary care doctor.    Jasper Riling. Alvino Chapel, MD 12/19/13 1918

## 2013-12-19 NOTE — Discharge Instructions (Signed)
Continue to take 40 mg a day of the lisinopril.  Hypertension Hypertension, commonly called high blood pressure, is when the force of blood pumping through your arteries is too strong. Your arteries are the blood vessels that carry blood from your heart throughout your body. A blood pressure reading consists of a higher number over a lower number, such as 110/72. The higher number (systolic) is the pressure inside your arteries when your heart pumps. The lower number (diastolic) is the pressure inside your arteries when your heart relaxes. Ideally you want your blood pressure below 120/80. Hypertension forces your heart to work harder to pump blood. Your arteries may become narrow or stiff. Having hypertension puts you at risk for heart disease, stroke, and other problems.  RISK FACTORS Some risk factors for high blood pressure are controllable. Others are not.  Risk factors you cannot control include:   Race. You may be at higher risk if you are African American.  Age. Risk increases with age.  Gender. Men are at higher risk than women before age 81 years. After age 14, women are at higher risk than men. Risk factors you can control include:  Not getting enough exercise or physical activity.  Being overweight.  Getting too much fat, sugar, calories, or salt in your diet.  Drinking too much alcohol. SIGNS AND SYMPTOMS Hypertension does not usually cause signs or symptoms. Extremely high blood pressure (hypertensive crisis) may cause headache, anxiety, shortness of breath, and nosebleed. DIAGNOSIS  To check if you have hypertension, your health care provider will measure your blood pressure while you are seated, with your arm held at the level of your heart. It should be measured at least twice using the same arm. Certain conditions can cause a difference in blood pressure between your right and left arms. A blood pressure reading that is higher than normal on one occasion does not mean that  you need treatment. If one blood pressure reading is high, ask your health care provider about having it checked again. TREATMENT  Treating high blood pressure includes making lifestyle changes and possibly taking medicine. Living a healthy lifestyle can help lower high blood pressure. You may need to change some of your habits. Lifestyle changes may include:  Following the DASH diet. This diet is high in fruits, vegetables, and whole grains. It is low in salt, red meat, and added sugars.  Getting at least 2 hours of brisk physical activity every week.  Losing weight if necessary.  Not smoking.  Limiting alcoholic beverages.  Learning ways to reduce stress. If lifestyle changes are not enough to get your blood pressure under control, your health care provider may prescribe medicine. You may need to take more than one. Work closely with your health care provider to understand the risks and benefits. HOME CARE INSTRUCTIONS  Have your blood pressure rechecked as directed by your health care provider.   Take medicines only as directed by your health care provider. Follow the directions carefully. Blood pressure medicines must be taken as prescribed. The medicine does not work as well when you skip doses. Skipping doses also puts you at risk for problems.   Do not smoke.   Monitor your blood pressure at home as directed by your health care provider. SEEK MEDICAL CARE IF:   You think you are having a reaction to medicines taken.  You have recurrent headaches or feel dizzy.  You have swelling in your ankles.  You have trouble with your vision. SEEK IMMEDIATE  MEDICAL CARE IF:  You develop a severe headache or confusion.  You have unusual weakness, numbness, or feel faint.  You have severe chest or abdominal pain.  You vomit repeatedly.  You have trouble breathing. MAKE SURE YOU:   Understand these instructions.  Will watch your condition.  Will get help right away if  you are not doing well or get worse. Document Released: 01/06/2005 Document Revised: 05/23/2013 Document Reviewed: 10/29/2012 [facility] Patient Information 2015 [place], [place]. This information is not intended to replace advice given to you by your health care provider. Make sure you discuss any questions you have with your health care provider.

## 2013-12-22 ENCOUNTER — Telehealth: Payer: Self-pay | Admitting: Family Medicine

## 2013-12-22 ENCOUNTER — Encounter: Payer: Medicare Other | Admitting: Family Medicine

## 2013-12-22 NOTE — Telephone Encounter (Signed)
Please schedule her on 12/30/2013 at 4 pm.

## 2013-12-22 NOTE — Telephone Encounter (Signed)
I did schedule pt for 12/11 @ 4pm for the ER follow up but Pt is requesting a call back regarding the medications for her BP. Her BP has been running 167/99 and is not sure what she should do regarding this.

## 2013-12-22 NOTE — Telephone Encounter (Signed)
PT was seen at [facility] ER on Monday 11/30 for hypertension and needs a follow up, but there is not another 30 min appointment until 12/17. Is it ok for her to wait until that appt? Pls advise.

## 2013-12-23 NOTE — Telephone Encounter (Signed)
Verify that she has been on 40mg  of lisinopril.  If so, then increase to 60mg  (1.5 tabs a day) and then f/u as scheduled.  Thanks.

## 2013-12-23 NOTE — Telephone Encounter (Signed)
Patient advised.

## 2013-12-28 ENCOUNTER — Telehealth: Payer: Self-pay | Admitting: Family Medicine

## 2013-12-28 DIAGNOSIS — E785 Hyperlipidemia, unspecified: Secondary | ICD-10-CM

## 2013-12-28 DIAGNOSIS — M1A9XX0 Chronic gout, unspecified, without tophus (tophi): Secondary | ICD-10-CM

## 2013-12-28 DIAGNOSIS — E119 Type 2 diabetes mellitus without complications: Secondary | ICD-10-CM

## 2013-12-28 DIAGNOSIS — E559 Vitamin D deficiency, unspecified: Secondary | ICD-10-CM

## 2013-12-28 DIAGNOSIS — M8580 Other specified disorders of bone density and structure, unspecified site: Secondary | ICD-10-CM

## 2013-12-28 NOTE — Telephone Encounter (Signed)
-----   Message from Ellamae Sia sent at 12/21/2013 12:52 PM EST ----- Regarding: Lab orders for Thursday, 12.10.15 Patient is scheduled for CPX labs, please order future labs, Thanks , Karna Christmas

## 2013-12-29 ENCOUNTER — Other Ambulatory Visit (INDEPENDENT_AMBULATORY_CARE_PROVIDER_SITE_OTHER): Payer: Medicare Other

## 2013-12-29 DIAGNOSIS — E119 Type 2 diabetes mellitus without complications: Secondary | ICD-10-CM

## 2013-12-29 DIAGNOSIS — M1A9XX0 Chronic gout, unspecified, without tophus (tophi): Secondary | ICD-10-CM

## 2013-12-29 DIAGNOSIS — E785 Hyperlipidemia, unspecified: Secondary | ICD-10-CM

## 2013-12-29 LAB — COMPREHENSIVE METABOLIC PANEL
ALBUMIN: 3.8 g/dL (ref 3.5–5.2)
ALT: 12 U/L (ref 0–35)
AST: 20 U/L (ref 0–37)
Alkaline Phosphatase: 50 U/L (ref 39–117)
BUN: 13 mg/dL (ref 6–23)
CALCIUM: 9.3 mg/dL (ref 8.4–10.5)
CHLORIDE: 101 meq/L (ref 96–112)
CO2: 29 mEq/L (ref 19–32)
CREATININE: 0.8 mg/dL (ref 0.4–1.2)
GFR: 91.63 mL/min (ref >60.00)
Glucose, Bld: 106 mg/dL — ABNORMAL HIGH (ref 70–99)
POTASSIUM: 3.4 meq/L — AB (ref 3.5–5.1)
Sodium: 137 mEq/L (ref 135–145)
Total Bilirubin: 0.8 mg/dL (ref 0.2–1.2)
Total Protein: 7.1 g/dL (ref 6.0–8.3)

## 2013-12-29 LAB — LIPID PANEL
CHOL/HDL RATIO: 3
CHOLESTEROL: 203 mg/dL — AB (ref 0–200)
HDL: 68.2 mg/dL (ref >39.00)
LDL Cholesterol: 118 mg/dL — ABNORMAL HIGH (ref 0–99)
NonHDL: 134.8
TRIGLYCERIDES: 82 mg/dL (ref 0.0–149.0)
VLDL: 16.4 mg/dL (ref 0.0–40.0)

## 2013-12-29 LAB — URIC ACID: URIC ACID, SERUM: 6.9 mg/dL (ref 2.4–7.0)

## 2013-12-29 LAB — HEMOGLOBIN A1C: Hgb A1c MFr Bld: 6.1 % (ref 4.6–6.5)

## 2013-12-30 ENCOUNTER — Ambulatory Visit (INDEPENDENT_AMBULATORY_CARE_PROVIDER_SITE_OTHER): Payer: Medicare Other | Admitting: Family Medicine

## 2013-12-30 ENCOUNTER — Encounter: Payer: Self-pay | Admitting: Family Medicine

## 2013-12-30 VITALS — BP 164/90 | HR 85 | Temp 98.3°F | Ht 64.0 in | Wt 202.5 lb

## 2013-12-30 DIAGNOSIS — I10 Essential (primary) hypertension: Secondary | ICD-10-CM

## 2013-12-30 MED ORDER — AMLODIPINE BESYLATE 5 MG PO TABS: 5.0000 mg | ORAL_TABLET | Freq: Every day | ORAL | Status: DC

## 2013-12-30 NOTE — Progress Notes (Signed)
   Subjective:    Patient ID: Melanie Lang, female    DOB: 10-21-1945, 68 y.o.   MRN: [id number]  HPI  68 year old female presents for ER follow up. She was seen at [facility] on 11/30 for elevated BP 187/89. She panicked and it went down. Associated symptoms included headaches. Pertinent negatives included  no chest pain, no abdominal pain and no shortness of breath.   She was incorrectly taking lisinopril 20 mg instead of 40 mg daily.  The pharmacy gave her the wrong med.  She is using lasix 20 mg daily as needed for swelling. She is no longer on maxide given gout flares.  BP Readings from Last 3 Encounters:  12/30/13 164/90  12/19/13 165/77  06/21/13 130/82  She has been taking 60 mg in last week. Since 11/30 BP has been 158-169/93-106, improved some on last few days with 60 mg lisinopril. No SE to  ACE I like cough. Headache resolved.  Review of Systems  Constitutional: Negative for fever and fatigue.  HENT: Negative for ear pain.   Eyes: Negative for pain.  Respiratory: Negative for chest tightness and shortness of breath.   Cardiovascular: Negative for chest pain, palpitations and leg swelling.  Gastrointestinal: Negative for abdominal pain.  Genitourinary: Negative for dysuria.       Objective:   Physical Exam  Constitutional: Vital signs are normal. She appears well-developed and well-nourished. She is cooperative.  Non-toxic appearance. She does not appear ill. No distress.  HENT:  Head: Normocephalic.  Right Ear: Hearing, tympanic membrane, external ear and ear canal normal. Tympanic membrane is not erythematous, not retracted and not bulging.  Left Ear: Hearing, tympanic membrane, external ear and ear canal normal. Tympanic membrane is not erythematous, not retracted and not bulging.  Nose: No mucosal edema or rhinorrhea. Right sinus exhibits no maxillary sinus tenderness and no frontal sinus tenderness. Left sinus exhibits no maxillary sinus tenderness and no  frontal sinus tenderness.  Mouth/Throat: Uvula is midline, oropharynx is clear and moist and mucous membranes are normal.  Eyes: Conjunctivae, EOM and lids are normal. Pupils are equal, round, and reactive to light. Lids are everted and swept, no foreign bodies found.  Neck: Trachea normal and normal range of motion. Neck supple. Carotid bruit is not present. No thyroid mass and no thyromegaly present.  Cardiovascular: Normal rate, regular rhythm, S1 normal, S2 normal, normal heart sounds, intact distal pulses and normal pulses.  Exam reveals no gallop and no friction rub.   No murmur heard. Pulmonary/Chest: Effort normal and breath sounds normal. No tachypnea. No respiratory distress. She has no decreased breath sounds. She has no wheezes. She has no rhonchi. She has no rales.  Abdominal: Soft. Normal appearance and bowel sounds are normal. There is no tenderness.  Neurological: She is alert.  Skin: Skin is warm, dry and intact. No rash noted.  Psychiatric: Her speech is normal and behavior is normal. Judgment and thought content normal. Her mood appears not anxious. Cognition and memory are normal. She does not exhibit a depressed mood.          Assessment & Plan:

## 2013-12-30 NOTE — Progress Notes (Signed)
Pre visit review using our clinic review tool, if applicable. No additional management support is needed unless otherwise documented below in the visit note. 

## 2013-12-30 NOTE — Patient Instructions (Signed)
Decrease lisinopril to 1 tab of 40 mg daily.  Start amlodipine 5 mg daily.  Follow BP at home.  Follow up recheck HTN in 2 weeks.  Follow BP at home, and bring in the measurements.

## 2013-12-30 NOTE — Assessment & Plan Note (Signed)
60 mg not helping much more than 40 mg lisinopril. Decrease back to 40 mg daily and add amlodipine 5 mg daily. Follow BP at home, close follow up in 2 weeks.

## 2014-01-06 ENCOUNTER — Encounter: Payer: Self-pay | Admitting: Family Medicine

## 2014-01-06 ENCOUNTER — Ambulatory Visit (INDEPENDENT_AMBULATORY_CARE_PROVIDER_SITE_OTHER): Payer: Medicare Other | Admitting: Family Medicine

## 2014-01-06 VITALS — BP 170/97 | HR 81 | Temp 98.5°F | Ht 63.75 in | Wt 199.8 lb

## 2014-01-06 DIAGNOSIS — Z7189 Other specified counseling: Secondary | ICD-10-CM

## 2014-01-06 DIAGNOSIS — E785 Hyperlipidemia, unspecified: Secondary | ICD-10-CM

## 2014-01-06 DIAGNOSIS — I10 Essential (primary) hypertension: Secondary | ICD-10-CM

## 2014-01-06 DIAGNOSIS — Z0000 Encounter for general adult medical examination without abnormal findings: Secondary | ICD-10-CM

## 2014-01-06 DIAGNOSIS — E119 Type 2 diabetes mellitus without complications: Secondary | ICD-10-CM

## 2014-01-06 LAB — HM DIABETES FOOT EXAM

## 2014-01-06 MED ORDER — AMLODIPINE BESYLATE 10 MG PO TABS: 10.0000 mg | ORAL_TABLET | Freq: Every day | ORAL | Status: DC

## 2014-01-06 NOTE — Assessment & Plan Note (Signed)
Excellent control on no med.

## 2014-01-06 NOTE — Progress Notes (Signed)
Pre visit review using our clinic review tool, if applicable. No additional management support is needed unless otherwise documented below in the visit note. 

## 2014-01-06 NOTE — Assessment & Plan Note (Signed)
Inadequate control on occ red yeast rice. Take daily and increase to 2 tabs twice daily. Encouraged exercise, weight loss, healthy eating habits.

## 2014-01-06 NOTE — Assessment & Plan Note (Signed)
Increase amlodipine to 10 mg daily. Continue lisinopril 40 mg daily. Can use lasix as needed for swelling.

## 2014-01-06 NOTE — Patient Instructions (Addendum)
Increase amlodipine to 10 mg daily. Continue lisinopril 40 mg daily. Can use lasix as needed for swelling.  Follow BP at home, call if remaining > 140/90 after 1 week.  Increase red yeast rice to 2 tabs twice daily. Increase potassium in diet.  Call if you are needing lasix more so we can get you potassium pill to take with it. Call  If interested in new pneumonia vaccine PCV13 or prevnar.

## 2014-01-06 NOTE — Progress Notes (Signed)
HPI  I have personally reviewed the Medicare Annual Wellness questionnaire and have noted  1. The patient's medical and social history  2. Their use of alcohol, tobacco or illicit drugs  3. Their current medications and supplements  4. The patient's functional ability including ADL's, fall risks, home safety risks and hearing or visual  impairment.  5. Diet and physical activities  6. Evidence for depression or mood disorders  The patients weight, height, BMI and visual acuity have been recorded in the chart  I have made referrals, counseling and provided education to the patient based review of the above and I have provided the pt with a written personalized care plan for preventive services.   Hypertension: Poor control here on lisinopril  And now on amlodipine 5 mg daily . She feels anxious today 156-160/98 ( Has been out of control since stopping HCTZ for gout flares) BP Readings from Last 3 Encounters:  01/06/14 170/97  12/30/13 164/90  12/19/13 165/77  Using medication without problems or lightheadedness: None  Chest pain with exertion:None  Edema:occ, better with support hose  Short of breath:None  Average home [id number] daily  Other issues:   Diabetes: Well controlled with diet.  Lab Results  Component Value Date   HGBA1C 6.1 12/29/2013  Hypoglycemic episodes:?  Hyperglycemic episodes:?  Feet problems:none  Blood Sugars averaging:not checking  eye exam within last year: yes Elevated Cholesterol: LDL not at goal <100 but some better on red yeast rice . She forgets a lot. Lab Results  Component Value Date   CHOL 203* 12/29/2013   HDL 68.20 12/29/2013   LDLCALC 118* 12/29/2013   LDLDIRECT 132.7 12/07/2012   TRIG 82.0 12/29/2013   CHOLHDL 3 12/29/2013  Diet compliance: Good.  Exercise: walking some. Other complaints:    reports that she has never smoked. She has never used smokeless tobacco. She reports that she does not drink alcohol or use  illicit drugs.   Review of Systems  Constitutional: Negative for fever and fatigue.  HENT: Negative for ear pain.  Eyes: Negative for pain.  Respiratory: Negative for chest tightness and shortness of breath.  Cardiovascular: Negative for chest pain, palpitations and leg swelling.  Gastrointestinal: Negative for abdominal pain.  Genitourinary: Negative for dysuria.  Objective:   Physical Exam  Constitutional: Vital signs are normal. She appears well-developed and well-nourished. She is cooperative. Non-toxic appearance. She does not appear ill. No distress.  HENT:  Head: Normocephalic.  Right Ear: Hearing, tympanic membrane, external ear and ear canal normal.  Left Ear: Hearing, tympanic membrane, external ear and ear canal normal.  Nose: Nose normal.  Eyes: Conjunctivae normal, EOM and lids are normal. Pupils are equal, round, and reactive to light. No foreign bodies found.  Neck: Trachea normal and normal range of motion. Neck supple. Carotid bruit is not present. No mass and no thyromegaly present.  Cardiovascular: Normal rate, regular rhythm, S1 normal, S2 normal, normal heart sounds and intact distal pulses. Exam reveals no gallop.  No murmur heard.  Pulmonary/Chest: Effort normal and breath sounds normal. No respiratory distress. She has no wheezes. She has no rhonchi. She has no rales.  Abdominal: Soft. Normal appearance and bowel sounds are normal. She exhibits no distension, no fluid wave, no abdominal bruit and no mass. There is no hepatosplenomegaly. There is no tenderness. There is no rebound, no guarding and no CVA tenderness. No hernia.  Genitourinary: No breast swelling, tenderness, discharge or bleeding.  Lymphadenopathy:  She has no cervical adenopathy.  She has no axillary adenopathy.  Neurological: She is alert. She has normal strength. No cranial nerve deficit or sensory deficit.  Skin: Skin is warm, dry and intact. No rash noted.  Psychiatric: Her  speech is normal and behavior is normal. Judgment normal. Her mood appears not anxious. Cognition and memory are normal. She does not exhibit a depressed mood.   Diabetic foot exam:  Normal inspection  No skin breakdown  No calluses  Normal DP pulses  Normal sensation to light touch and monofilament  Nails normal  Assessment & Plan:   The patient's preventative maintenance and recommended screening tests for an annual wellness exam were reviewed in full today.  Brought up to date unless services declined.  Counselled on the importance of diet, exercise, and its role in overall health and mortality.  The patient's FH and SH was reviewed, including their home life, tobacco status, and drug and alcohol status.   Vaccines: refused/allergic flu, uptodate with PCV 23 due for PCV13  And shingles Colon: 3 polyps 12/2013 Dr. Watt Climes. Repeat in 5 years. Mammogram: Last nml in 04/2013. DXA: osteopenia 11/2010, on vit D,  Pap/DVE: not indicated total hysterectomy for non cancer indication.  non smoker

## 2014-01-27 ENCOUNTER — Encounter: Payer: Self-pay | Admitting: Family Medicine

## 2014-02-15 ENCOUNTER — Other Ambulatory Visit: Payer: Self-pay | Admitting: Family Medicine

## 2014-04-07 ENCOUNTER — Other Ambulatory Visit: Payer: Self-pay | Admitting: Family Medicine

## 2014-05-03 ENCOUNTER — Other Ambulatory Visit: Payer: Self-pay | Admitting: *Deleted

## 2014-05-03 MED ORDER — FUROSEMIDE 20 MG PO TABS: 20.0000 mg | ORAL_TABLET | Freq: Every day | ORAL | Status: DC | PRN

## 2014-05-12 ENCOUNTER — Other Ambulatory Visit: Payer: Self-pay

## 2014-05-12 DIAGNOSIS — Z1231 Encounter for screening mammogram for malignant neoplasm of breast: Secondary | ICD-10-CM

## 2014-05-24 ENCOUNTER — Encounter (INDEPENDENT_AMBULATORY_CARE_PROVIDER_SITE_OTHER): Payer: Self-pay

## 2014-05-24 ENCOUNTER — Ambulatory Visit
Admission: RE | Admit: 2014-05-24 | Discharge: 2014-05-24 | Disposition: A | Discharge status: Home / Self Care | Payer: Medicare Other | Source: Ambulatory Visit

## 2014-05-24 DIAGNOSIS — Z1231 Encounter for screening mammogram for malignant neoplasm of breast: Secondary | ICD-10-CM | POA: Diagnosis not present

## 2014-05-29 ENCOUNTER — Telehealth: Payer: Self-pay | Admitting: Family Medicine

## 2014-05-29 NOTE — Telephone Encounter (Signed)
Noted  

## 2014-05-29 NOTE — Telephone Encounter (Signed)
The DSS forms patient dropped off is for her and her husband to fill out.  There is nothing on these forms that Dr. Diona Browner needs to complete.  Patient also dropped off a handicap placard application.  Form completed and placed in Dr. Rometta Emery in box to sign.

## 2014-05-29 NOTE — Telephone Encounter (Signed)
Miss Hildenbrand dropped off foster care forms to be completed and signed by Dr. Diona Browner   Forms in Donna's inbox

## 2014-06-02 ENCOUNTER — Telehealth: Payer: Self-pay | Admitting: Family Medicine

## 2014-06-02 NOTE — Telephone Encounter (Signed)
Form in outbox. Print Pt problem  list to attach.

## 2014-06-02 NOTE — Telephone Encounter (Signed)
Pt states returning Dr. Rometta Emery call re her forms, return # [phone] / lt

## 2014-06-02 NOTE — Telephone Encounter (Signed)
DSS form placed in Dr. Bedsole's in box for completion. 

## 2014-06-02 NOTE — Telephone Encounter (Signed)
Pt dropped off [employer] forms to be filled out.Melanie Lang  Please call [phone] when ready to be picked up, thanks

## 2014-06-05 DIAGNOSIS — Z7689 Persons encountering health services in other specified circumstances: Secondary | ICD-10-CM

## 2014-06-05 NOTE — Telephone Encounter (Signed)
Melanie Lang notified DSS form is ready to be picked up at the front desk.

## 2014-06-19 LAB — HM DIABETES EYE EXAM

## 2014-07-11 ENCOUNTER — Ambulatory Visit (INDEPENDENT_AMBULATORY_CARE_PROVIDER_SITE_OTHER): Payer: Medicare Other | Admitting: Family Medicine

## 2014-07-11 ENCOUNTER — Encounter: Payer: Self-pay | Admitting: Family Medicine

## 2014-07-11 ENCOUNTER — Telehealth: Payer: Self-pay | Admitting: Family Medicine

## 2014-07-11 ENCOUNTER — Ambulatory Visit: Payer: Medicare Other | Admitting: Family Medicine

## 2014-07-11 VITALS — BP 154/92 | HR 73 | Temp 98.3°F | Ht 63.75 in | Wt 202.2 lb

## 2014-07-11 DIAGNOSIS — E119 Type 2 diabetes mellitus without complications: Secondary | ICD-10-CM | POA: Diagnosis not present

## 2014-07-11 DIAGNOSIS — M79672 Pain in left foot: Secondary | ICD-10-CM | POA: Diagnosis not present

## 2014-07-11 DIAGNOSIS — E785 Hyperlipidemia, unspecified: Secondary | ICD-10-CM | POA: Diagnosis not present

## 2014-07-11 DIAGNOSIS — I10 Essential (primary) hypertension: Secondary | ICD-10-CM | POA: Diagnosis not present

## 2014-07-11 LAB — URIC ACID: Uric Acid, Serum: 6 mg/dL (ref 2.4–7.0)

## 2014-07-11 LAB — LIPID PANEL
CHOLESTEROL: 234 mg/dL — AB (ref 0–200)
HDL: 68.9 mg/dL (ref >39.00)
LDL Cholesterol: 146 mg/dL — ABNORMAL HIGH (ref 0–99)
NonHDL: 165.1
TRIGLYCERIDES: 97 mg/dL (ref 0.0–149.0)
Total CHOL/HDL Ratio: 3
VLDL: 19.4 mg/dL (ref 0.0–40.0)

## 2014-07-11 LAB — COMPREHENSIVE METABOLIC PANEL
ALT: 12 U/L (ref 0–35)
AST: 18 U/L (ref 0–37)
Albumin: 4 g/dL (ref 3.5–5.2)
Alkaline Phosphatase: 77 U/L (ref 39–117)
BILIRUBIN TOTAL: 0.7 mg/dL (ref 0.2–1.2)
BUN: 14 mg/dL (ref 6–23)
CHLORIDE: 101 meq/L (ref 96–112)
CO2: 31 mEq/L (ref 19–32)
Calcium: 9.8 mg/dL (ref 8.4–10.5)
Creatinine, Ser: 0.7 mg/dL (ref 0.40–1.20)
GFR: 106.73 mL/min (ref >60.00)
Glucose, Bld: 105 mg/dL — ABNORMAL HIGH (ref 70–99)
POTASSIUM: 3.7 meq/L (ref 3.5–5.1)
SODIUM: 137 meq/L (ref 135–145)
Total Protein: 7.4 g/dL (ref 6.0–8.3)

## 2014-07-11 LAB — HM DIABETES FOOT EXAM

## 2014-07-11 LAB — HEMOGLOBIN A1C: Hgb A1c MFr Bld: 5.9 % (ref 4.6–6.5)

## 2014-07-11 MED ORDER — LISINOPRIL 40 MG PO TABS: 40.0000 mg | ORAL_TABLET | Freq: Every day | ORAL | Status: DC

## 2014-07-11 NOTE — Assessment & Plan Note (Signed)
Due for a1C.Marland Kitchen Likely well controlleld.  Encouraged exercise, weight loss, healthy eating habits.

## 2014-07-11 NOTE — Patient Instructions (Addendum)
Stop at lab on way out. Start home stretches (review info), ice foot, elevate it. Remove insert for now. Can uses ibuprofen 800 mg with a meal every 8 hours for pain and swelling in the short term.

## 2014-07-11 NOTE — Telephone Encounter (Signed)
Pt called to let you know that her lisinopril is 40 mg not 20 like she told you this morning  [facility] [address] [place]

## 2014-07-11 NOTE — Assessment & Plan Note (Signed)
Liely foot sprain. Treat with short term anti-inflammatory, ice, stretching and elevation.

## 2014-07-11 NOTE — Telephone Encounter (Signed)
Med list updated and refill sent to pharmacy

## 2014-07-11 NOTE — Assessment & Plan Note (Signed)
Due for re-eval. 

## 2014-07-11 NOTE — Progress Notes (Signed)
Subjective:    Patient ID: Melanie Lang, female    DOB: [birth year], 69 y.o.   MRN: [id number]  HPI Hypertension: Poor control today but she has not taken her meds yet today as she thought she was just having labs only.  At home BP has been better controlled on lisinopril 20 and amlodipine 10 mg daily. BP Readings from Last 3 Encounters:   BP Readings from Last 3 Encounters:  07/11/14 154/92  01/06/14 170/97  12/30/13 164/90   Using medication without problems or lightheadedness: None  Chest pain with exertion:None  Edema:occ, better with support hose  Short of breath:None  Average home [id number] daily  140/78 Other issues:   Diabetes: Well controlled with diet. at last check, labs pending  Recent Labs    Lab Results  Component Value Date   HGBA1C 6.1 12/29/2013    Hypoglycemic episodes:?  Hyperglycemic episodes:?  Feet problems:pain in left foot, no ulcers Blood Sugars averaging:not checking  eye exam within last year: yes  Elevated Cholesterol:  Labs pending, but  at last check: LDL not at goal <100 but some better on red yeast rice .   Recent Labs    Lab Results  Component Value Date   CHOL 203* 12/29/2013   HDL 68.20 12/29/2013   LDLCALC 118* 12/29/2013   LDLDIRECT 132.7 12/07/2012   TRIG 82.0 12/29/2013   CHOLHDL 3 12/29/2013    Diet compliance: Good.  Exercise: walking prior to recent foot pain as well as chronic knee pain. Other complaints:            She has been having pain left foot for 4 days.  Started with soreness in left post heel. Got inser.. Then moved to left lateral foot. Only pain with walking not with standing.  No falls or injury.  She does not believe it is gout because it is not hot or red, not sensitive to touch. 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 Knee was not aligned well.. changes gait and foot rolls out. Causes foot pain intermittantly.  Social  History /Family History/Past Medical History reviewed and updated if needed.    Review of Systems Constitutional: Negative for fever and fatigue.  HENT: Negative for ear pain.  Eyes: Negative for pain.  Respiratory: Negative for chest tightness and shortness of breath.  Cardiovascular: Negative for chest pain, palpitations and leg swelling.  Gastrointestinal: Negative for abdominal pain.  Genitourinary: Negative for dysuria.    Objective:   Physical Exam  Constitutional: Vital signs are normal. She appears well-developed and well-nourished. She is cooperative.  Non-toxic appearance. She does not appear ill. No distress.  HENT:  Head: Normocephalic.  Right Ear: Hearing, tympanic membrane, external ear and ear canal normal. Tympanic membrane is not erythematous, not retracted and not bulging.  Left Ear: Hearing, tympanic membrane, external ear and ear canal normal. Tympanic membrane is not erythematous, not retracted and not bulging.  Nose: No mucosal edema or rhinorrhea. Right sinus exhibits no maxillary sinus tenderness and no frontal sinus tenderness. Left sinus exhibits no maxillary sinus tenderness and no frontal sinus tenderness.  Mouth/Throat: Uvula is midline, oropharynx is clear and moist and mucous membranes are normal.  Eyes: Conjunctivae, EOM and lids are normal. Pupils are equal, round, and reactive to light. Lids are everted and swept, no foreign bodies found.  Neck: Trachea normal and normal range of motion. Neck supple. Carotid bruit is not present. No thyroid mass and no thyromegaly present.  Cardiovascular:  Normal rate, regular rhythm, S1 normal, S2 normal, normal heart sounds, intact distal pulses and normal pulses.  Exam reveals no gallop and no friction rub.   No murmur heard. Pulmonary/Chest: Effort normal and breath sounds normal. No tachypnea. No respiratory distress. She has no decreased breath sounds. She has no wheezes. She has no rhonchi. She has no rales.    Abdominal: Soft. Normal appearance and bowel sounds are normal. There is no tenderness.  Musculoskeletal:       Left ankle: She exhibits swelling. She exhibits normal range of motion. Tenderness. Lateral malleolus tenderness found. No medial malleolus, no AITFL, no CF ligament, no posterior TFL, no head of 5th metatarsal and no proximal fibula tenderness found. Achilles tendon normal.       Left foot: There is tenderness. There is normal range of motion and no bony tenderness.       Feet:  Neurological: She is alert.  Skin: Skin is warm, dry and intact. No rash noted.  Psychiatric: Her speech is normal and behavior is normal. Judgment and thought content normal. Her mood appears not anxious. Cognition and memory are normal. She does not exhibit a depressed mood.          Assessment & Plan:

## 2014-07-11 NOTE — Assessment & Plan Note (Signed)
Did not take medication today.  BP well controlled at home.

## 2014-07-11 NOTE — Progress Notes (Signed)
Pre visit review using our clinic review tool, if applicable. No additional management support is needed unless otherwise documented below in the visit note. 

## 2014-09-05 ENCOUNTER — Ambulatory Visit: Payer: Medicare Other | Admitting: Family Medicine

## 2014-10-09 ENCOUNTER — Other Ambulatory Visit: Payer: Self-pay | Admitting: Family Medicine

## 2014-10-09 DIAGNOSIS — E785 Hyperlipidemia, unspecified: Secondary | ICD-10-CM

## 2014-10-16 ENCOUNTER — Other Ambulatory Visit (INDEPENDENT_AMBULATORY_CARE_PROVIDER_SITE_OTHER): Payer: Medicare Other

## 2014-10-16 DIAGNOSIS — E785 Hyperlipidemia, unspecified: Secondary | ICD-10-CM | POA: Diagnosis not present

## 2014-10-16 LAB — LIPID PANEL
CHOLESTEROL: 216 mg/dL — AB (ref 0–200)
HDL: 66.9 mg/dL (ref >39.00)
LDL Cholesterol: 128 mg/dL — ABNORMAL HIGH (ref 0–99)
NONHDL: 148.84
Total CHOL/HDL Ratio: 3
Triglycerides: 104 mg/dL (ref 0.0–149.0)
VLDL: 20.8 mg/dL (ref 0.0–40.0)

## 2014-10-19 MED ORDER — ATORVASTATIN CALCIUM 20 MG PO TABS: 20.0000 mg | ORAL_TABLET | Freq: Every day | ORAL | Status: DC

## 2014-10-19 NOTE — Addendum Note (Signed)
Addended by: Carter Kitten on: 10/19/2014 10:47 AM   Modules accepted: Orders

## 2014-10-25 DIAGNOSIS — R200 Anesthesia of skin: Secondary | ICD-10-CM | POA: Diagnosis not present

## 2014-10-25 DIAGNOSIS — M79641 Pain in right hand: Secondary | ICD-10-CM | POA: Diagnosis not present

## 2014-10-25 DIAGNOSIS — G5602 Carpal tunnel syndrome, left upper limb: Secondary | ICD-10-CM | POA: Diagnosis not present

## 2014-10-25 DIAGNOSIS — M79642 Pain in left hand: Secondary | ICD-10-CM | POA: Diagnosis not present

## 2014-10-30 DIAGNOSIS — M79641 Pain in right hand: Secondary | ICD-10-CM | POA: Diagnosis not present

## 2014-10-30 DIAGNOSIS — M79642 Pain in left hand: Secondary | ICD-10-CM | POA: Diagnosis not present

## 2014-10-31 ENCOUNTER — Other Ambulatory Visit: Payer: Self-pay | Admitting: Family Medicine

## 2014-11-06 DIAGNOSIS — G5603 Carpal tunnel syndrome, bilateral upper limbs: Secondary | ICD-10-CM | POA: Diagnosis not present

## 2014-11-17 DIAGNOSIS — T8484XA Pain due to internal orthopedic prosthetic devices, implants and grafts, initial encounter: Secondary | ICD-10-CM | POA: Diagnosis not present

## 2014-11-17 DIAGNOSIS — Z96652 Presence of left artificial knee joint: Secondary | ICD-10-CM | POA: Diagnosis not present

## 2014-12-04 DIAGNOSIS — E1142 Type 2 diabetes mellitus with diabetic polyneuropathy: Secondary | ICD-10-CM | POA: Diagnosis not present

## 2014-12-04 DIAGNOSIS — L97511 Non-pressure chronic ulcer of other part of right foot limited to breakdown of skin: Secondary | ICD-10-CM | POA: Diagnosis not present

## 2014-12-04 DIAGNOSIS — B351 Tinea unguium: Secondary | ICD-10-CM | POA: Diagnosis not present

## 2014-12-04 DIAGNOSIS — D2372 Other benign neoplasm of skin of left lower limb, including hip: Secondary | ICD-10-CM | POA: Diagnosis not present

## 2014-12-18 ENCOUNTER — Other Ambulatory Visit: Payer: Self-pay | Admitting: Family Medicine

## 2014-12-19 DIAGNOSIS — G5601 Carpal tunnel syndrome, right upper limb: Secondary | ICD-10-CM | POA: Diagnosis not present

## 2014-12-27 DIAGNOSIS — Z4789 Encounter for other orthopedic aftercare: Secondary | ICD-10-CM | POA: Diagnosis not present

## 2015-01-02 DIAGNOSIS — G5601 Carpal tunnel syndrome, right upper limb: Secondary | ICD-10-CM | POA: Diagnosis not present

## 2015-01-09 ENCOUNTER — Telehealth: Payer: Self-pay | Admitting: Family Medicine

## 2015-01-09 DIAGNOSIS — M1A9XX0 Chronic gout, unspecified, without tophus (tophi): Secondary | ICD-10-CM

## 2015-01-09 DIAGNOSIS — Z1159 Encounter for screening for other viral diseases: Secondary | ICD-10-CM

## 2015-01-09 DIAGNOSIS — E119 Type 2 diabetes mellitus without complications: Secondary | ICD-10-CM

## 2015-01-09 DIAGNOSIS — M8580 Other specified disorders of bone density and structure, unspecified site: Secondary | ICD-10-CM

## 2015-01-09 DIAGNOSIS — E559 Vitamin D deficiency, unspecified: Secondary | ICD-10-CM

## 2015-01-09 DIAGNOSIS — E785 Hyperlipidemia, unspecified: Secondary | ICD-10-CM

## 2015-01-09 NOTE — Telephone Encounter (Signed)
-----   Message from Ellamae Sia sent at 01/04/2015 10:11 AM EST ----- Regarding: Lab orders for Wednesday, 12.21.16 Patient is scheduled for CPX labs, please order future labs, Thanks , Karna Christmas

## 2015-01-11 ENCOUNTER — Other Ambulatory Visit (INDEPENDENT_AMBULATORY_CARE_PROVIDER_SITE_OTHER): Payer: Medicare Other

## 2015-01-11 ENCOUNTER — Other Ambulatory Visit: Payer: Self-pay | Admitting: Family Medicine

## 2015-01-11 DIAGNOSIS — E119 Type 2 diabetes mellitus without complications: Secondary | ICD-10-CM

## 2015-01-11 DIAGNOSIS — E559 Vitamin D deficiency, unspecified: Secondary | ICD-10-CM | POA: Diagnosis not present

## 2015-01-11 DIAGNOSIS — Z1159 Encounter for screening for other viral diseases: Secondary | ICD-10-CM | POA: Diagnosis not present

## 2015-01-11 DIAGNOSIS — M1A9XX0 Chronic gout, unspecified, without tophus (tophi): Secondary | ICD-10-CM | POA: Diagnosis not present

## 2015-01-11 LAB — COMPREHENSIVE METABOLIC PANEL
ALBUMIN: 4 g/dL (ref 3.5–5.2)
ALK PHOS: 77 U/L (ref 39–117)
ALT: 11 U/L (ref 0–35)
AST: 16 U/L (ref 0–37)
BILIRUBIN TOTAL: 0.8 mg/dL (ref 0.2–1.2)
BUN: 17 mg/dL (ref 6–23)
CALCIUM: 10.2 mg/dL (ref 8.4–10.5)
CO2: 31 mEq/L (ref 19–32)
Chloride: 104 mEq/L (ref 96–112)
Creatinine, Ser: 0.77 mg/dL (ref 0.40–1.20)
GFR: 95.47 mL/min (ref >60.00)
Glucose, Bld: 109 mg/dL — ABNORMAL HIGH (ref 70–99)
Potassium: 3.8 mEq/L (ref 3.5–5.1)
Sodium: 141 mEq/L (ref 135–145)
TOTAL PROTEIN: 7.2 g/dL (ref 6.0–8.3)

## 2015-01-11 LAB — LIPID PANEL
CHOL/HDL RATIO: 3
CHOLESTEROL: 219 mg/dL — AB (ref 0–200)
HDL: 64 mg/dL (ref >39.00)
LDL Cholesterol: 138 mg/dL — ABNORMAL HIGH (ref 0–99)
NonHDL: 155.27
TRIGLYCERIDES: 88 mg/dL (ref 0.0–149.0)
VLDL: 17.6 mg/dL (ref 0.0–40.0)

## 2015-01-11 LAB — URIC ACID: URIC ACID, SERUM: 6 mg/dL (ref 2.4–7.0)

## 2015-01-11 LAB — VITAMIN D 25 HYDROXY (VIT D DEFICIENCY, FRACTURES): VITD: 23.82 ng/mL — AB (ref 30.00–100.00)

## 2015-01-11 LAB — HEMOGLOBIN A1C: Hgb A1c MFr Bld: 6 % (ref 4.6–6.5)

## 2015-01-12 ENCOUNTER — Telehealth: Payer: Self-pay | Admitting: Family Medicine

## 2015-01-12 LAB — HEPATITIS C ANTIBODY: HCV Ab: NEGATIVE

## 2015-01-12 NOTE — Telephone Encounter (Signed)
Pt returned phone call that she received this morning to call Dr. Rometta Emery office.

## 2015-01-12 NOTE — Telephone Encounter (Signed)
See result note.  

## 2015-01-19 ENCOUNTER — Encounter: Payer: Medicare Other | Admitting: Family Medicine

## 2015-01-19 DIAGNOSIS — G5601 Carpal tunnel syndrome, right upper limb: Secondary | ICD-10-CM | POA: Diagnosis not present

## 2015-01-21 HISTORY — PX: BREAST BIOPSY: SHX20

## 2015-02-19 ENCOUNTER — Encounter: Payer: Self-pay | Admitting: Family Medicine

## 2015-02-19 ENCOUNTER — Ambulatory Visit (INDEPENDENT_AMBULATORY_CARE_PROVIDER_SITE_OTHER): Payer: Commercial Managed Care - HMO | Admitting: Family Medicine

## 2015-02-19 VITALS — BP 104/64 | HR 86 | Temp 98.6°F | Ht 64.0 in | Wt 199.8 lb

## 2015-02-19 DIAGNOSIS — E119 Type 2 diabetes mellitus without complications: Secondary | ICD-10-CM

## 2015-02-19 DIAGNOSIS — Z0000 Encounter for general adult medical examination without abnormal findings: Secondary | ICD-10-CM | POA: Diagnosis not present

## 2015-02-19 DIAGNOSIS — E559 Vitamin D deficiency, unspecified: Secondary | ICD-10-CM

## 2015-02-19 DIAGNOSIS — E785 Hyperlipidemia, unspecified: Secondary | ICD-10-CM

## 2015-02-19 DIAGNOSIS — I10 Essential (primary) hypertension: Secondary | ICD-10-CM

## 2015-02-19 DIAGNOSIS — Z7189 Other specified counseling: Secondary | ICD-10-CM | POA: Diagnosis not present

## 2015-02-19 LAB — HM DIABETES FOOT EXAM

## 2015-02-19 NOTE — Progress Notes (Signed)
HPI  I have personally reviewed the Medicare Annual Wellness questionnaire and have noted 1. The patient's medical and social history 2. Their use of alcohol, tobacco or illicit drugs 3. Their current medications and supplements 4. The patient's functional ability including ADL's, fall risks, home safety risks and hearing or visual             impairment. 5. Diet and physical activities 6. Evidence for depression or mood disorders 7.         Updated provider list Cognitive evaluation was performed and recorded on pt medicare questionnaire form. The patients weight, height, BMI and visual acuity have been recorded in the chart  I have made referrals, counseling and provided education to the patient based review of the above and I have provided the pt with a written personalized care plan for preventive services.   Hypertension:  Good control on amlodipine and lisinopril BP Readings from Last 3 Encounters:  02/19/15 104/64  07/11/14 154/92  01/06/14 170/97  Using medication without problems or lightheadedness: None  Chest pain with exertion:None  Edema:occ, better with support hose  Short of breath:None  Average home [id number] daily  110/79 Other issues:   Diabetes: Well controlled with diet.  Lab Results  Component Value Date   HGBA1C 6.0 01/11/2015  Hypoglycemic episodes:?  Hyperglycemic episodes:?  Feet problems:none  Blood Sugars averaging:not checking  eye exam within last year: yes  Elevated Cholesterol: LDL not at goal <100 . She feels that red yeast rice caused itching. Has stopped. She refuses other med at this time but if not at goal in 6 months will consider. Lab Results  Component Value Date   CHOL 219* 01/11/2015   HDL 64.00 01/11/2015   LDLCALC 138* 01/11/2015   LDLDIRECT 132.7 12/07/2012   TRIG 88.0 01/11/2015   CHOLHDL 3 01/11/2015  Diet compliance: Good.  Decreasing meats, increasing veggies. Exercise: walking some. Will start water  aerobics next week. 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 Plans 06/2014.   Has left carpal tunnel surgery 02/2014.   Reports that she has never smoked. She has never used smokeless tobacco. She reports that she does not drink alcohol or use illicit drugs.  Social History /Family History/Past Medical History reviewed and updated if needed.   Review of Systems  Constitutional: Negative for fever and fatigue.  HENT: Negative for ear pain.  Eyes: Negative for pain.  Respiratory: Negative for chest tightness and shortness of breath.  Cardiovascular: Negative for chest pain, palpitations and leg swelling.  Gastrointestinal: Negative for abdominal pain.  Genitourinary: Negative for dysuria.  Objective:   Physical Exam  Constitutional: Vital signs are normal. She appears well-developed and well-nourished. She is cooperative. Non-toxic appearance. She does not appear ill. No distress.  HENT:  Head: Normocephalic.  Right Ear: Hearing, tympanic membrane, external ear and ear canal normal.  Left Ear: Hearing, tympanic membrane, external ear and ear canal normal.  Nose: Nose normal.  Eyes: Conjunctivae normal, EOM and lids are normal. Pupils are equal, round, and reactive to light. No foreign bodies found.  Neck: Trachea normal and normal range of motion. Neck supple. Carotid bruit is not present. No mass and no thyromegaly present.  Cardiovascular: Normal rate, regular  rhythm, S1 normal, S2 normal, normal heart sounds and intact distal pulses. Exam reveals no gallop.  No murmur heard.  Pulmonary/Chest: Effort normal and breath sounds normal. No respiratory distress. She has no wheezes. She has no rhonchi. She has no rales.  Abdominal: Soft. Normal  appearance and bowel sounds are normal. She exhibits no distension, no fluid wave, no abdominal bruit and no mass. There is no hepatosplenomegaly. There is no tenderness. There is no rebound, no guarding and no CVA tenderness. No hernia.  Genitourinary: No breast swelling, tenderness, discharge or bleeding.  Lymphadenopathy:  She has no cervical adenopathy.  She has no axillary adenopathy.  Neurological: She is alert. She has normal strength. No cranial nerve deficit or sensory deficit.  Skin: Skin is warm, dry and intact. No rash noted.  Psychiatric: Her speech is normal and behavior is normal. Judgment normal. Her mood appears not anxious. Cognition and memory are normal. She does not exhibit a depressed mood.   Diabetic foot exam:  Normal inspection  No skin breakdown  No calluses  Normal DP pulses  Normal sensation to light touch and monofilament  Nails normal   Assessment & Plan:   The patient's preventative maintenance and recommended screening tests for an annual wellness exam were reviewed in full today.  Brought up to date unless services declined.  Counselled on the importance of diet, exercise, and its role in overall health and mortality.  The patient's FH and SH was reviewed, including their home life, tobacco status, and drug and alcohol status.   Vaccines: refused/allergic flu, uptodate with PCV 23.  Due for TDap PCV13 and shingles Colon: 3 polyps 12/2013 Dr. Watt Climes. Repeat in 5 years. Mammogram: Last nml in 05/2014. DXA: osteopenia 03/2011, t-1.9 on vit D,  repeat in 5 years Pap/DVE: not indicated total hysterectomy for non cancer indication. Non smoker Hep C: done

## 2015-02-19 NOTE — Progress Notes (Signed)
Pre visit review using our clinic review tool, if applicable. No additional management support is needed unless otherwise documented below in the visit note. 

## 2015-02-19 NOTE — Assessment & Plan Note (Signed)
Well controlled. Continue current medication.  

## 2015-02-19 NOTE — Assessment & Plan Note (Signed)
Poor control on no med. Pt wishes to make aggressive lifestlye change.. recehck in 6 month, if not at goal will start medication.

## 2015-02-19 NOTE — Patient Instructions (Addendum)
Work on low cholesterol diet. Increase exercise and weight loss. Restart vit D supplement.  Look into coverage of shingles and TDAP vaccines.  Return for pneumonia vaccine PCV13  (prevnar) when you can.

## 2015-02-19 NOTE — Assessment & Plan Note (Signed)
Replete

## 2015-02-19 NOTE — Addendum Note (Signed)
Addended by: Eliezer Lofts E on: 02/19/2015 11:08 AM   Modules accepted: Orders, SmartSet

## 2015-02-19 NOTE — Assessment & Plan Note (Signed)
Has living will, HCPOA:  Shareka Sarkissian, husband, full code ( reviewed 2017)

## 2015-02-19 NOTE — Assessment & Plan Note (Signed)
Well controlled with diet. 

## 2015-02-20 ENCOUNTER — Encounter: Payer: Self-pay | Admitting: Family Medicine

## 2015-02-20 NOTE — Progress Notes (Signed)
Order(s) created erroneously. Erroneous order ID: [id number]  Order moved by: Alfonso Patten  Order move date/time: 02/20/2015 11:26 AM  Source Patient: [id number]  Source Contact: 02/20/2015  Destination Patient: [id number]  Destination Contact: 04/06/2012

## 2015-02-20 NOTE — Progress Notes (Signed)
Order(s) created erroneously. Erroneous order ID: [id number]  Order moved by: Alfonso Patten  Order move date/time: 02/20/2015 11:24 AM  Source Patient: [id number]  Source Contact: 01/06/2014  Destination Patient: [id number]  Destination Contact: 04/06/2012

## 2015-02-27 ENCOUNTER — Encounter: Payer: Self-pay | Admitting: Family Medicine

## 2015-03-01 ENCOUNTER — Other Ambulatory Visit: Payer: Self-pay | Admitting: *Deleted

## 2015-03-01 MED ORDER — TRUEPLUS LANCETS 33G MISC: Status: DC

## 2015-03-01 MED ORDER — AMLODIPINE BESYLATE 10 MG PO TABS: 10.0000 mg | ORAL_TABLET | Freq: Every day | ORAL | Status: DC

## 2015-03-01 MED ORDER — LISINOPRIL 40 MG PO TABS: 40.0000 mg | ORAL_TABLET | Freq: Every day | ORAL | Status: DC

## 2015-03-01 MED ORDER — TRUE METRIX AIR GLUCOSE METER DEVI: 1.0000 | Freq: Every day | Status: AC

## 2015-03-01 MED ORDER — TRUE METRIX LEVEL 1 LOW VI SOLN: Status: DC

## 2015-03-01 MED ORDER — BD SWAB SINGLE USE REGULAR PADS: MEDICATED_PAD | Status: AC

## 2015-03-01 MED ORDER — GLUCOSE BLOOD VI STRP: ORAL_STRIP | Status: DC

## 2015-03-01 MED ORDER — FUROSEMIDE 20 MG PO TABS: ORAL_TABLET | ORAL | Status: DC

## 2015-03-01 MED ORDER — ATORVASTATIN CALCIUM 20 MG PO TABS: 20.0000 mg | ORAL_TABLET | Freq: Every day | ORAL | Status: DC

## 2015-03-06 DIAGNOSIS — E1142 Type 2 diabetes mellitus with diabetic polyneuropathy: Secondary | ICD-10-CM | POA: Diagnosis not present

## 2015-03-06 DIAGNOSIS — B351 Tinea unguium: Secondary | ICD-10-CM | POA: Diagnosis not present

## 2015-03-13 DIAGNOSIS — G5602 Carpal tunnel syndrome, left upper limb: Secondary | ICD-10-CM | POA: Diagnosis not present

## 2015-03-18 ENCOUNTER — Other Ambulatory Visit: Payer: Self-pay | Admitting: Family Medicine

## 2015-03-21 DIAGNOSIS — Z4789 Encounter for other orthopedic aftercare: Secondary | ICD-10-CM | POA: Diagnosis not present

## 2015-03-27 DIAGNOSIS — G5602 Carpal tunnel syndrome, left upper limb: Secondary | ICD-10-CM | POA: Diagnosis not present

## 2015-03-31 ENCOUNTER — Other Ambulatory Visit: Payer: Self-pay | Admitting: Family Medicine

## 2015-04-02 NOTE — Telephone Encounter (Signed)
Routing to PCP

## 2015-05-25 ENCOUNTER — Telehealth: Payer: Self-pay | Admitting: Family Medicine

## 2015-05-25 DIAGNOSIS — M79676 Pain in unspecified toe(s): Secondary | ICD-10-CM

## 2015-05-25 NOTE — Telephone Encounter (Signed)
Will forward to Dr. Diona Browner to order referral.

## 2015-05-25 NOTE — Telephone Encounter (Signed)
Melanie Lang came in saying she's had her toenail removed six times and it causes her a lot of pain. She's been going to Dr. Elvina Mattes in [place] for six years and is having another "flare up" with her toe. She'd like to see Dr. Tana Felts, DPM, [place], at [facility] in [place] instead. She already has an appointment being held for her at that location on May 26th, 2017 at 10:45am. They're phone number is: [phone] and the fax number is: [phone]. Please give her a call regarding this.  Pt's ph# [phone] Thank you.

## 2015-06-08 ENCOUNTER — Other Ambulatory Visit: Payer: Self-pay | Admitting: Family Medicine

## 2015-06-08 NOTE — Telephone Encounter (Signed)
Call pt.. Is she having a flare? If so okay to refill, just make sure she holds simvastatin while she is on colcrys.

## 2015-06-08 NOTE — Telephone Encounter (Signed)
Patient returned Donna's call.  Call patient back before 4:00 at her home number.  After 4:00, call her back on her cell phone [phone].

## 2015-06-08 NOTE — Telephone Encounter (Signed)
Last office visit 02/19/2015.  Not on current medication list.  Refill?

## 2015-06-08 NOTE — Telephone Encounter (Signed)
Spoke with Ms. Zima.  She states she is having a gout flare.  Refill for colcrys sent to her pharmacy.  Patient advised not to take her Simvastatin while taking the colcrys.  Patient states understanding.

## 2015-06-08 NOTE — Telephone Encounter (Signed)
Left message for Melanie Lang to return my call.

## 2015-06-11 ENCOUNTER — Other Ambulatory Visit: Payer: Self-pay

## 2015-06-11 DIAGNOSIS — Z1231 Encounter for screening mammogram for malignant neoplasm of breast: Secondary | ICD-10-CM

## 2015-06-12 ENCOUNTER — Encounter: Payer: Self-pay | Admitting: Family Medicine

## 2015-06-12 ENCOUNTER — Ambulatory Visit (INDEPENDENT_AMBULATORY_CARE_PROVIDER_SITE_OTHER): Payer: Commercial Managed Care - HMO | Admitting: Family Medicine

## 2015-06-12 VITALS — BP 140/76 | HR 78 | Temp 98.4°F | Ht 64.0 in | Wt 198.5 lb

## 2015-06-12 DIAGNOSIS — E785 Hyperlipidemia, unspecified: Secondary | ICD-10-CM

## 2015-06-12 DIAGNOSIS — Z01810 Encounter for preprocedural cardiovascular examination: Secondary | ICD-10-CM | POA: Insufficient documentation

## 2015-06-12 DIAGNOSIS — I10 Essential (primary) hypertension: Secondary | ICD-10-CM | POA: Diagnosis not present

## 2015-06-12 DIAGNOSIS — E119 Type 2 diabetes mellitus without complications: Secondary | ICD-10-CM

## 2015-06-12 LAB — LIPID PANEL
CHOLESTEROL: 212 mg/dL — AB (ref 0–200)
HDL: 59.9 mg/dL (ref >39.00)
LDL CALC: 130 mg/dL — AB (ref 0–99)
NonHDL: 152.21
Total CHOL/HDL Ratio: 4
Triglycerides: 109 mg/dL (ref 0.0–149.0)
VLDL: 21.8 mg/dL (ref 0.0–40.0)

## 2015-06-12 LAB — COMPREHENSIVE METABOLIC PANEL
ALT: 13 U/L (ref 0–35)
AST: 16 U/L (ref 0–37)
Albumin: 4.2 g/dL (ref 3.5–5.2)
Alkaline Phosphatase: 68 U/L (ref 39–117)
BUN: 14 mg/dL (ref 6–23)
CALCIUM: 10.2 mg/dL (ref 8.4–10.5)
CHLORIDE: 101 meq/L (ref 96–112)
CO2: 30 meq/L (ref 19–32)
CREATININE: 0.71 mg/dL (ref 0.40–1.20)
GFR: 104.72 mL/min (ref >60.00)
Glucose, Bld: 99 mg/dL (ref 70–99)
Potassium: 3.8 mEq/L (ref 3.5–5.1)
SODIUM: 138 meq/L (ref 135–145)
Total Bilirubin: 0.6 mg/dL (ref 0.2–1.2)
Total Protein: 7.5 g/dL (ref 6.0–8.3)

## 2015-06-12 LAB — CBC WITH DIFFERENTIAL/PLATELET
BASOS PCT: 0.6 % (ref 0.0–3.0)
Basophils Absolute: 0.1 10*3/uL (ref 0.0–0.1)
EOS ABS: 0.1 10*3/uL (ref 0.0–0.7)
Eosinophils Relative: 1.5 % (ref 0.0–5.0)
HEMATOCRIT: 40.3 % (ref 36.0–46.0)
HEMOGLOBIN: 13.3 g/dL (ref 12.0–15.0)
Lymphocytes Relative: 26.9 % (ref 12.0–46.0)
Lymphs Abs: 2.3 10*3/uL (ref 0.7–4.0)
MCHC: 33 g/dL (ref 30.0–36.0)
MCV: 84.4 fl (ref 78.0–100.0)
Monocytes Absolute: 0.6 10*3/uL (ref 0.1–1.0)
Monocytes Relative: 7.4 % (ref 3.0–12.0)
Neutro Abs: 5.5 10*3/uL (ref 1.4–7.7)
Neutrophils Relative %: 63.6 % (ref 43.0–77.0)
Platelets: 319 10*3/uL (ref 150.0–400.0)
RBC: 4.77 Mil/uL (ref 3.87–5.11)
RDW: 13.8 % (ref 11.5–15.5)
WBC: 8.7 10*3/uL (ref 4.0–10.5)

## 2015-06-12 LAB — HEMOGLOBIN A1C: Hgb A1c MFr Bld: 6.2 % (ref 4.6–6.5)

## 2015-06-12 LAB — HM DIABETES FOOT EXAM

## 2015-06-12 NOTE — Patient Instructions (Addendum)
Stop at lab on way out.  We will send in pre-op clearance sheet or let you know otherwise once lab return.

## 2015-06-12 NOTE — Assessment & Plan Note (Signed)
Due for re-eval on statin. Encouraged exercise, weight loss, healthy eating habits.   

## 2015-06-12 NOTE — Assessment & Plan Note (Addendum)
Moderate risk given DM and high cholesterol and age. EKG:  Nonspecific changes. Eval with labs. As long as normal, she can proceed with surgery.

## 2015-06-12 NOTE — Progress Notes (Signed)
Subjective:    Patient ID: Melanie Lang, female    DOB: 1945/12/28, 70 y.o.   MRN: [id number]  HPI  70 year old female with history of asthma,mild intermittent, DM well controlled previously,well controlled  HTN presents for pre-op eval prior to left total knee revision by Dr. Wynelle Link on 07/04/2015.  She is doing well overall. More recent surgery left carpal tunnel in 04/2015, no sedation.  No past complications, no sedation issues in past.  She is due for re-eval of DM and cholesterol although that was previously well controlled.  No current CP, shortness of breath. No need for albuterol inhaler.  No fever, no cold symptoms.  Hypertension:   Well controlled on lisinopril, amlodipine and lasix as needed.  Using medication without problems  lightheadedness: None Chest pain with exertion:None Edema:occ, uses lasix 3 times a week Short of breath: None Average home BPs: Following BP at home .. running 129/70 BP Readings from Last 3 Encounters:  06/12/15 140/76  02/19/15 104/64  07/11/14 154/92    Diabetes:  previously well controlled with diet. Using medications without difficulties: Hypoglycemic episodes:? Hyperglycemic episodes:? Feet problems: no ulcer Blood Sugars averaging: ? eye exam within last year: due in 5/30 Diet: low  Carb Exercise: limited because of knee.  Lab Results  Component Value Date   HGBA1C 6.0 01/11/2015    Due for chol check on atorvastatin.  Social History /Family History/Past Medical History reviewed and updated if needed.  Review of Systems  Constitutional: Negative for fever and fatigue.  HENT: Negative for ear pain.   Eyes: Negative for pain.  Respiratory: Negative for chest tightness and shortness of breath.   Cardiovascular: Positive for leg swelling. Negative for chest pain and palpitations.  Gastrointestinal: Negative for abdominal pain.  Genitourinary: Negative for dysuria.       Objective:   Physical Exam  Constitutional:  Vital signs are normal. She appears well-developed and well-nourished. She is cooperative.  Non-toxic appearance. She does not appear ill. No distress.  HENT:  Head: Normocephalic.  Right Ear: Hearing, tympanic membrane, external ear and ear canal normal. Tympanic membrane is not erythematous, not retracted and not bulging.  Left Ear: Hearing, tympanic membrane, external ear and ear canal normal. Tympanic membrane is not erythematous, not retracted and not bulging.  Nose: No mucosal edema or rhinorrhea. Right sinus exhibits no maxillary sinus tenderness and no frontal sinus tenderness. Left sinus exhibits no maxillary sinus tenderness and no frontal sinus tenderness.  Mouth/Throat: Uvula is midline, oropharynx is clear and moist and mucous membranes are normal.  Eyes: Conjunctivae, EOM and lids are normal. Pupils are equal, round, and reactive to light. Lids are everted and swept, no foreign bodies found.  Neck: Trachea normal and normal range of motion. Neck supple. Carotid bruit is not present. No thyroid mass and no thyromegaly present.  Cardiovascular: Normal rate, regular rhythm, S1 normal, S2 normal, normal heart sounds, intact distal pulses and normal pulses.  Exam reveals no gallop and no friction rub.   No murmur heard. Pulmonary/Chest: Effort normal and breath sounds normal. No tachypnea. No respiratory distress. She has no decreased breath sounds. She has no wheezes. She has no rhonchi. She has no rales.  Abdominal: Soft. Normal appearance and bowel sounds are normal. There is no tenderness.  Neurological: She is alert.  Skin: Skin is warm, dry and intact. No rash noted.  Psychiatric: Her speech is normal and behavior is normal. Judgment and thought content normal. Her mood appears not  anxious. Cognition and memory are normal. She does not exhibit a depressed mood.     Diabetic foot exam: Normal inspection No skin breakdown No calluses  Normal DP pulses Normal sensation to light  touch and monofilament Nails normal      Assessment & Plan:

## 2015-06-12 NOTE — Progress Notes (Signed)
Pre visit review using our clinic review tool, if applicable. No additional management support is needed unless otherwise documented below in the visit note. 

## 2015-06-12 NOTE — Assessment & Plan Note (Signed)
Due for re-eval. Previously well controlled with diet.

## 2015-06-12 NOTE — Assessment & Plan Note (Signed)
Well controlled. Continue current medication.  

## 2015-06-15 ENCOUNTER — Ambulatory Visit: Payer: Commercial Managed Care - HMO | Admitting: Podiatry

## 2015-06-19 DIAGNOSIS — Z471 Aftercare following joint replacement surgery: Secondary | ICD-10-CM | POA: Diagnosis not present

## 2015-06-19 DIAGNOSIS — T849XXA Unspecified complication of internal orthopedic prosthetic device, implant and graft, initial encounter: Secondary | ICD-10-CM | POA: Diagnosis not present

## 2015-06-19 DIAGNOSIS — Z96652 Presence of left artificial knee joint: Secondary | ICD-10-CM | POA: Diagnosis not present

## 2015-06-22 ENCOUNTER — Ambulatory Visit: Payer: Self-pay | Admitting: Orthopedic Surgery

## 2015-06-22 NOTE — Progress Notes (Signed)
Preoperative surgical orders have been place into the Epic hospital system for Melanie Lang on 06/22/2015, 9:25 AM  by Mickel Crow for surgery on 07-04-15.  Preop Total Knee orders including Experal, IV Tylenol, and IV Decadron as long as there are no contraindications to the above medications. Arlee Muslim, PA-C

## 2015-06-23 ENCOUNTER — Ambulatory Visit: Payer: Self-pay | Admitting: Orthopedic Surgery

## 2015-06-25 NOTE — Progress Notes (Signed)
DR Diona Browner - clearance on chart LOV- 06/12/2015- EPIC  EKG- 05/29/2015- EPIC  HGBA1C- 06/12/15- EPIC

## 2015-06-25 NOTE — Patient Instructions (Signed)
Melanie Lang  06/25/2015   Your procedure is scheduled on: 07/04/2015    Report to [facility]  Entrance take [place]  elevators to 3rd floor to  [place] at    [place] AM.  Call this number if you have problems the morning of surgery [phone]   Remember: ONLY 1 PERSON MAY GO WITH YOU TO SHORT STAY TO GET  READY MORNING OF [place].  Do not eat food or drink liquids :After Midnight.     Take these medicines the morning of surgery with A SIP OF WATER: Amlodipine ( Norvasc) DO NOT TAKE ANY DIABETIC MEDICATIONS DAY OF YOUR SURGERY                               You may not have any metal on your body including hair pins and              piercings  Do not wear jewelry, make-up, lotions, powders or perfumes, deodorant             Do not wear nail polish.  Do not shave  48 hours prior to surgery.            Do not bring valuables to the hospital. [place].  Contacts, dentures or bridgework may not be worn into surgery.  Leave suitcase in the car. After surgery it may be brought to your room.        Special Instructions: coughing and deep breathing exercises, leg exercises               Please read over the following fact sheets you were given: _____________________________________________________________________             [facility] - Preparing for Surgery Before surgery, you can play an important role.  Because skin is not sterile, your skin needs to be as free of germs as possible.  You can reduce the number of germs on your skin by washing with CHG (chlorahexidine gluconate) soap before surgery.  CHG is an antiseptic cleaner which kills germs and bonds with the skin to continue killing germs even after washing. Please DO NOT use if you have an allergy to CHG or antibacterial soaps.  If your skin becomes reddened/irritated stop using the CHG and inform your nurse when you arrive at Short  Stay. Do not shave (including legs and underarms) for at least 48 hours prior to the first CHG shower.  You may shave your face/neck. Please follow these instructions carefully:  1.  Shower with CHG Soap the night before surgery and the  morning of Surgery.  2.  If you choose to wash your hair, wash your hair first as usual with your  normal  shampoo.  3.  After you shampoo, rinse your hair and body thoroughly to remove the  shampoo.                           4.  Use CHG as you would any other liquid soap.  You can apply chg directly  to the skin and wash  Gently with a scrungie or clean washcloth.  5.  Apply the CHG Soap to your body ONLY FROM THE NECK DOWN.   Do not use on face/ open                           Wound or open sores. Avoid contact with eyes, ears mouth and genitals (private parts).                       Wash face,  Genitals (private parts) with your normal soap.             6.  Wash thoroughly, paying special attention to the area where your surgery  will be performed.  7.  Thoroughly rinse your body with warm water from the neck down.  8.  DO NOT shower/wash with your normal soap after using and rinsing off  the CHG Soap.                9.  Pat yourself dry with a clean towel.            10.  Wear clean pajamas.            11.  Place clean sheets on your bed the night of your first shower and do not  sleep with pets. Day of Surgery : Do not apply any lotions/deodorants the morning of surgery.  Please wear clean clothes to the hospital/surgery center.  FAILURE TO FOLLOW THESE INSTRUCTIONS MAY RESULT IN THE CANCELLATION OF YOUR SURGERY PATIENT SIGNATURE_________________________________  NURSE SIGNATURE__________________________________  ________________________________________________________________________  WHAT IS A BLOOD TRANSFUSION? Blood Transfusion Information  A transfusion is the replacement of blood or some of its parts. Blood is made up of  multiple cells which provide different functions.  Red blood cells carry oxygen and are used for blood loss replacement.  White blood cells fight against infection.  Platelets control bleeding.  Plasma helps clot blood.  Other blood products are available for specialized needs, such as hemophilia or other clotting disorders. BEFORE THE TRANSFUSION  Who gives blood for transfusions?   Healthy volunteers who are fully evaluated to make sure their blood is safe. This is blood bank blood. Transfusion therapy is the safest it has ever been in the practice of medicine. Before blood is taken from a donor, a complete history is taken to make sure that person has no history of diseases nor engages in risky social behavior (examples are intravenous drug use or sexual activity with multiple partners). The donor's travel history is screened to minimize risk of transmitting infections, such as malaria. The donated blood is tested for signs of infectious diseases, such as HIV and hepatitis. The blood is then tested to be sure it is compatible with you in order to minimize the chance of a transfusion reaction. If you or a relative donates blood, this is often done in anticipation of surgery and is not appropriate for emergency situations. It takes many days to process the donated blood. RISKS AND COMPLICATIONS Although transfusion therapy is very safe and saves many lives, the main dangers of transfusion include:  1. Getting an infectious disease. 2. Developing a transfusion reaction. This is an allergic reaction to something in the blood you were given. Every precaution is taken to prevent this. The decision to have a blood transfusion has been considered carefully by your caregiver before blood is given. Blood is not given unless the benefits outweigh  the risks. AFTER THE TRANSFUSION  Right after receiving a blood transfusion, you will usually feel much better and more energetic. This is especially true if  your red blood cells have gotten low (anemic). The transfusion raises the level of the red blood cells which carry oxygen, and this usually causes an energy increase.  The nurse administering the transfusion will monitor you carefully for complications. HOME CARE INSTRUCTIONS  No special instructions are needed after a transfusion. You may find your energy is better. Speak with your caregiver about any limitations on activity for underlying diseases you may have. SEEK MEDICAL CARE IF:   Your condition is not improving after your transfusion.  You develop redness or irritation at the intravenous (IV) site. SEEK IMMEDIATE MEDICAL CARE IF:  Any of the following symptoms occur over the next 12 hours:  Shaking chills.  You have a temperature by mouth above 102 F (38.9 C), not controlled by medicine.  Chest, back, or muscle pain.  People around you feel you are not acting correctly or are confused.  Shortness of breath or difficulty breathing.  Dizziness and fainting.  You get a rash or develop hives.  You have a decrease in urine output.  Your urine turns a dark color or changes to pink, red, or brown. Any of the following symptoms occur over the next 10 days:  You have a temperature by mouth above 102 F (38.9 C), not controlled by medicine.  Shortness of breath.  Weakness after normal activity.  The white part of the eye turns yellow (jaundice).  You have a decrease in the amount of urine or are urinating less often.  Your urine turns a dark color or changes to pink, red, or brown. Document Released: 01/04/2000 Document Revised: 03/31/2011 Document Reviewed: 08/23/2007 ExitCare Patient Information 2014 [place].  _______________________________________________________________________  Incentive Spirometer  An incentive spirometer is a tool that can help keep your lungs clear and active. This tool measures how well you are filling your lungs with each breath.  Taking long deep breaths may help reverse or decrease the chance of developing breathing (pulmonary) problems (especially infection) following:  A long period of time when you are unable to move or be active. BEFORE THE PROCEDURE   If the spirometer includes an indicator to show your best effort, your nurse or respiratory therapist will set it to a desired goal.  If possible, sit up straight or lean slightly forward. Try not to slouch.  Hold the incentive spirometer in an upright position. INSTRUCTIONS FOR USE  3. Sit on the edge of your bed if possible, or sit up as far as you can in bed or on a chair. 4. Hold the incentive spirometer in an upright position. 5. Breathe out normally. 6. Place the mouthpiece in your mouth and seal your lips tightly around it. 7. Breathe in slowly and as deeply as possible, raising the piston or the ball toward the top of the column. 8. Hold your breath for 3-5 seconds or for as long as possible. Allow the piston or ball to fall to the bottom of the column. 9. Remove the mouthpiece from your mouth and breathe out normally. 10. Rest for a few seconds and repeat Steps 1 through 7 at least 10 times every 1-2 hours when you are awake. Take your time and take a few normal breaths between deep breaths. 11. The spirometer may include an indicator to show your best effort. Use the indicator as a goal to work  toward during each repetition. 12. After each set of 10 deep breaths, practice coughing to be sure your lungs are clear. If you have an incision (the cut made at the time of surgery), support your incision when coughing by placing a pillow or rolled up towels firmly against it. Once you are able to get out of bed, walk around indoors and cough well. You may stop using the incentive spirometer when instructed by your caregiver.  RISKS AND COMPLICATIONS  Take your time so you do not get dizzy or light-headed.  If you are in pain, you may need to take or ask for  pain medication before doing incentive spirometry. It is harder to take a deep breath if you are having pain. AFTER USE  Rest and breathe slowly and easily.  It can be helpful to keep track of a log of your progress. Your caregiver can provide you with a simple table to help with this. If you are using the spirometer at home, follow these instructions: [place] IF:   You are having difficultly using the spirometer.  You have trouble using the spirometer as often as instructed.  Your pain medication is not giving enough relief while using the spirometer.  You develop fever of 100.5 F (38.1 C) or higher. SEEK IMMEDIATE MEDICAL CARE IF:   You cough up bloody sputum that had not been present before.  You develop fever of 102 F (38.9 C) or greater.  You develop worsening pain at or near the incision site. MAKE SURE YOU:   Understand these instructions.  Will watch your condition.  Will get help right away if you are not doing well or get worse. Document Released: 05/19/2006 Document Revised: 03/31/2011 Document Reviewed: 07/20/2006 [facility] Patient Information 2014 [place], [place].   ________________________________________________________________________

## 2015-06-26 ENCOUNTER — Encounter (HOSPITAL_COMMUNITY)
Admission: RE | Admit: 2015-06-26 | Discharge: 2015-06-26 | Disposition: A | Discharge status: Home / Self Care | Payer: Commercial Managed Care - HMO | Source: Ambulatory Visit | Attending: Orthopedic Surgery | Admitting: Orthopedic Surgery

## 2015-06-26 ENCOUNTER — Encounter (HOSPITAL_COMMUNITY): Payer: Self-pay

## 2015-06-26 DIAGNOSIS — Y838 Other surgical procedures as the cause of abnormal reaction of the patient, or of later complication, without mention of misadventure at the time of the procedure: Secondary | ICD-10-CM | POA: Insufficient documentation

## 2015-06-26 DIAGNOSIS — T84033A Mechanical loosening of internal left knee prosthetic joint, initial encounter: Secondary | ICD-10-CM | POA: Diagnosis not present

## 2015-06-26 DIAGNOSIS — Z0183 Encounter for blood typing: Secondary | ICD-10-CM | POA: Insufficient documentation

## 2015-06-26 DIAGNOSIS — Z01812 Encounter for preprocedural laboratory examination: Secondary | ICD-10-CM | POA: Diagnosis not present

## 2015-06-26 HISTORY — DX: Gastro-esophageal reflux disease without esophagitis: K21.9

## 2015-06-26 HISTORY — DX: Type 2 diabetes mellitus without complications: E11.9

## 2015-06-26 LAB — URINALYSIS, ROUTINE W REFLEX MICROSCOPIC
Bilirubin Urine: NEGATIVE
Glucose, UA: NEGATIVE mg/dL
HGB URINE DIPSTICK: NEGATIVE
KETONES UR: NEGATIVE mg/dL
LEUKOCYTES UA: NEGATIVE
Nitrite: NEGATIVE
PROTEIN: NEGATIVE mg/dL
Specific Gravity, Urine: 1.012 (ref 1.005–1.030)
pH: 6.5 (ref 5.0–8.0)

## 2015-06-26 LAB — COMPREHENSIVE METABOLIC PANEL
ALT: 17 U/L (ref 14–54)
AST: 23 U/L (ref 15–41)
Albumin: 4 g/dL (ref 3.5–5.0)
Alkaline Phosphatase: 70 U/L (ref 38–126)
Anion gap: 8 (ref 5–15)
BUN: 14 mg/dL (ref 6–20)
CHLORIDE: 105 mmol/L (ref 101–111)
CO2: 27 mmol/L (ref 22–32)
Calcium: 9.4 mg/dL (ref 8.9–10.3)
Creatinine, Ser: 0.65 mg/dL (ref 0.44–1.00)
GFR calc Af Amer: >60 mL/min (ref >60)
Glucose, Bld: 109 mg/dL — ABNORMAL HIGH (ref 65–99)
POTASSIUM: 3.5 mmol/L (ref 3.5–5.1)
Sodium: 140 mmol/L (ref 135–145)
Total Bilirubin: 0.8 mg/dL (ref 0.3–1.2)
Total Protein: 7.6 g/dL (ref 6.5–8.1)

## 2015-06-26 LAB — CBC
HCT: 40.3 % (ref 36.0–46.0)
Hemoglobin: 13 g/dL (ref 12.0–15.0)
MCH: 27.7 pg (ref 26.0–34.0)
MCHC: 32.3 g/dL (ref 30.0–36.0)
MCV: 85.7 fL (ref 78.0–100.0)
PLATELETS: 310 10*3/uL (ref 150–400)
RBC: 4.7 MIL/uL (ref 3.87–5.11)
RDW: 13.6 % (ref 11.5–15.5)
WBC: 8.6 10*3/uL (ref 4.0–10.5)

## 2015-06-26 LAB — PROTIME-INR
INR: 1.01 (ref 0.00–1.49)
PROTHROMBIN TIME: 13.1 s (ref 11.6–15.2)

## 2015-06-26 LAB — ABO/RH: ABO/RH(D): A POS

## 2015-06-26 LAB — SURGICAL PCR SCREEN
MRSA, PCR: NEGATIVE
Staphylococcus aureus: NEGATIVE

## 2015-06-26 LAB — APTT: APTT: 32 s (ref 24–37)

## 2015-06-27 ENCOUNTER — Ambulatory Visit
Admission: RE | Admit: 2015-06-27 | Discharge: 2015-06-27 | Disposition: A | Discharge status: Home / Self Care | Payer: Commercial Managed Care - HMO | Source: Ambulatory Visit

## 2015-06-27 DIAGNOSIS — Z1231 Encounter for screening mammogram for malignant neoplasm of breast: Secondary | ICD-10-CM | POA: Diagnosis not present

## 2015-07-01 ENCOUNTER — Ambulatory Visit: Payer: Self-pay | Admitting: Orthopedic Surgery

## 2015-07-01 NOTE — H&P (Signed)
Melanie Lang DOB: September 26, 1945 Married / Language: English / Race: Black or African American Female Date of Admission:  07/04/2015 CC:  Left Knee pain History of Present Illness The patient is a 70 year old female who comes in for a preoperative History and Physical. The patient is scheduled for a left total knee arthroplasty (revision) to be performed by [name]. [name], MD at [facility] on 07-04-2015. The patient was seen in referral from [name] (and second opinion evaluation). The patient reports left knee symptoms which began 4 year(s) (7 months) ago without any known injury (left total knee by Dr.[name]). The patient describes the severity of the symptoms as varies. The patient describes their pain as sharp and aching.The patient feels that the symptoms are unchanging. Prior to being seen today the patient was previously evaluated by Dr.[name]) ago. Previous work-up for this problem has included knee x-rays and orthopedic consultation (Dr.[name]). Past treatment for this problem has included physical therapy (and arch supports). Symptoms are reported to be located in the left medial knee (mainly) and include knee pain, instability and difficulty ambulating (abnormal gait, knee wants to give way). The patient does not report any radiation of symptoms. Onset of symptoms was gradual with symptoms now occurring constantly. The patient is not currently being treated for this problem. Note for "Knee pain": Also had right total knee by Dr.[name] 5 years ago which is doing well. Occaional discomfort. Does wear compression stockings all the time due to varicose veins. The left knee has been problematic for her since the start. She has noticed that the alignment has been off with the leg in valgus. She feels this very unstable. She also has pain. Did not have any issues of fever, chills, swelling or wound drainage with the initial surgery. Unfortunately,  she is 5 years out and things are getting progressively worse. AP and lateral of both knees showed the prosthesis on the right in good alignment with no periprosthetic abnormalities. On the left, both the femur and tibia are in valgus and the overall alignment is about 10 degrees of valgus. No evidence of any definitive loosening. At this point, the most predictable means of improving pain and function is revision total knee arthroplasty. The procedure, risks, potential complications and rehab course are discussed in detail and the patient elects to proceed. Unfortunately, she is having a lot of pain and dysfunction with this valgus abnormality. There is also instability. At this point, I feel the only mechanism for improvement would be to revise the knee. We did discuss this in detail including procedure, risks, potential, complications, rehab course and she would like to proceed. They have been treated conservatively in the past for the above stated problem and despite conservative measures, they continue to have progressive pain and severe functional limitations and dysfunction. They have failed non-operative management including home exercise, medications. It is felt that they would benefit from undergoing revision of total joint replacement. Risks and benefits of the procedure have been discussed with the patient and they elect to proceed with surgery. There are no active contraindications to surgery such as ongoing infection or rapidly progressive neurological disease.   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)  Surgery: Right CTR 12/19/14 Diet-Controlled Diabetes Mellitus  Allergies No Known Drug Allergies  Family History Congestive Heart Failure   father Diabetes Mellitus  grandfather mothers side Severe allergy  mother and father  Social History Alcohol use  never consumed alcohol Current work status  disabled Drug/Alcohol Rehab (Currently)  no Drug/Alcohol Rehab (Previously)  no Exercise  Exercises weekly; does running / walking Illicit drug use  no Living situation  live with spouse Marital status  married Number of flights of stairs before winded  4-5 Pain Contract  no Tobacco / smoke exposure  no Tobacco use  never smoker  Medication History  AmLODIPine Besylate (Oral) Specific strength unknown - Active. Furosemide (Oral) Specific strength unknown - Active. Lisinopril (Oral) Specific strength unknown - Active. Colchicine (0.6MG  Tablet, Oral) Active.  Past Surgical History Breast Biopsy  right Total Knee Replacement  bilateral Carpal Tunnel Surgery - Both   Review of Systems  General Not Present- Chills, Fatigue, Fever, Memory Loss, Night Sweats, Weight Gain and Weight Loss. Skin Not Present- Eczema, Hives, Itching, Lesions and Rash. HEENT Not Present- Dentures, Double Vision, Headache, Hearing Loss, Tinnitus and Visual Loss. Respiratory Not Present- Allergies, Chronic Cough, Coughing up blood, Shortness of breath at rest and Shortness of breath with exertion. Cardiovascular Not Present- Chest Pain, Difficulty Breathing Lying Down, Murmur, Palpitations, Racing/skipping heartbeats and Swelling. Gastrointestinal Not Present- Abdominal Pain, Bloody Stool, Constipation, Diarrhea, Difficulty Swallowing, Heartburn, Jaundice, Loss of appetitie, Nausea and Vomiting. Female Genitourinary Not Present- Blood in Urine, Discharge, Flank Pain, Incontinence, Painful Urination, Urgency, Urinary frequency, Urinary Retention, Urinating at Night and Weak urinary stream. Musculoskeletal Present- Joint Pain and Joint Swelling. Not Present- Back Pain, Morning Stiffness, Muscle Pain, Muscle Weakness and  Spasms. Neurological Not Present- Blackout spells, Difficulty with balance, Dizziness, Paralysis, Tremor and Weakness. Psychiatric Not Present- Insomnia.  Vitals Weight: 189 lb Height: 65in Body Surface Area: 1.93 m Body Mass Index: 31.45 kg/m  Pulse: 76 (Regular)  BP: 162/92 (Sitting, Right Arm, Standard) Physical Exam General Mental Status -Alert, cooperative and good historian. General Appearance-pleasant, Not in acute distress. Orientation-Oriented X3. Build & Nutrition-Well nourished and Well developed.  Head and Neck Head-normocephalic, atraumatic . Neck Global Assessment - supple, no bruit auscultated on the right, no bruit auscultated on the left. Note: upper and lower dentures   Eye Vision-Wears corrective lenses. Pupil - Bilateral-Regular and Round. Motion - Bilateral-EOMI.  Chest and Lung Exam Auscultation Breath sounds - clear at anterior chest wall and clear at posterior chest wall. Adventitious sounds - No Adventitious sounds.  Cardiovascular Auscultation Rhythm - Regular rate and rhythm. Heart Sounds - S1 WNL and S2 WNL. Murmurs & Other Heart Sounds - Auscultation of the heart reveals - No Murmurs.  Abdomen Palpation/Percussion Tenderness - Abdomen is non-tender to palpation. Rigidity (guarding) - Abdomen is soft. Auscultation Auscultation of the abdomen reveals - Bowel sounds normal.  Female Genitourinary Note: Not done, not pertinent to present illness   Musculoskeletal Note: She is alert and oriented, no apparent distress. Evaluation of her hips shows normal motion, no discomfort. Her right knee looks fine. Range of motion of right knee is about 0 to 115 with no tenderness or instability. Left knee shows moderate valgus alignment. Range is about 0 to 95. There is a fair amount of varus and valgus, and AP instability noted about the left knee. Her gait pattern is antalgic on the left.  RADIOGRAPHS AP and lateral of both  knees showed the prosthesis on the right in good alignment with no periprosthetic abnormalities. On the left, both  the femur and tibia are in valgus and the overall alignment is about 10 degrees of valgus. No evidence of any definitive loosening.  Assessment & Plan Complication of internal left knee prosthesis, unspecified complication, initial encounter (T84.9XXA, [id number])  Note:Surgical Plans: Revisoin Left Total Knee  Disposition: Home  PCP: Dr. Nile Dear - Patient has been seen preoperatively and felt to be stable for surgery.  IV TXA  Anesthesia Issues: None  Signed electronically by Ok Edwards, III PA-C

## 2015-07-02 ENCOUNTER — Other Ambulatory Visit: Payer: Self-pay | Admitting: Family Medicine

## 2015-07-02 DIAGNOSIS — R928 Other abnormal and inconclusive findings on diagnostic imaging of breast: Secondary | ICD-10-CM

## 2015-07-03 NOTE — Anesthesia Preprocedure Evaluation (Addendum)
Anesthesia Evaluation  Patient identified by MRN, date of birth, ID band Patient awake    Reviewed: Allergy & Precautions, NPO status , Patient's Chart, lab work & pertinent test results  Airway Mallampati: II  TM Distance: >3 FB Neck ROM: Full    Dental   Pulmonary asthma ,    breath sounds clear to auscultation       Cardiovascular hypertension, Pt. on medications  Rhythm:Regular Rate:Normal     Neuro/Psych negative neurological ROS     GI/Hepatic Neg liver ROS, GERD  ,  Endo/Other  diabetes, Type 2  Renal/GU negative Renal ROS     Musculoskeletal  (+) Arthritis ,   Abdominal   Peds  Hematology negative hematology ROS (+)   Anesthesia Other Findings   Reproductive/Obstetrics                            Lab Results  Component Value Date   WBC 8.6 06/26/2015   HGB 13.0 06/26/2015   HCT 40.3 06/26/2015   MCV 85.7 06/26/2015   PLT 310 06/26/2015   Lab Results  Component Value Date   CREATININE 0.65 06/26/2015   BUN 14 06/26/2015   NA 140 06/26/2015   K 3.5 06/26/2015   CL 105 06/26/2015   CO2 27 06/26/2015   Lab Results  Component Value Date   INR 1.01 06/26/2015   INR 1.85* 04/15/2010   INR 1.55* 04/14/2010    Anesthesia Physical Anesthesia Plan  ASA: II  Anesthesia Plan: Spinal and MAC   Post-op Pain Management:    Induction: Intravenous  Airway Management Planned: Natural Airway and Simple Face Mask  Additional Equipment:   Intra-op Plan:   Post-operative Plan:   Informed Consent: I have reviewed the patients History and Physical, chart, labs and discussed the procedure including the risks, benefits and alternatives for the proposed anesthesia with the patient or authorized representative who has indicated his/her understanding and acceptance.     Plan Discussed with: CRNA  Anesthesia Plan Comments:        Anesthesia Quick Evaluation

## 2015-07-04 ENCOUNTER — Encounter (HOSPITAL_COMMUNITY)
Admission: RE | Disposition: A | Discharge status: Home Health | Payer: Self-pay | Source: Ambulatory Visit | Attending: Orthopedic Surgery

## 2015-07-04 ENCOUNTER — Encounter (HOSPITAL_COMMUNITY): Payer: Self-pay | Admitting: *Deleted

## 2015-07-04 ENCOUNTER — Inpatient Hospital Stay (HOSPITAL_COMMUNITY): Payer: Commercial Managed Care - HMO | Admitting: Anesthesiology

## 2015-07-04 ENCOUNTER — Inpatient Hospital Stay (HOSPITAL_COMMUNITY)
Admission: RE | Admit: 2015-07-04 | Discharge: 2015-07-06 | DRG: 468 | Disposition: A | Discharge status: Home Health | Payer: Commercial Managed Care - HMO | Source: Ambulatory Visit | Attending: Orthopedic Surgery | Admitting: Orthopedic Surgery

## 2015-07-04 DIAGNOSIS — T84033A Mechanical loosening of internal left knee prosthetic joint, initial encounter: Secondary | ICD-10-CM | POA: Diagnosis present

## 2015-07-04 DIAGNOSIS — M21062 Valgus deformity, not elsewhere classified, left knee: Secondary | ICD-10-CM | POA: Diagnosis not present

## 2015-07-04 DIAGNOSIS — T8489XA Other specified complication of internal orthopedic prosthetic devices, implants and grafts, initial encounter: Secondary | ICD-10-CM | POA: Diagnosis not present

## 2015-07-04 DIAGNOSIS — I8390 Asymptomatic varicose veins of unspecified lower extremity: Secondary | ICD-10-CM | POA: Diagnosis not present

## 2015-07-04 DIAGNOSIS — T84093A Other mechanical complication of internal left knee prosthesis, initial encounter: Secondary | ICD-10-CM | POA: Diagnosis not present

## 2015-07-04 DIAGNOSIS — J45909 Unspecified asthma, uncomplicated: Secondary | ICD-10-CM | POA: Diagnosis not present

## 2015-07-04 DIAGNOSIS — Z96651 Presence of right artificial knee joint: Secondary | ICD-10-CM | POA: Diagnosis present

## 2015-07-04 DIAGNOSIS — Z96652 Presence of left artificial knee joint: Secondary | ICD-10-CM

## 2015-07-04 DIAGNOSIS — M179 Osteoarthritis of knee, unspecified: Secondary | ICD-10-CM | POA: Diagnosis present

## 2015-07-04 DIAGNOSIS — M1710 Unilateral primary osteoarthritis, unspecified knee: Secondary | ICD-10-CM | POA: Diagnosis present

## 2015-07-04 DIAGNOSIS — I10 Essential (primary) hypertension: Secondary | ICD-10-CM | POA: Diagnosis present

## 2015-07-04 DIAGNOSIS — T8484XA Pain due to internal orthopedic prosthetic devices, implants and grafts, initial encounter: Secondary | ICD-10-CM | POA: Diagnosis not present

## 2015-07-04 DIAGNOSIS — E119 Type 2 diabetes mellitus without complications: Secondary | ICD-10-CM | POA: Diagnosis not present

## 2015-07-04 DIAGNOSIS — M150 Primary generalized (osteo)arthritis: Secondary | ICD-10-CM | POA: Diagnosis not present

## 2015-07-04 DIAGNOSIS — M1990 Unspecified osteoarthritis, unspecified site: Secondary | ICD-10-CM | POA: Diagnosis not present

## 2015-07-04 DIAGNOSIS — Y831 Surgical operation with implant of artificial internal device as the cause of abnormal reaction of the patient, or of later complication, without mention of misadventure at the time of the procedure: Secondary | ICD-10-CM | POA: Diagnosis present

## 2015-07-04 DIAGNOSIS — Z96659 Presence of unspecified artificial knee joint: Secondary | ICD-10-CM | POA: Diagnosis not present

## 2015-07-04 HISTORY — PX: TOTAL KNEE REVISION: SHX996

## 2015-07-04 LAB — TYPE AND SCREEN
ABO/RH(D): A POS
Antibody Screen: NEGATIVE

## 2015-07-04 LAB — GLUCOSE, CAPILLARY
GLUCOSE-CAPILLARY: 125 mg/dL — AB (ref 65–99)
GLUCOSE-CAPILLARY: 113 mg/dL — AB (ref 65–99)
GLUCOSE-CAPILLARY: 141 mg/dL — AB (ref 65–99)
GLUCOSE-CAPILLARY: 155 mg/dL — AB (ref 65–99)
Glucose-Capillary: 130 mg/dL — ABNORMAL HIGH (ref 65–99)

## 2015-07-04 SURGERY — TOTAL KNEE REVISION
Anesthesia: Monitor Anesthesia Care | Site: Knee | Laterality: Left

## 2015-07-04 MED ORDER — PHENOL 1.4 % MT LIQD
1.0000 | OROMUCOSAL | Status: DC | PRN
  Filled 2015-07-04: qty 177

## 2015-07-04 MED ORDER — PHENYLEPHRINE 40 MCG/ML (10ML) SYRINGE FOR IV PUSH (FOR BLOOD PRESSURE SUPPORT)
PREFILLED_SYRINGE | INTRAVENOUS | Status: AC
  Filled 2015-07-04: qty 10

## 2015-07-04 MED ORDER — MENTHOL 3 MG MT LOZG: 1.0000 | LOZENGE | OROMUCOSAL | Status: DC | PRN

## 2015-07-04 MED ORDER — ACETAMINOPHEN 650 MG RE SUPP: 650.0000 mg | Freq: Four times a day (QID) | RECTAL | Status: DC | PRN

## 2015-07-04 MED ORDER — PROMETHAZINE HCL 25 MG/ML IJ SOLN: 6.2500 mg | INTRAMUSCULAR | Status: DC | PRN

## 2015-07-04 MED ORDER — ATROPINE SULFATE 0.4 MG/ML IJ SOLN
INTRAMUSCULAR | Status: AC
  Filled 2015-07-04: qty 1

## 2015-07-04 MED ORDER — TRAMADOL HCL 50 MG PO TABS: 50.0000 mg | ORAL_TABLET | Freq: Four times a day (QID) | ORAL | Status: DC | PRN

## 2015-07-04 MED ORDER — DOCUSATE SODIUM 100 MG PO CAPS
100.0000 mg | ORAL_CAPSULE | Freq: Two times a day (BID) | ORAL | Status: DC
  Administered 2015-07-04 – 2015-07-06 (×4): 100 mg via ORAL
  Filled 2015-07-04 (×4): qty 1

## 2015-07-04 MED ORDER — COLCHICINE 0.6 MG PO TABS
0.6000 mg | ORAL_TABLET | Freq: Two times a day (BID) | ORAL | Status: DC | PRN
  Filled 2015-07-04: qty 1

## 2015-07-04 MED ORDER — ACETAMINOPHEN 500 MG PO TABS
1000.0000 mg | ORAL_TABLET | Freq: Four times a day (QID) | ORAL | Status: AC
  Administered 2015-07-04 – 2015-07-05 (×4): 1000 mg via ORAL
  Filled 2015-07-04 (×4): qty 2

## 2015-07-04 MED ORDER — BUPIVACAINE IN DEXTROSE 0.75-8.25 % IT SOLN
INTRATHECAL | Status: DC | PRN
  Administered 2015-07-04: 2 mL via INTRATHECAL

## 2015-07-04 MED ORDER — BUPIVACAINE HCL 0.25 % IJ SOLN
INTRAMUSCULAR | Status: DC | PRN
  Administered 2015-07-04: 20 mL

## 2015-07-04 MED ORDER — METHOCARBAMOL 500 MG PO TABS: 500.0000 mg | ORAL_TABLET | Freq: Four times a day (QID) | ORAL | Status: DC | PRN

## 2015-07-04 MED ORDER — FUROSEMIDE 20 MG PO TABS: 20.0000 mg | ORAL_TABLET | Freq: Every day | ORAL | Status: DC | PRN

## 2015-07-04 MED ORDER — SODIUM CHLORIDE 0.9 % IR SOLN
Status: DC | PRN
  Administered 2015-07-04: 1000 mL

## 2015-07-04 MED ORDER — HYDROMORPHONE HCL 1 MG/ML IJ SOLN
0.2500 mg | INTRAMUSCULAR | Status: DC | PRN
Start: 2015-07-04 — End: 2015-07-04

## 2015-07-04 MED ORDER — CHLORHEXIDINE GLUCONATE 4 % EX LIQD: 60.0000 mL | Freq: Once | CUTANEOUS | Status: DC

## 2015-07-04 MED ORDER — METOCLOPRAMIDE HCL 5 MG/ML IJ SOLN: 5.0000 mg | Freq: Three times a day (TID) | INTRAMUSCULAR | Status: DC | PRN

## 2015-07-04 MED ORDER — SODIUM CHLORIDE 0.9 % IV SOLN
INTRAVENOUS | Status: DC
  Administered 2015-07-04: 14:00:00 via INTRAVENOUS

## 2015-07-04 MED ORDER — BUPIVACAINE HCL (PF) 0.25 % IJ SOLN
INTRAMUSCULAR | Status: AC
Start: 2015-07-04 — End: 2015-07-04
  Filled 2015-07-04: qty 30

## 2015-07-04 MED ORDER — DEXAMETHASONE SODIUM PHOSPHATE 10 MG/ML IJ SOLN
10.0000 mg | Freq: Once | INTRAMUSCULAR | Status: AC
  Administered 2015-07-05: 10 mg via INTRAVENOUS
  Filled 2015-07-04: qty 1

## 2015-07-04 MED ORDER — ACETAMINOPHEN 10 MG/ML IV SOLN
1000.0000 mg | Freq: Once | INTRAVENOUS | Status: AC
  Administered 2015-07-04: 1000 mg via INTRAVENOUS

## 2015-07-04 MED ORDER — CEFAZOLIN SODIUM-DEXTROSE 2-4 GM/100ML-% IV SOLN
2.0000 g | Freq: Four times a day (QID) | INTRAVENOUS | Status: AC
  Administered 2015-07-04 (×2): 2 g via INTRAVENOUS
  Filled 2015-07-04 (×2): qty 100

## 2015-07-04 MED ORDER — OXYCODONE HCL 5 MG PO TABS
5.0000 mg | ORAL_TABLET | ORAL | Status: DC | PRN
  Administered 2015-07-04 (×2): 5 mg via ORAL
  Administered 2015-07-04 – 2015-07-06 (×10): 10 mg via ORAL
  Filled 2015-07-04 (×4): qty 2
  Filled 2015-07-04: qty 1
  Filled 2015-07-04: qty 2
  Filled 2015-07-04: qty 1
  Filled 2015-07-04 (×5): qty 2

## 2015-07-04 MED ORDER — OXYCODONE HCL 5 MG PO TABS: 5.0000 mg | ORAL_TABLET | ORAL | Status: DC | PRN

## 2015-07-04 MED ORDER — FLEET ENEMA 7-19 GM/118ML RE ENEM: 1.0000 | ENEMA | Freq: Once | RECTAL | Status: DC | PRN

## 2015-07-04 MED ORDER — OXYCODONE HCL 5 MG/5ML PO SOLN: 5.0000 mg | Freq: Once | ORAL | Status: DC | PRN

## 2015-07-04 MED ORDER — CEFAZOLIN SODIUM-DEXTROSE 2-4 GM/100ML-% IV SOLN
2.0000 g | INTRAVENOUS | Status: AC
  Administered 2015-07-04: 2 g via INTRAVENOUS

## 2015-07-04 MED ORDER — DEXAMETHASONE SODIUM PHOSPHATE 10 MG/ML IJ SOLN
10.0000 mg | Freq: Once | INTRAMUSCULAR | Status: AC
Start: 2015-07-04 — End: 2015-07-04
  Administered 2015-07-04: 10 mg via INTRAVENOUS

## 2015-07-04 MED ORDER — PROPOFOL 10 MG/ML IV BOLUS
INTRAVENOUS | Status: AC
  Filled 2015-07-04: qty 60

## 2015-07-04 MED ORDER — BUPIVACAINE LIPOSOME 1.3 % IJ SUSP
20.0000 mL | Freq: Once | INTRAMUSCULAR | Status: DC
  Filled 2015-07-04: qty 20

## 2015-07-04 MED ORDER — ACETAMINOPHEN 325 MG PO TABS
650.0000 mg | ORAL_TABLET | Freq: Four times a day (QID) | ORAL | Status: DC | PRN
Start: 2015-07-05 — End: 2015-07-06

## 2015-07-04 MED ORDER — PHENYLEPHRINE 40 MCG/ML (10ML) SYRINGE FOR IV PUSH (FOR BLOOD PRESSURE SUPPORT)
PREFILLED_SYRINGE | INTRAVENOUS | Status: AC
Start: 2015-07-04 — End: 2015-07-04
  Filled 2015-07-04: qty 10

## 2015-07-04 MED ORDER — ONDANSETRON HCL 4 MG/2ML IJ SOLN
INTRAMUSCULAR | Status: DC | PRN
  Administered 2015-07-04: 4 mg via INTRAVENOUS

## 2015-07-04 MED ORDER — FENTANYL CITRATE (PF) 100 MCG/2ML IJ SOLN
INTRAMUSCULAR | Status: AC
Start: 2015-07-04 — End: 2015-07-04
  Filled 2015-07-04: qty 2

## 2015-07-04 MED ORDER — MIDAZOLAM HCL 5 MG/5ML IJ SOLN
INTRAMUSCULAR | Status: DC | PRN
  Administered 2015-07-04: 2 mg via INTRAVENOUS

## 2015-07-04 MED ORDER — BUPIVACAINE LIPOSOME 1.3 % IJ SUSP
INTRAMUSCULAR | Status: DC | PRN
  Administered 2015-07-04: 20 mL

## 2015-07-04 MED ORDER — FENTANYL CITRATE (PF) 100 MCG/2ML IJ SOLN
INTRAMUSCULAR | Status: DC | PRN
  Administered 2015-07-04 (×2): 50 ug via INTRAVENOUS

## 2015-07-04 MED ORDER — POLYETHYLENE GLYCOL 3350 17 G PO PACK: 17.0000 g | PACK | Freq: Every day | ORAL | Status: DC | PRN

## 2015-07-04 MED ORDER — METOCLOPRAMIDE HCL 5 MG PO TABS: 5.0000 mg | ORAL_TABLET | Freq: Three times a day (TID) | ORAL | Status: DC | PRN

## 2015-07-04 MED ORDER — PROPOFOL 500 MG/50ML IV EMUL
INTRAVENOUS | Status: DC | PRN
  Administered 2015-07-04: 50 ug/kg/min via INTRAVENOUS

## 2015-07-04 MED ORDER — CEFAZOLIN SODIUM-DEXTROSE 2-4 GM/100ML-% IV SOLN
INTRAVENOUS | Status: AC
  Filled 2015-07-04: qty 100

## 2015-07-04 MED ORDER — LACTATED RINGERS IV SOLN
INTRAVENOUS | Status: DC
  Administered 2015-07-04: 09:00:00 via INTRAVENOUS
  Administered 2015-07-04: 1000 mL via INTRAVENOUS
  Administered 2015-07-04: 10:00:00 via INTRAVENOUS

## 2015-07-04 MED ORDER — TRANEXAMIC ACID 1000 MG/10ML IV SOLN
1000.0000 mg | INTRAVENOUS | Status: AC
  Administered 2015-07-04: 1000 mg via INTRAVENOUS
  Filled 2015-07-04: qty 10

## 2015-07-04 MED ORDER — PHENYLEPHRINE HCL 10 MG/ML IJ SOLN
INTRAMUSCULAR | Status: DC | PRN
  Administered 2015-07-04: 40 ug via INTRAVENOUS
  Administered 2015-07-04 (×3): 80 ug via INTRAVENOUS
  Administered 2015-07-04 (×2): 40 ug via INTRAVENOUS
  Administered 2015-07-04: 80 ug via INTRAVENOUS
  Administered 2015-07-04 (×2): 40 ug via INTRAVENOUS

## 2015-07-04 MED ORDER — EPHEDRINE SULFATE 50 MG/ML IJ SOLN
INTRAMUSCULAR | Status: AC
  Filled 2015-07-04: qty 1

## 2015-07-04 MED ORDER — SODIUM CHLORIDE 0.9 % IJ SOLN
INTRAMUSCULAR | Status: DC | PRN
  Administered 2015-07-04: 30 mL

## 2015-07-04 MED ORDER — DIPHENHYDRAMINE HCL 12.5 MG/5ML PO ELIX: 12.5000 mg | ORAL_SOLUTION | ORAL | Status: DC | PRN

## 2015-07-04 MED ORDER — SODIUM CHLORIDE 0.9 % IJ SOLN
INTRAMUSCULAR | Status: AC
  Filled 2015-07-04: qty 10

## 2015-07-04 MED ORDER — ACETAMINOPHEN 10 MG/ML IV SOLN
INTRAVENOUS | Status: AC
  Filled 2015-07-04: qty 100

## 2015-07-04 MED ORDER — MORPHINE SULFATE (PF) 2 MG/ML IV SOLN: 1.0000 mg | INTRAVENOUS | Status: DC | PRN

## 2015-07-04 MED ORDER — PROPOFOL 10 MG/ML IV BOLUS
INTRAVENOUS | Status: DC | PRN
  Administered 2015-07-04: 10 mg via INTRAVENOUS

## 2015-07-04 MED ORDER — ONDANSETRON HCL 4 MG/2ML IJ SOLN: 4.0000 mg | Freq: Four times a day (QID) | INTRAMUSCULAR | Status: DC | PRN

## 2015-07-04 MED ORDER — DEXAMETHASONE SODIUM PHOSPHATE 10 MG/ML IJ SOLN
INTRAMUSCULAR | Status: AC
  Filled 2015-07-04: qty 1

## 2015-07-04 MED ORDER — NAPHAZOLINE-PHENIRAMINE 0.027-0.315 % OP SOLN: Freq: Every day | OPHTHALMIC | Status: DC | PRN

## 2015-07-04 MED ORDER — PROPOFOL 10 MG/ML IV BOLUS
INTRAVENOUS | Status: AC
  Filled 2015-07-04: qty 20

## 2015-07-04 MED ORDER — TRANEXAMIC ACID 1000 MG/10ML IV SOLN
1000.0000 mg | Freq: Once | INTRAVENOUS | Status: AC
  Administered 2015-07-04: 1000 mg via INTRAVENOUS
  Filled 2015-07-04: qty 10

## 2015-07-04 MED ORDER — SODIUM CHLORIDE 0.9 % IJ SOLN
INTRAMUSCULAR | Status: AC
  Filled 2015-07-04: qty 50

## 2015-07-04 MED ORDER — INSULIN ASPART 100 UNIT/ML ~~LOC~~ SOLN
0.0000 [IU] | Freq: Three times a day (TID) | SUBCUTANEOUS | Status: DC
  Administered 2015-07-05 (×2): 3 [IU] via SUBCUTANEOUS

## 2015-07-04 MED ORDER — ONDANSETRON HCL 4 MG PO TABS: 4.0000 mg | ORAL_TABLET | Freq: Four times a day (QID) | ORAL | Status: DC | PRN

## 2015-07-04 MED ORDER — OXYCODONE HCL 5 MG PO TABS: 5.0000 mg | ORAL_TABLET | Freq: Once | ORAL | Status: DC | PRN

## 2015-07-04 MED ORDER — RIVAROXABAN 10 MG PO TABS
10.0000 mg | ORAL_TABLET | Freq: Every day | ORAL | Status: DC
  Administered 2015-07-05 – 2015-07-06 (×2): 10 mg via ORAL
  Filled 2015-07-04 (×2): qty 1

## 2015-07-04 MED ORDER — RIVAROXABAN 10 MG PO TABS: 10.0000 mg | ORAL_TABLET | Freq: Every day | ORAL | Status: DC

## 2015-07-04 MED ORDER — DEXTROSE 5 % IV SOLN
500.0000 mg | Freq: Four times a day (QID) | INTRAVENOUS | Status: DC | PRN
  Administered 2015-07-04: 500 mg via INTRAVENOUS
  Filled 2015-07-04: qty 5
  Filled 2015-07-04: qty 550

## 2015-07-04 MED ORDER — BISACODYL 10 MG RE SUPP: 10.0000 mg | Freq: Every day | RECTAL | Status: DC | PRN

## 2015-07-04 MED ORDER — METHOCARBAMOL 500 MG PO TABS
500.0000 mg | ORAL_TABLET | Freq: Four times a day (QID) | ORAL | Status: DC | PRN
  Administered 2015-07-04 – 2015-07-06 (×3): 500 mg via ORAL
  Filled 2015-07-04 (×3): qty 1

## 2015-07-04 MED ORDER — ONDANSETRON HCL 4 MG/2ML IJ SOLN
INTRAMUSCULAR | Status: AC
Start: 2015-07-04 — End: 2015-07-04
  Filled 2015-07-04: qty 2

## 2015-07-04 MED ORDER — AMLODIPINE BESYLATE 10 MG PO TABS
10.0000 mg | ORAL_TABLET | Freq: Every day | ORAL | Status: DC
  Administered 2015-07-05 – 2015-07-06 (×2): 10 mg via ORAL
  Filled 2015-07-04 (×2): qty 1

## 2015-07-04 MED ORDER — MIDAZOLAM HCL 2 MG/2ML IJ SOLN
INTRAMUSCULAR | Status: AC
  Filled 2015-07-04: qty 2

## 2015-07-04 SURGICAL SUPPLY — 64 items
ADAPTER BOLT FEMORAL +2/-2 (Knees) ×3 IMPLANT
AUGMENT DIST PFC 4MM (Knees) ×1 IMPLANT
BAG DECANTER FOR FLEXI CONT (MISCELLANEOUS) ×3 IMPLANT
BAG ZIPLOCK 12X15 (MISCELLANEOUS) IMPLANT
BANDAGE ACE 6X5 VEL STRL LF (GAUZE/BANDAGES/DRESSINGS) IMPLANT
BANDAGE ELASTIC 6 VELCRO ST LF (GAUZE/BANDAGES/DRESSINGS) ×3 IMPLANT
BLADE SAG 18X100X1.27 (BLADE) ×3 IMPLANT
BLADE SAW SGTL 11.0X1.19X90.0M (BLADE) ×3 IMPLANT
BONE CEMENT GENTAMICIN (Cement) ×9 IMPLANT
CEMENT BONE GENTAMICIN 40 (Cement) ×3 IMPLANT
CEMENT RESTRICTOR DEPUY SZ 4 (Cement) ×3 IMPLANT
CLOSURE WOUND 1/2 X4 (GAUZE/BANDAGES/DRESSINGS) ×1
CLOTH BEACON ORANGE TIMEOUT ST (SAFETY) ×3 IMPLANT
CUFF TOURN SGL QUICK 34 (TOURNIQUET CUFF) ×2
CUFF TRNQT CYL 34X4X40X1 (TOURNIQUET CUFF) ×1 IMPLANT
DISAL AUG PFC 4MM (Knees) ×3 IMPLANT
DRAPE U-SHAPE 47X51 STRL (DRAPES) ×3 IMPLANT
DRSG ADAPTIC 3X8 NADH LF (GAUZE/BANDAGES/DRESSINGS) ×3 IMPLANT
DRSG PAD ABDOMINAL 8X10 ST (GAUZE/BANDAGES/DRESSINGS) IMPLANT
DURAPREP 26ML APPLICATOR (WOUND CARE) ×3 IMPLANT
ELECT REM PT RETURN 9FT ADLT (ELECTROSURGICAL) ×3
ELECTRODE REM PT RTRN 9FT ADLT (ELECTROSURGICAL) ×1 IMPLANT
EVACUATOR 1/8 PVC DRAIN (DRAIN) ×3 IMPLANT
FEMORAL ADAPTER (Orthopedic Implant) ×3 IMPLANT
FEMORAL PFC TC3 (Orthopedic Implant) ×3 IMPLANT
GAUZE SPONGE 4X4 12PLY STRL (GAUZE/BANDAGES/DRESSINGS) ×3 IMPLANT
GLOVE BIO SURGEON STRL SZ7.5 (GLOVE) IMPLANT
GLOVE BIO SURGEON STRL SZ8 (GLOVE) ×3 IMPLANT
GLOVE BIOGEL PI IND STRL 8 (GLOVE) ×1 IMPLANT
GLOVE BIOGEL PI INDICATOR 8 (GLOVE) ×2
GLOVE SURG SS PI 6.5 STRL IVOR (GLOVE) IMPLANT
GOWN STRL REUS W/TWL LRG LVL3 (GOWN DISPOSABLE) ×3 IMPLANT
GOWN STRL REUS W/TWL XL LVL3 (GOWN DISPOSABLE) IMPLANT
HANDPIECE INTERPULSE COAX TIP (DISPOSABLE) ×2
IMMOBILIZER KNEE 20 (SOFTGOODS) ×6 IMPLANT
IMMOBILIZER KNEE 20 THIGH 36 (SOFTGOODS) ×1 IMPLANT
INSERT TIB LCS RP LRG 12.5 (Knees) ×3 IMPLANT
INSERT TIBIAL TC3 RP SZ 2.5 (Knees) ×3 IMPLANT
MANIFOLD NEPTUNE II (INSTRUMENTS) ×3 IMPLANT
NS IRRIG 1000ML POUR BTL (IV SOLUTION) ×3 IMPLANT
PACK TOTAL KNEE CUSTOM (KITS) ×3 IMPLANT
PAD ABD 8X10 STRL (GAUZE/BANDAGES/DRESSINGS) ×3 IMPLANT
PADDING CAST COTTON 6X4 STRL (CAST SUPPLIES) ×3 IMPLANT
POSITIONER SURGICAL ARM (MISCELLANEOUS) ×3 IMPLANT
POST AUG PFC 4MM SZ 2.5 (Knees) ×6 IMPLANT
SET HNDPC FAN SPRY TIP SCT (DISPOSABLE) ×1 IMPLANT
STEM TIBIA PFC 13X30MM (Stem) ×3 IMPLANT
STEM UNIVERSAL REVISION 75X16 (Stem) ×3 IMPLANT
STRIP CLOSURE SKIN 1/2X4 (GAUZE/BANDAGES/DRESSINGS) ×2 IMPLANT
SUT VIC AB 2-0 CT1 27 (SUTURE) ×6
SUT VIC AB 2-0 CT1 TAPERPNT 27 (SUTURE) ×3 IMPLANT
SUT VLOC 180 0 24IN GS25 (SUTURE) ×3 IMPLANT
SWAB COLLECTION DEVICE MRSA (MISCELLANEOUS) IMPLANT
SWAB CULTURE ESWAB REG 1ML (MISCELLANEOUS) IMPLANT
SYR 50ML LL SCALE MARK (SYRINGE) ×3 IMPLANT
TOWER CARTRIDGE SMART MIX (DISPOSABLE) ×3 IMPLANT
TRAY FOLEY W/METER SILVER 14FR (SET/KITS/TRAYS/PACK) ×3 IMPLANT
TRAY FOLEY W/METER SILVER 16FR (SET/KITS/TRAYS/PACK) IMPLANT
TRAY REVISION SZ 2.5 (Knees) ×3 IMPLANT
TRAY SLEEVE CEM ML (Knees) ×3 IMPLANT
TUBE KAMVAC SUCTION (TUBING) IMPLANT
WATER STERILE IRR 1500ML POUR (IV SOLUTION) ×3 IMPLANT
WEDGE STEP SZ.5 5MM (Knees) ×6 IMPLANT
WRAP KNEE MAXI GEL POST OP (GAUZE/BANDAGES/DRESSINGS) ×3 IMPLANT

## 2015-07-04 NOTE — Transfer of Care (Signed)
Immediate Anesthesia Transfer of Care Note  Patient: Melanie Lang  Procedure(s) Performed: Procedure(s): LEFT TOTAL KNEE REVISION (Left)  Patient Location: PACU  Anesthesia Type:Spinal  Level of Consciousness:  sedated, patient cooperative and responds to stimulation  Airway & Oxygen Therapy:Patient Spontanous Breathing and Patient connected to face mask oxgen  Post-op Assessment:  Report given to PACU RN and Post -op Vital signs reviewed and stable  Post vital signs:  Reviewed and stable, spinal L1  Last Vitals:  Filed Vitals:   07/04/15 0620  BP: 159/88  Pulse: 100  Temp: 36.9 C  Resp: 18    Complications: No apparent anesthesia complications

## 2015-07-04 NOTE — Anesthesia Procedure Notes (Signed)
Spinal Patient location during procedure: OR Start time: 07/04/2015 8:54 AM End time: 07/04/2015 8:54 AM Staffing Anesthesiologist: Suzette Battiest Resident/CRNA: Darlys Gales R Performed by: resident/CRNA  Preanesthetic Checklist Completed: patient identified, site marked, surgical consent, pre-op evaluation, timeout performed, IV checked, risks and benefits discussed and monitors and equipment checked Spinal Block Patient position: sitting Prep: ChloraPrep Patient monitoring: heart rate, continuous pulse ox and blood pressure Approach: midline Location: L3-4 Injection technique: single-shot Needle Needle type: Spinocan  Needle gauge: 22 G Needle length: 9 cm Needle insertion depth: 7.5 cm Assessment Sensory level: T6

## 2015-07-04 NOTE — Interval H&P Note (Signed)
History and Physical Interval Note:  07/04/2015 8:08 AM  Melanie Lang  has presented today for surgery, with the diagnosis of LOOSE LEFT TKA     The various methods of treatment have been discussed with the patient and family. After consideration of risks, benefits and other options for treatment, the patient has consented to  Procedure(s): LEFT TOTAL KNEE REVISION (Left) as a surgical intervention .  The patient's history has been reviewed, patient examined, no change in status, stable for surgery.  I have reviewed the patient's chart and labs.  Questions were answered to the patient's satisfaction.     [name]

## 2015-07-04 NOTE — Op Note (Signed)
Melanie, Lang NO.:  [id number]  MEDICAL RECORD NO.:  [id number]  LOCATION:  [facility]                         FACILITY:  [facility]  PHYSICIAN:  Gaynelle Arabian, M.D.    DATE OF BIRTH:  07/03/1945  DATE OF PROCEDURE:  07/04/2015 DATE OF DISCHARGE:                              OPERATIVE REPORT   PREOPERATIVE DIAGNOSIS:  Failed left total knee arthroplasty.  POSTOPERATIVE DIAGNOSIS:  Failed left total knee arthroplasty.  PROCEDURE:  Left total knee arthroplasty revision.  SURGEON:  Gaynelle Arabian, MD  ASSISTANT:  Ardeen Jourdain, PA-C  ANESTHESIA:  General.  ESTIMATED BLOOD LOSS:  Minimal.  DRAIN:  Hemovac x1.  TOURNIQUET TIME:  44 minutes at 300 mmHg, down 8 minutes, up additional 18 minutes at 300 mmHg.  COMPLICATIONS:  None.  CONDITION:  Stable to recovery.  BRIEF CLINICAL NOTE:  Melanie Lang is a 70 year old female, had a left total knee arthroplasty done several years ago.  She has had complaints of malalignment, instability, and stiffness.  Exam and history suggested probable loosening of the components with the above mentioned other problems.  She presents now for total knee arthroplasty revision.  PROCEDURE IN DETAIL:  After successful administration of general anesthetic, a tourniquet was placed high on her left thigh and her left lower extremity was prepped and draped in the usual sterile fashion. Extremity was wrapped in Esmarch, knee flexed, and tourniquet inflated to 300 mmHg.  Midline incision was made with a 10 blade through subcutaneous tissue to the extensor mechanism.  A fresh blade was used to make a medial parapatellar arthrotomy.  Soft tissue on the proximal medial tibia was then subperiosteally elevated to the joint line with a knife and into the semimembranous bursa with a Cobb elevator.  I then performed thorough excision of the scar recreating the medial and lateral gutters, superior pouch and the inferior gutters.  I was  then able to evert the patella and flexed the knee 90 degrees.  Removed the locking mechanism to tibial polyethylene and then removed the tibial polyethylene.  The tibia subluxed forward and circumferential retraction placed.  Oscillating saw was used to disrupt the interface between the tibial component bone and tibia components easily removed consistent with being loose.  The extramedullary tibial alignment guide was then placed referencing proximally to medial aspect of the tibial tubercle and distally along the 2nd metatarsal axis and tibial crest.  I resected about 3 mm of the cut bone surface.  Her previous cut was in quite a bit of valgus that ends up taking more off the medial side.  The remainder of the cement in the tibial canal was removed, then I reamed up to 13 mm for 13 mm cemented stem.  We prepared proximally for 29 mm sleeve.  We also prepared proximally through the 2.5 MBT trial through the modular drill and keel punch and a modular drill with the stem extension.  The femur was then addressed.  The femur is removed by disrupting interface between the femoral component and bone utilizing osteotomes. These components removed with essentially no bone loss.  We created a starting hole in the distal femur, I then thoroughly irrigated the canal.  I reamed up  to 16 mm which had an excellent press-fit.  A 16 mm reamer was left in place to serve as our intramedullary cutting guide. The distal femoral cutting block is placed, removed about 3 mm off the distal femur.  Had to go into +4 position to get anything off the lateral side consistent with her being in valgus previously.  The distal femoral block was removed and then a size 2.5 is the most appropriate femoral component.  The 2.5 cutting block for the anterior-posterior chamfer cuts were then placed in a +2 position, effectively raising the stem and lowering the anterior flange of the component down to the anterior cortex of  the femur.  Rotation was marked at the epicondylar axis and also marked by placing a spacer block in the flexion gap to create a rectangular flexion gap.  These 2 lines corresponded giving external rotation consistent with the epicondylar axis.  The anterior posterior and chamfer cuts were made.  Essentially, no bone was removed. I had to go in a +4 position, again, a bone posteriorly 4 mm posterior augments were placed medial and lateral.  That block was removed and intercondylar block was placed to make the cut for the TC3.  Trials were placed.  On the tibial side, the trial of 2.5 MBT revision tray with 5 mm augments medial and lateral, 29 mm sleeve and 13 x 30 stem extension.  This was placed onto tibial surface, impacted and there was excellent fit.  On the femoral side, our trial was a 2.5 TC3 femur with a 16 x 75 stem extension in the +2 position and 5 degrees of valgus.  We had a 4 mm distal lateral augment and 4 mm posterior augments medial and lateral.  A 15 mm insert was placed which had full extension with excellent varus-valgus anterior-posterior balance throughout full range of motion.  The patella tracks normally.  The patella component was an all polyethylene dome patella which tracked normally and would be compatible with the components we were placing on the femur.  I thus did not change the patella.  We did a patelloplasty to remove the tissues surrounding it back to normal tissue.  We then let the tourniquet down for total of 8 minutes and minor bleeding was stopped with cautery.  While the tourniquet was down, the permanent components were assembled on the back table.  After 8 minutes, the extremity was rewrapped in Esmarch and tourniquet reinflated to 300 mmHg.  The trials were removed.  Then I trialed the cement restrictor, which was a size 4 and the appropriate restrictor was placed at the appropriate depth in the tibial canal.  The cut bone surfaces were  then prepared with pulsatile lavage.  Cement was mixed and once ready for implantation, the tibial component which again is the size 2.5 MBT with 5 mm medial and lateral augments and a 29 sleeve and 13 x 30 stem extension that is impacted and extruded cement removed.  On the femoral side, we cemented distally with a press-fit stem and this is a size 2.5 TC3 femur with a 16 x 75 stem extension in a +2 position and 5 degrees of valgus with a 4 mm lateral distal augment and 4 mm posterior augments medial and lateral.  A 50 mm trial insert was placed, knee held in full extension.  All extruded cement removed.  Once the cement was fully hardened, then the permanent 15 mm TC3 rotating platform insert was placed in the tibial tray.  The knee was reduced with excellent stability throughout full range of motion.  Exparel 20 mL mixed with 30 mL of saline were injected into the extensor mechanism, the subcutaneous tissues and the periosteum of the femur.  Additional 30 mL of 0.25% Marcaine were injected into the same tissues.  The extensor mechanism was then closed over Hemovac drain with a running #1 V-Loc suture. Tourniquet was then released for a 2nd tourniquet time of 18 minutes. Another limb of the drain was placed in the subcu tissue and the subcu was closed with interrupted 2-0 Vicryl, subcuticular running 4-0 Monocryl.  Incision was cleaned and dried and Steri-Strips and a bulky sterile dressing were applied.  She was then placed into a knee immobilizer, awakened and transported to recovery in stable condition.  Note that the surgical assistant was a medical necessity for this procedure to do it in a safe and expeditious manner.  Surgical assistant was necessary for retraction of vital ligaments and neurovascular structures and for proper positioning of the limb for safe removal of the old prosthesis and for accurate safe removal of the new prosthesis.     Gaynelle Arabian,  M.D.     FA/MEDQ  D:  07/04/2015  T:  07/04/2015  Job:  [id number]

## 2015-07-04 NOTE — Progress Notes (Signed)
Pt was sitting up in bed when [facility] arrived; husband was bedside. She described why she was here for knee surgery and how helpful her doctor has been. Her husband was attentive and supportive and planning to spend the night. We talked about family/friends. 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 Please page if additional support is needed. Chaplain Ernest Haber, M.Div.   07/04/15 2100  Clinical Encounter Type  Visited With Patient and family together

## 2015-07-04 NOTE — H&P (View-Only) (Signed)
Melanie Lang DOB: 05-29-1945 Married / Language: English / Race: Black or African American Female Date of Admission:  07/04/2015 CC:  Left Knee pain History of Present Illness The patient is a 70 year old female who comes in for a preoperative History and Physical. The patient is scheduled for a left total knee arthroplasty (revision) to be performed by Dr. Dione Lang. [name], MD at [facility] on 07-04-2015. The patient was seen in referral from Melanie Lang (and second opinion evaluation). The patient reports left knee symptoms which began 4 year(s) (7 months) ago without any known injury (left total knee by Dr.Arthur Eulas Lang). The patient describes the severity of the symptoms as varies. The patient describes their pain as sharp and aching.The patient feels that the symptoms are unchanging. Prior to being seen today the patient was previously evaluated by Dr.Arthur Louis Lang) ago. Previous work-up for this problem has included knee x-rays and orthopedic consultation (Dr.Arthur Eulas Lang). Past treatment for this problem has included physical therapy (and arch supports). Symptoms are reported to be located in the left medial knee (mainly) and include knee pain, instability and difficulty ambulating (abnormal gait, knee wants to give way). The patient does not report any radiation of symptoms. Onset of symptoms was gradual with symptoms now occurring constantly. The patient is not currently being treated for this problem. Note for "Knee pain": Also had right total knee by Dr.Arthur Eulas Lang 5 years ago which is doing well. Occaional discomfort. Does wear compression stockings all the time due to varicose veins. The left knee has been problematic for her since the start. She has noticed that the alignment has been off with the leg in valgus. She feels this very unstable. She also has pain. Did not have any issues of fever, chills, swelling or wound drainage with the initial surgery. Unfortunately,  she is 5 years out and things are getting progressively worse. AP and lateral of both knees showed the prosthesis on the right in good alignment with no periprosthetic abnormalities. On the left, both the femur and tibia are in valgus and the overall alignment is about 10 degrees of valgus. No evidence of any definitive loosening. At this point, the most predictable means of improving pain and function is revision total knee arthroplasty. The procedure, risks, potential complications and rehab course are discussed in detail and the patient elects to proceed. Unfortunately, she is having a lot of pain and dysfunction with this valgus abnormality. There is also instability. At this point, I feel the only mechanism for improvement would be to revise the knee. We did discuss this in detail including procedure, risks, potential, complications, rehab course and she would like to proceed. They have been treated conservatively in the past for the above stated problem and despite conservative measures, they continue to have progressive pain and severe functional limitations and dysfunction. They have failed non-operative management including home exercise, medications. It is felt that they would benefit from undergoing revision of total joint replacement. Risks and benefits of the procedure have been discussed with the patient and they elect to proceed with surgery. There are no active contraindications to surgery such as ongoing infection or rapidly progressive neurological disease.   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)  Surgery: Right CTR 12/19/14 Diet-Controlled Diabetes Mellitus  Allergies No Known Drug Allergies  Family History Congestive Heart Failure   father Diabetes Mellitus  grandfather mothers side Severe allergy  mother and father  Social History Alcohol use  never consumed alcohol Current work status  disabled Drug/Alcohol Rehab (Currently)  no Drug/Alcohol Rehab (Previously)  no Exercise  Exercises weekly; does running / walking Illicit drug use  no Living situation  live with spouse Marital status  married Number of flights of stairs before winded  4-5 Pain Contract  no Tobacco / smoke exposure  no Tobacco use  never smoker  Medication History  AmLODIPine Besylate (Oral) Specific strength unknown - Active. Furosemide (Oral) Specific strength unknown - Active. Lisinopril (Oral) Specific strength unknown - Active. Colchicine (0.6MG  Tablet, Oral) Active.  Past Surgical History Breast Biopsy  right Total Knee Replacement  bilateral Carpal Tunnel Surgery - Both   Review of Systems  General Not Present- Chills, Fatigue, Fever, Memory Loss, Night Sweats, Weight Gain and Weight Loss. Skin Not Present- Eczema, Hives, Itching, Lesions and Rash. HEENT Not Present- Dentures, Double Vision, Headache, Hearing Loss, Tinnitus and Visual Loss. Respiratory Not Present- Allergies, Chronic Cough, Coughing up blood, Shortness of breath at rest and Shortness of breath with exertion. Cardiovascular Not Present- Chest Pain, Difficulty Breathing Lying Down, Murmur, Palpitations, Racing/skipping heartbeats and Swelling. Gastrointestinal Not Present- Abdominal Pain, Bloody Stool, Constipation, Diarrhea, Difficulty Swallowing, Heartburn, Jaundice, Loss of appetitie, Nausea and Vomiting. Female Genitourinary Not Present- Blood in Urine, Discharge, Flank Pain, Incontinence, Painful Urination, Urgency, Urinary frequency, Urinary Retention, Urinating at Night and Weak urinary stream. Musculoskeletal Present- Joint Pain and Joint Swelling. Not Present- Back Pain, Morning Stiffness, Muscle Pain, Muscle Weakness and  Spasms. Neurological Not Present- Blackout spells, Difficulty with balance, Dizziness, Paralysis, Tremor and Weakness. Psychiatric Not Present- Insomnia.  Vitals Weight: 189 lb Height: 65in Body Surface Area: 1.93 m Body Mass Index: 31.45 kg/m  Pulse: 76 (Regular)  BP: 162/92 (Sitting, Right Arm, Standard) Physical Exam General Mental Status -Alert, cooperative and good historian. General Appearance-pleasant, Not in acute distress. Orientation-Oriented X3. Build & Nutrition-Well nourished and Well developed.  Head and Neck Head-normocephalic, atraumatic . Neck Global Assessment - supple, no bruit auscultated on the right, no bruit auscultated on the left. Note: upper and lower dentures   Eye Vision-Wears corrective lenses. Pupil - Bilateral-Regular and Round. Motion - Bilateral-EOMI.  Chest and Lung Exam Auscultation Breath sounds - clear at anterior chest wall and clear at posterior chest wall. Adventitious sounds - No Adventitious sounds.  Cardiovascular Auscultation Rhythm - Regular rate and rhythm. Heart Sounds - S1 WNL and S2 WNL. Murmurs & Other Heart Sounds - Auscultation of the heart reveals - No Murmurs.  Abdomen Palpation/Percussion Tenderness - Abdomen is non-tender to palpation. Rigidity (guarding) - Abdomen is soft. Auscultation Auscultation of the abdomen reveals - Bowel sounds normal.  Female Genitourinary Note: Not done, not pertinent to present illness   Musculoskeletal Note: She is alert and oriented, no apparent distress. Evaluation of her hips shows normal motion, no discomfort. Her right knee looks fine. Range of motion of right knee is about 0 to 115 with no tenderness or instability. Left knee shows moderate valgus alignment. Range is about 0 to 95. There is a fair amount of varus and valgus, and AP instability noted about the left knee. Her gait pattern is antalgic on the left.  RADIOGRAPHS AP and lateral of both  knees showed the prosthesis on the right in good alignment with no periprosthetic abnormalities. On the left, both  the femur and tibia are in valgus and the overall alignment is about 10 degrees of valgus. No evidence of any definitive loosening.  Assessment & Plan Complication of internal left knee prosthesis, unspecified complication, initial encounter (T84.9XXA, [id number])  Note:Surgical Plans: Revisoin Left Total Knee  Disposition: Home  PCP: Dr. Nile Dear - Patient has been seen preoperatively and felt to be stable for surgery.  IV TXA  Anesthesia Issues: None  Signed electronically by Ok Edwards, III PA-C

## 2015-07-04 NOTE — Anesthesia Postprocedure Evaluation (Signed)
Anesthesia Post Note  Patient: Melanie Lang  Procedure(s) Performed: Procedure(s) (LRB): LEFT TOTAL KNEE REVISION (Left)  Patient location during evaluation: PACU Anesthesia Type: Spinal and MAC Level of consciousness: awake and alert Pain management: pain level controlled Vital Signs Assessment: post-procedure vital signs reviewed and stable Respiratory status: spontaneous breathing and respiratory function stable Cardiovascular status: blood pressure returned to baseline and stable Postop Assessment: spinal receding Anesthetic complications: no    Last Vitals:  Filed Vitals:   07/04/15 1236 07/04/15 1329  BP: 138/80 136/85  Pulse: 75 77  Temp: 36.8 C 37 C  Resp: 14 14    Last Pain:  Filed Vitals:   07/04/15 1334  PainSc: 4                  Tiajuana Amass

## 2015-07-04 NOTE — Brief Op Note (Signed)
07/04/2015  10:11 AM  PATIENT:  Melanie Lang  70 y.o. female  PRE-OPERATIVE DIAGNOSIS:  Failed LEFT TKA     POST-OPERATIVE DIAGNOSIS:  Failed LEFT TKA     PROCEDURE:  Procedure(s): LEFT TOTAL KNEE REVISION (Left)  SURGEON:  Surgeon(s) and Role:    * Gaynelle Arabian, MD - Primary  PHYSICIAN ASSISTANT:   ASSISTANTS: Ardeen Jourdain, PA-C   ANESTHESIA:   general  EBL:  Total I/O In: 2000 [I.V.:2000] Out: 600 [Urine:400; Blood:200]  BLOOD ADMINISTERED:none  DRAINS: (Medium) Hemovact drain(s) in the left knee with  Suction Open   LOCAL MEDICATIONS USED:  OTHER Exparel  COUNTS:  YES  TOURNIQUET:   Total Tourniquet Time Documented: Thigh (Left) - 44 minutes Thigh (Left) - 18 minutes Total: Thigh (Left) - 62 minutes   DICTATION: .Other Dictation: Dictation Number [id number]  PLAN OF CARE: Admit to inpatient   PATIENT DISPOSITION:  PACU - hemodynamically stable.

## 2015-07-05 LAB — BASIC METABOLIC PANEL
ANION GAP: 7 (ref 5–15)
BUN: 12 mg/dL (ref 6–20)
CHLORIDE: 103 mmol/L (ref 101–111)
CO2: 27 mmol/L (ref 22–32)
Calcium: 9.1 mg/dL (ref 8.9–10.3)
Creatinine, Ser: 0.71 mg/dL (ref 0.44–1.00)
GFR calc Af Amer: >60 mL/min (ref >60)
GFR calc non Af Amer: >60 mL/min (ref >60)
Glucose, Bld: 126 mg/dL — ABNORMAL HIGH (ref 65–99)
POTASSIUM: 3.9 mmol/L (ref 3.5–5.1)
SODIUM: 137 mmol/L (ref 135–145)

## 2015-07-05 LAB — CBC
HCT: 36.5 % (ref 36.0–46.0)
HEMOGLOBIN: 12 g/dL (ref 12.0–15.0)
MCH: 28 pg (ref 26.0–34.0)
MCHC: 32.9 g/dL (ref 30.0–36.0)
MCV: 85.3 fL (ref 78.0–100.0)
Platelets: 267 10*3/uL (ref 150–400)
RBC: 4.28 MIL/uL (ref 3.87–5.11)
RDW: 13.5 % (ref 11.5–15.5)
WBC: 15.4 10*3/uL — AB (ref 4.0–10.5)

## 2015-07-05 LAB — GLUCOSE, CAPILLARY
GLUCOSE-CAPILLARY: 112 mg/dL — AB (ref 65–99)
GLUCOSE-CAPILLARY: 177 mg/dL — AB (ref 65–99)
GLUCOSE-CAPILLARY: 123 mg/dL — AB (ref 65–99)

## 2015-07-05 NOTE — Care Management Note (Signed)
Case Management Note  Patient Details  Name: Melanie Lang MRN: [id number] Date of Birth: November 27, 1945  Subjective/Objective:                  L total knee revision Action/Plan: Discharge planning Expected Discharge Date:                  Expected Discharge Plan:  [place]  In-House Referral:     Discharge planning Services  CM Consult  Post Acute Care Choice:  Home Health Choice offered to:  Patient  DME Arranged:  3-N-1 DME Agency:  [facility]  PT [facility] Agency:  [place]  Status of Service:  Completed, signed off  Medicare Important Message Given:    Date Medicare IM Given:    Medicare IM give by:    Date Additional Medicare IM Given:    Additional Medicare Important Message give by:     If discussed at [place] of Stay Meetings, dates discussed:    Additional Comments: CM met with pt in room to offer choice of home health agency.  Pt chooses [facility] to render HHPT.  Referral called to [facility].  Cm called [facility] rep, Melissa to please deliver the 3n1 to room prior to discharge.  No other CM needs were communicated. Dellie Catholic, RN 07/05/2015, 2:22 PM

## 2015-07-05 NOTE — Progress Notes (Signed)
Physical Therapy Treatment Note    07/05/15 1542  PT Visit Information  Last PT Received On 07/05/15  Assistance Needed +1  History of Present Illness Pt is a 70 year old female s/p L total knee revision  Subjective Data  Subjective Pt ambulated in hallway and performed LE exercises.  Pt progressing well.  Precautions  Precautions Knee  Required Braces or Orthoses Knee Immobilizer - Left  Restrictions  Other Position/Activity Restrictions WBAT  Pain Assessment  Pain Assessment 0-10  Pain Score 3  Pain Location L knee  Pain Descriptors / Indicators Aching;Sore  Pain Intervention(s) Limited activity within patient's tolerance;Monitored during session;Repositioned;Ice applied  Cognition  Arousal/Alertness Awake/alert  Behavior During Therapy WFL for tasks assessed/performed  Overall Cognitive Status Within Functional Limits for tasks assessed  Transfers  Overall transfer level Needs assistance  Equipment used Rolling walker (2 wheeled)  Transfers Sit to/from Stand  Sit to Stand Min guard  General transfer comment verbal cues for UE and LE positioning  Ambulation/Gait  Ambulation/Gait assistance Min guard  Ambulation Distance (Feet) 200 Feet  Assistive device Rolling walker (2 wheeled)  Gait Pattern/deviations Step-through pattern;Decreased stance time - left;Antalgic  General Gait Details verbal cues for sequence, RW distance, step length  Exercises  Exercises Total Joint  Total Joint Exercises  Ankle Circles/Pumps AROM;10 reps;Both  Quad Sets AROM;Both;10 reps  Short Arc Quad AROM;10 reps;Left  Heel Slides AAROM;Left;10 reps  Hip ABduction/ADduction AAROM;Left;10 reps  Straight Leg Raises AAROM;Left;10 reps  Goniometric ROM approx 60* AAROM knee flexion  PT - End of Session  Equipment Utilized During Treatment Left knee immobilizer  Activity Tolerance Patient tolerated treatment well  Patient left with call bell/phone within reach;in chair;with family/visitor present   PT - Assessment/Plan  PT Plan Current plan remains appropriate  PT Frequency (ACUTE ONLY) 7X/week  Follow Up Recommendations Home health PT  PT equipment Rolling walker with 5" wheels  PT Goal Progression  Progress towards PT goals Progressing toward goals  PT Time Calculation  PT Start Time (ACUTE ONLY) 1354  PT Stop Time (ACUTE ONLY) 1413  PT Time Calculation (min) (ACUTE ONLY) 19 min  PT General Charges  $$ ACUTE PT VISIT 1 Procedure  PT Treatments  $Therapeutic Exercise 8-22 mins   Carmelia Bake, PT, DPT 07/05/2015 Pager: [phone]

## 2015-07-05 NOTE — Progress Notes (Signed)
Pt pulled Hemovac out inadvertently. Small amt of bloody drainage noted.  2x2 gauze placed at site where Hemovac was dislodged. Will continue to monitor per Doctor orders and unit protocol.

## 2015-07-05 NOTE — Discharge Instructions (Signed)
° °Dr. Frank [name] °Total Joint Specialist °[facility] °[address] °, [place] [address] °[phone] ° °TOTAL KNEE REPLACEMENT POSTOPERATIVE DIRECTIONS ° °Knee Rehabilitation, Guidelines Following Surgery  °Results after knee surgery are often greatly improved when you follow the exercise, range of motion and muscle strengthening exercises prescribed by your doctor. Safety measures are also important to protect the knee from further injury. Any time any of these exercises cause you to have increased pain or swelling in your knee joint, decrease the amount until you are comfortable again and slowly increase them. If you have problems or questions, call your caregiver or physical therapist for advice.  ° °HOME CARE INSTRUCTIONS  °Remove items at home which could result in a fall. This includes throw rugs or furniture in walking pathways.  °· ICE to the affected knee every three hours for 30 minutes at a time and then as needed for pain and swelling.  Continue to use ice on the knee for pain and swelling from surgery. You may notice swelling that will progress down to the foot and ankle.  This is normal after surgery.  Elevate the leg when you are not up walking on it.   °· Continue to use the breathing machine which will help keep your temperature down.  It is common for your temperature to cycle up and down following surgery, especially at night when you are not up moving around and exerting yourself.  The breathing machine keeps your lungs expanded and your temperature down. °· Do not place pillow under knee, focus on keeping the knee straight while resting ° °DIET °You may resume your previous home diet once your are discharged from the hospital. ° °DRESSING / WOUND CARE / SHOWERING °You may start showering once you are discharged home but do not submerge the incision under water. Just pat the incision dry and apply a dry gauze dressing on daily. °Change the surgical dressing  daily and reapply a dry dressing each time. ° °ACTIVITY °Walk with your walker as instructed. °Use walker as long as suggested by your caregivers. °Avoid periods of inactivity such as sitting longer than an hour when not asleep. This helps prevent blood clots.  °You may resume a sexual relationship in one month or when given the OK by your doctor.  °You may return to work once you are cleared by your doctor.  °Do not drive a car for 6 weeks or until released by you surgeon.  °Do not drive while taking narcotics. ° °WEIGHT BEARING °Weight bearing as tolerated with assist device (walker, cane, etc) as directed, use it as long as suggested by your surgeon or therapist, typically at least 4-6 weeks. ° °POSTOPERATIVE CONSTIPATION PROTOCOL °Constipation - defined medically as fewer than three stools per week and severe constipation as less than one stool per week. ° °One of the most common issues patients have following surgery is constipation.  Even if you have a regular bowel pattern at home, your normal regimen is likely to be disrupted due to multiple reasons following surgery.  Combination of anesthesia, postoperative narcotics, change in appetite and fluid intake all can affect your bowels.  In order to avoid complications following surgery, here are some recommendations in order to help you during your recovery period. ° °Colace (docusate) - Pick up an over-the-counter form of Colace or another stool softener and take twice a day as long as you are requiring postoperative pain medications.  Take with a full glass of water   daily.  If you experience loose stools or diarrhea, hold the colace until you stool forms back up.  If your symptoms do not get better within 1 week or if they get worse, check with your doctor. ° °Dulcolax (bisacodyl) - Pick up over-the-counter and take as directed by the product packaging as needed to assist with the movement of your bowels.  Take with a full glass of water.  Use this product as  needed if not relieved by Colace only.  ° °MiraLax (polyethylene glycol) - Pick up over-the-counter to have on hand.  MiraLax is a solution that will increase the amount of water in your bowels to assist with bowel movements.  Take as directed and can mix with a glass of water, juice, soda, coffee, or tea.  Take if you go more than two days without a movement. °Do not use MiraLax more than once per day. Call your doctor if you are still constipated or irregular after using this medication for 7 days in a row. ° °If you continue to have problems with postoperative constipation, please contact the office for further assistance and recommendations.  If you experience "the worst abdominal pain ever" or develop nausea or vomiting, please contact the office immediatly for further recommendations for treatment. ° °ITCHING ° If you experience itching with your medications, try taking only a single pain pill, or even half a pain pill at a time.  You can also use Benadryl over the counter for itching or also to help with sleep.  ° °TED HOSE STOCKINGS °Wear the elastic stockings on both legs for three weeks following surgery during the day but you may remove then at night for sleeping. ° °MEDICATIONS °See your medication summary on the “After Visit Summary” that the nursing staff will review with you prior to discharge.  You may have some home medications which will be placed on hold until you complete the course of blood thinner medication.  It is important for you to complete the blood thinner medication as prescribed by your surgeon.  Continue your approved medications as instructed at time of discharge. ° °PRECAUTIONS °If you experience chest pain or shortness of breath - call 911 immediately for transfer to the hospital emergency department.  °If you develop a fever greater that 101 F, purulent drainage from wound, increased redness or drainage from wound, foul odor from the wound/dressing, or calf pain - CONTACT YOUR  SURGEON.   °                                                °FOLLOW-UP APPOINTMENTS °Make sure you keep all of your appointments after your operation with your surgeon and caregivers. You should call the office at the above phone number and make an appointment for approximately two weeks after the date of your surgery or on the date instructed by your surgeon outlined in the "After Visit Summary". ° ° °RANGE OF MOTION AND STRENGTHENING EXERCISES  °Rehabilitation of the knee is important following a knee injury or an operation. After just a few days of immobilization, the muscles of the thigh which control the knee become weakened and shrink (atrophy). Knee exercises are designed to build up the tone and strength of the thigh muscles and to improve knee motion. Often times heat used for twenty to thirty minutes before working out will loosen   up your tissues and help with improving the range of motion but do not use heat for the first two weeks following surgery. These exercises can be done on a training (exercise) mat, on the floor, on a table or on a bed. Use what ever works the best and is most comfortable for you Knee exercises include:  Leg Lifts - While your knee is still immobilized in a splint or cast, you can do straight leg raises. Lift the leg to 60 degrees, hold for 3 sec, and slowly lower the leg. Repeat 10-20 times 2-3 times daily. Perform this exercise against resistance later as your knee gets better.  Quad and Hamstring Sets - Tighten up the muscle on the front of the thigh (Quad) and hold for 5-10 sec. Repeat this 10-20 times hourly. Hamstring sets are done by pushing the foot backward against an object and holding for 5-10 sec. Repeat as with quad sets.   Leg Slides: Lying on your back, slowly slide your foot toward your buttocks, bending your knee up off the floor (only go as far as is comfortable). Then slowly slide your foot back down until your leg is flat on the floor again.  Angel Wings:  Lying on your back spread your legs to the side as far apart as you can without causing discomfort.  A rehabilitation program following serious knee injuries can speed recovery and prevent re-injury in the future due to weakened muscles. Contact your doctor or a physical therapist for more information on knee rehabilitation.   IF YOU ARE TRANSFERRED TO A SKILLED REHAB FACILITY If the patient is transferred to a skilled rehab facility following release from the hospital, a list of the current medications will be sent to the facility for the patient to continue.  When discharged from the skilled rehab facility, please have the facility set up the patient's [place] prior to being released. Also, the skilled facility will be responsible for providing the patient with their medications at time of release from the facility to include their pain medication, the muscle relaxants, and their blood thinner medication. If the patient is still at the rehab facility at time of the two week follow up appointment, the skilled rehab facility will also need to assist the patient in arranging follow up appointment in our office and any transportation needs.  MAKE SURE YOU:  Understand these instructions.  Get help right away if you are not doing well or get worse.    Pick up stool softner and laxative for home use following surgery while on pain medications. Do not submerge incision under water. Please use good hand washing techniques while changing dressing each day. May shower starting three days after surgery. Please use a clean towel to pat the incision dry following showers. Continue to use ice for pain and swelling after surgery. Do not use any lotions or creams on the incision until instructed by your surgeon.  Information on my medicine - XARELTO (Rivaroxaban)  This medication education was reviewed with me or my healthcare representative as part of my discharge preparation.  The  pharmacist that spoke with me during my hospital stay was:  Jhonnie Garner, Stu-PharmD  Why was Xarelto prescribed for you? Xarelto was prescribed for you to reduce the risk of blood clots forming after orthopedic surgery. The medical term for these abnormal blood clots is venous thromboembolism (VTE).  What do you need to know about xarelto ? Take your Xarelto ONCE DAILY at  the same time every day. You may take it either with or without food.  If you have difficulty swallowing the tablet whole, you may crush it and mix in applesauce just prior to taking your dose.  Take Xarelto exactly as prescribed by your doctor and DO NOT stop taking Xarelto without talking to the doctor who prescribed the medication.  Stopping without other VTE prevention medication to take the place of Xarelto may increase your risk of developing a clot.  After discharge, you should have regular check-up appointments with your healthcare provider that is prescribing your Xarelto.    What do you do if you miss a dose? If you miss a dose, take it as soon as you remember on the same day then continue your regularly scheduled once daily regimen the next day. Do not take two doses of Xarelto on the same day.   Important Safety Information A possible side effect of Xarelto is bleeding. You should call your healthcare provider right away if you experience any of the following: ? Bleeding from an injury or your nose that does not stop. ? Unusual colored urine (red or dark brown) or unusual colored stools (red or black). ? Unusual bruising for unknown reasons. ? A serious fall or if you hit your head (even if there is no bleeding).  Some medicines may interact with Xarelto and might increase your risk of bleeding while on Xarelto. To help avoid this, consult your healthcare provider or pharmacist prior to using any new prescription or non-prescription medications, including herbals, vitamins, non-steroidal  anti-inflammatory drugs (NSAIDs) and supplements.  This website has more information on Xarelto: [email or web address]

## 2015-07-05 NOTE — Progress Notes (Signed)
OT Cancellation Note  Patient Details Name: Melanie Lang MRN: [id number] DOB: May 14, 1945   Cancelled Treatment:    Reason Eval/Treat Not Completed: Other (comment)  Pt in CPM. Pt would like OT session tomorrow to remind her regarding ADL activity s/p TKR.\ Will check on pt in the morning.  Betsy Pries 07/05/2015, 3:38 PM

## 2015-07-05 NOTE — Progress Notes (Signed)
   Subjective: 1 Day Post-Op Procedure(s) (LRB): LEFT TOTAL KNEE REVISION (Left) Patient reports pain as mild.   Patient seen in rounds with Dr. Wynelle Link. Patient is well, and has had no acute complaints or problems. Reports that she is feeling ok. No SOB or chest pain.  We will start therapy today.  Plan is to go Home after hospital stay.  Objective: Vital signs in last 24 hours: Temp:  [97.5 F (36.4 C)-98.6 F (37 C)] 98.6 F (37 C) (06/15 0507) Pulse Rate:  [66-80] 66 (06/15 0507) Resp:  [11-16] 16 (06/15 0507) BP: (106-143)/(64-85) 140/76 mmHg (06/15 0507) SpO2:  [98 %-100 %] 100 % (06/15 0507) Weight:  [90.719 kg (200 lb)] 90.719 kg (200 lb) (06/14 1140)  Intake/Output from previous day:  Intake/Output Summary (Last 24 hours) at 07/05/15 0736 Last data filed at 07/05/15 0508  Gross per 24 hour  Intake   4045 ml  Output   4730 ml  Net   -685 ml     Labs:  Recent Labs  07/05/15 0438  HGB 12.0    Recent Labs  07/05/15 0438  WBC 15.4*  RBC 4.28  HCT 36.5  PLT 267    Recent Labs  07/05/15 0438  NA 137  K 3.9  CL 103  CO2 27  BUN 12  CREATININE 0.71  GLUCOSE 126*  CALCIUM 9.1    EXAM General - Patient is Alert and Oriented Extremity - Neurologically intact Intact pulses distally Dorsiflexion/Plantar flexion intact No cellulitis present Compartment soft Dressing - dressing C/D/I Motor Function - intact, moving foot and toes well on exam.  Hemovac inadvertently removed by the patient last night.  Past Medical History  Diagnosis Date  . Arthritis   . Asthma   . Allergy   . Hyperlipidemia   . Hypertension   . Diabetes mellitus without complication ([place])     borderline controlled with diet   . 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   Weight as of this encounter: 90.719 kg (200 lb). Advance diet Up with therapy D/C IV fluids when tolerating POs well  DVT Prophylaxis - Xarelto Weight-Bearing as tolerated  D/C O2 and Pulse OX and try on Room Air  Will have her get up with therapy today. Plan for DC home tomorrow.   Ardeen Jourdain, PA-C Orthopaedic Surgery 07/05/2015, 7:36 AM

## 2015-07-05 NOTE — Evaluation (Signed)
Physical Therapy Evaluation Patient Details Name: Melanie Lang MRN: [id number] DOB: 1945-02-23 Today's Date: 07/05/2015   History of Present Illness  Pt is a 70 year old female s/p L total knee revision  Clinical Impression  Pt is s/p L knee revision resulting in the deficits listed below (see PT Problem List).  Pt will benefit from skilled PT to increase their independence and safety with mobility to allow discharge to the venue listed below.  Pt mobilizing well POD #1 and plans to d/c home likely tomorrow.      Follow Up Recommendations Home health PT    Equipment Recommendations  Rolling walker with 5" wheels    Recommendations for Other Services       Precautions / Restrictions Precautions Precautions: Knee Required Braces or Orthoses: Knee Immobilizer - Left Restrictions Weight Bearing Restrictions: No Other Position/Activity Restrictions: WBAT      Mobility  Bed Mobility Overal bed mobility: Needs Assistance Bed Mobility: Supine to Sit     Supine to sit: Min assist     General bed mobility comments: verbal cues for technique  Transfers Overall transfer level: Needs assistance Equipment used: Rolling walker (2 wheeled) Transfers: Sit to/from Stand Sit to Stand: Min guard         General transfer comment: verbal cues for UE and LE positioning  Ambulation/Gait Ambulation/Gait assistance: Min guard Ambulation Distance (Feet): 120 Feet Assistive device: Rolling walker (2 wheeled) Gait Pattern/deviations: Step-to pattern;Antalgic     General Gait Details: verbal cues for sequence, RW distance, step length  Stairs            Wheelchair Mobility    Modified Rankin (Stroke Patients Only)       Balance                                             Pertinent Vitals/Pain Pain Assessment: 0-10 Pain Score: 3  Pain Location: L knee Pain Descriptors / Indicators: Aching;Sore Pain Intervention(s): Limited activity within  patient's tolerance;Monitored during session;Repositioned    Home Living Family/patient expects to be discharged to:: Private residence Living Arrangements: Spouse/significant other   Type of Home: House Home Access: Stairs to enter   Technical brewer of Steps: 1 Home Layout: One level Home Equipment: None      Prior Function Level of Independence: Independent               Hand Dominance        Extremity/Trunk Assessment               Lower Extremity Assessment: LLE deficits/detail   LLE Deficits / Details: unable to perform SLR, maintained KI, ROM TBA     Communication   Communication: No difficulties  Cognition Arousal/Alertness: Awake/alert Behavior During Therapy: WFL for tasks assessed/performed Overall Cognitive Status: Within Functional Limits for tasks assessed                      General Comments      Exercises        Assessment/Plan    PT Assessment Patient needs continued PT services  PT Diagnosis Difficulty walking;Acute pain   PT Problem List Decreased strength;Decreased range of motion;Decreased mobility;Pain  PT Treatment Interventions DME instruction;Gait training;Functional mobility training;Patient/family education;Therapeutic activities;Therapeutic exercise;Stair training   PT Goals (Current goals can be found in the Care Plan section) Acute  Rehab PT Goals PT Goal Formulation: With patient Time For Goal Achievement: 07/10/15 Potential to Achieve Goals: Good    Frequency 7X/week   Barriers to discharge        Co-evaluation               End of Session Equipment Utilized During Treatment: Gait belt;Left knee immobilizer Activity Tolerance: Patient tolerated treatment well Patient left: in chair;with call bell/phone within reach;with chair alarm set           Time: 0905-0920 PT Time Calculation (min) (ACUTE ONLY): 15 min   Charges:   PT Evaluation $PT Eval Low Complexity: 1 Procedure      PT G Codes:        Melanie Lang,Melanie Lang 07/05/2015, 11:47 AM Melanie Lang, PT, DPT 07/05/2015 Pager: [phone]

## 2015-07-06 LAB — CBC
HEMATOCRIT: 36.6 % (ref 36.0–46.0)
HEMOGLOBIN: 12 g/dL (ref 12.0–15.0)
MCH: 27.9 pg (ref 26.0–34.0)
MCHC: 32.8 g/dL (ref 30.0–36.0)
MCV: 85.1 fL (ref 78.0–100.0)
Platelets: 251 10*3/uL (ref 150–400)
RBC: 4.3 MIL/uL (ref 3.87–5.11)
RDW: 13.5 % (ref 11.5–15.5)
WBC: 15.1 10*3/uL — ABNORMAL HIGH (ref 4.0–10.5)

## 2015-07-06 LAB — GLUCOSE, CAPILLARY: Glucose-Capillary: 103 mg/dL — ABNORMAL HIGH (ref 65–99)

## 2015-07-06 LAB — BASIC METABOLIC PANEL
Anion gap: 6 (ref 5–15)
BUN: 12 mg/dL (ref 6–20)
CALCIUM: 9.3 mg/dL (ref 8.9–10.3)
CHLORIDE: 102 mmol/L (ref 101–111)
CO2: 30 mmol/L (ref 22–32)
CREATININE: 0.65 mg/dL (ref 0.44–1.00)
GFR calc Af Amer: >60 mL/min (ref >60)
GFR calc non Af Amer: >60 mL/min (ref >60)
GLUCOSE: 124 mg/dL — AB (ref 65–99)
Potassium: 3.6 mmol/L (ref 3.5–5.1)
Sodium: 138 mmol/L (ref 135–145)

## 2015-07-06 NOTE — Discharge Summary (Signed)
Physician Discharge Summary   Patient ID: Melanie Lang MRN: [id number] DOB/AGE: 1945-11-08 70 y.o.  Admit date: 07/04/2015 Discharge date: 07/06/2015  Primary Diagnosis: Failed left total knee arthroplasty   Admission Diagnoses:  Past Medical History  Diagnosis Date  . Arthritis   . Asthma   . Allergy   . Hyperlipidemia   . Hypertension   . Diabetes mellitus without complication ([place])     borderline controlled with diet   . GERD (gastroesophageal reflux disease)    Discharge Diagnoses:   Principal Problem:   Failed total left knee replacement (HCC) Active Problems:   OA (osteoarthritis) of knee  Estimated body mass index is 33.28 kg/(m^2) as calculated from the following:   Height as of this encounter: '5\' 5"'$  (1.651 m).   Weight as of this encounter: 90.719 kg (200 lb).  Procedure:  Procedure(s) (LRB): LEFT TOTAL KNEE REVISION (Left)   Consults: None  HPI: The patient reports left knee symptoms which began 4 year(s) (7 months) ago without any known injury (left total knee by Dr.[name]). The patient describes the severity of the symptoms as varies. The patient describes their pain as sharp and aching.The patient feels that the symptoms are unchanging. Prior to being seen today the patient was previously evaluated by Dr.[name]) ago. Previous work-up for this problem has included knee x-rays and orthopedic consultation (Dr.[name]). Past treatment for this problem has included physical therapy (and arch supports). Symptoms are reported to be located in the left medial knee (mainly) and include knee pain, instability and difficulty ambulating (abnormal gait, knee wants to give way). The patient does not report any radiation of symptoms. Onset of symptoms was gradual with symptoms now occurring constantly. The patient is not currently being treated for this problem. Note for "Knee pain": Also had right total knee by Dr.[name] 5 years ago which  is doing well. Occaional discomfort. Does wear compression stockings all the time due to varicose veins. The left knee has been problematic for her since the start. She has noticed that the alignment has been off with the leg in valgus. She feels this very unstable. She also has pain. Did not have any issues of fever, chills, swelling or wound drainage with the initial surgery. Unfortunately, she is 5 years out and things are getting progressively worse. AP and lateral of both knees showed the prosthesis on the right in good alignment with no periprosthetic abnormalities. On the left, both the femur and tibia are in valgus and the overall alignment is about 10 degrees of valgus. No evidence of any definitive loosening. At this point, the most predictable means of improving pain and function is revision total knee arthroplasty. The procedure, risks, potential complications and rehab course are discussed in detail and the patient elects to proceed. Unfortunately, she is having a lot of pain and dysfunction with this valgus abnormality. There is also instability. At this point, I feel the only mechanism for improvement would be to revise the knee. We did discuss this in detail including procedure, risks, potential, complications, rehab course and she would like to proceed. They have been treated conservatively in the past for the above stated problem and despite conservative measures, they continue to have progressive pain and severe functional limitations and dysfunction. They have failed non-operative management including home exercise, medications. It is felt that they would benefit from undergoing revision of total joint replacement. Risks and benefits of the procedure have been discussed with the patient and  they elect to proceed with surgery. There are no active contraindications to surgery such as ongoing infection or rapidly progressive neurological disease.  Laboratory Data: Admission on 07/04/2015    Component Date Value Ref Range Status  . Glucose-Capillary 07/04/2015 125* 65 - 99 mg/dL Final  . Comment 1 07/04/2015 Notify RN   Final  . Glucose-Capillary 07/04/2015 113* 65 - 99 mg/dL Final  . Comment 1 07/04/2015 Notify RN   Final  . Comment 2 07/04/2015 Document in Chart   Final  . Glucose-Capillary 07/04/2015 141* 65 - 99 mg/dL Final  . Glucose-Capillary 07/04/2015 155* 65 - 99 mg/dL Final  . WBC 07/05/2015 15.4* 4.0 - 10.5 K/uL Final  . RBC 07/05/2015 4.28  3.87 - 5.11 MIL/uL Final  . Hemoglobin 07/05/2015 12.0  12.0 - 15.0 g/dL Final  . HCT 07/05/2015 36.5  36.0 - 46.0 % Final  . MCV 07/05/2015 85.3  78.0 - 100.0 fL Final  . MCH 07/05/2015 28.0  26.0 - 34.0 pg Final  . MCHC 07/05/2015 32.9  30.0 - 36.0 g/dL Final  . RDW 07/05/2015 13.5  11.5 - 15.5 % Final  . Platelets 07/05/2015 267  150 - 400 K/uL Final  . Sodium 07/05/2015 137  135 - 145 mmol/L Final  . Potassium 07/05/2015 3.9  3.5 - 5.1 mmol/L Final  . Chloride 07/05/2015 103  101 - 111 mmol/L Final  . CO2 07/05/2015 27  22 - 32 mmol/L Final  . Glucose, Bld 07/05/2015 126* 65 - 99 mg/dL Final  . BUN 07/05/2015 12  6 - 20 mg/dL Final  . Creatinine, Ser 07/05/2015 0.71  0.44 - 1.00 mg/dL Final  . Calcium 07/05/2015 9.1  8.9 - 10.3 mg/dL Final  . GFR calc non Af Amer 07/05/2015 >60  >60 mL/min Final  . GFR calc Af Amer 07/05/2015 >60  >60 mL/min Final   Comment: (NOTE) The eGFR has been calculated using the CKD EPI equation. This calculation has not been validated in all clinical situations. eGFR's persistently <60 mL/min signify possible Chronic Kidney Disease.   . Anion gap 07/05/2015 7  5 - 15 Final  . Glucose-Capillary 07/04/2015 130* 65 - 99 mg/dL Final  . Glucose-Capillary 07/05/2015 112* 65 - 99 mg/dL Final  . Glucose-Capillary 07/05/2015 177* 65 - 99 mg/dL Final  . WBC 07/06/2015 15.1* 4.0 - 10.5 K/uL Final  . RBC 07/06/2015 4.30  3.87 - 5.11 MIL/uL Final  . Hemoglobin 07/06/2015 12.0  12.0 - 15.0 g/dL  Final  . HCT 07/06/2015 36.6  36.0 - 46.0 % Final  . MCV 07/06/2015 85.1  78.0 - 100.0 fL Final  . MCH 07/06/2015 27.9  26.0 - 34.0 pg Final  . MCHC 07/06/2015 32.8  30.0 - 36.0 g/dL Final  . RDW 07/06/2015 13.5  11.5 - 15.5 % Final  . Platelets 07/06/2015 251  150 - 400 K/uL Final  . Sodium 07/06/2015 138  135 - 145 mmol/L Final  . Potassium 07/06/2015 3.6  3.5 - 5.1 mmol/L Final  . Chloride 07/06/2015 102  101 - 111 mmol/L Final  . CO2 07/06/2015 30  22 - 32 mmol/L Final  . Glucose, Bld 07/06/2015 124* 65 - 99 mg/dL Final  . BUN 07/06/2015 12  6 - 20 mg/dL Final  . Creatinine, Ser 07/06/2015 0.65  0.44 - 1.00 mg/dL Final  . Calcium 07/06/2015 9.3  8.9 - 10.3 mg/dL Final  . GFR calc non Af Amer 07/06/2015 >60  >60 mL/min Final  . GFR calc  Af Amer 07/06/2015 >60  >60 mL/min Final   Comment: (NOTE) The eGFR has been calculated using the CKD EPI equation. This calculation has not been validated in all clinical situations. eGFR's persistently <60 mL/min signify possible Chronic Kidney Disease.   . Anion gap 07/06/2015 6  5 - 15 Final  . Glucose-Capillary 07/05/2015 123* 65 - 99 mg/dL Final  . Glucose-Capillary 07/06/2015 103* 65 - 99 mg/dL Final  Hospital Outpatient Visit on 06/26/2015  Component Date Value Ref Range Status  . aPTT 06/26/2015 32  24 - 37 seconds Final  . WBC 06/26/2015 8.6  4.0 - 10.5 K/uL Final  . RBC 06/26/2015 4.70  3.87 - 5.11 MIL/uL Final  . Hemoglobin 06/26/2015 13.0  12.0 - 15.0 g/dL Final  . HCT 82/68/5887 40.3  36.0 - 46.0 % Final  . MCV 06/26/2015 85.7  78.0 - 100.0 fL Final  . MCH 06/26/2015 27.7  26.0 - 34.0 pg Final  . MCHC 06/26/2015 32.3  30.0 - 36.0 g/dL Final  . RDW 99/95/4481 13.6  11.5 - 15.5 % Final  . Platelets 06/26/2015 310  150 - 400 K/uL Final  . Sodium 06/26/2015 140  135 - 145 mmol/L Final  . Potassium 06/26/2015 3.5  3.5 - 5.1 mmol/L Final  . Chloride 06/26/2015 105  101 - 111 mmol/L Final  . CO2 06/26/2015 27  22 - 32 mmol/L Final   . Glucose, Bld 06/26/2015 109* 65 - 99 mg/dL Final  . BUN 35/38/7494 14  6 - 20 mg/dL Final  . Creatinine, Ser 06/26/2015 0.65  0.44 - 1.00 mg/dL Final  . Calcium 17/08/6332 9.4  8.9 - 10.3 mg/dL Final  . Total Protein 06/26/2015 7.6  6.5 - 8.1 g/dL Final  . Albumin 54/44/9982 4.0  3.5 - 5.0 g/dL Final  . AST 16/70/2773 23  15 - 41 U/L Final  . ALT 06/26/2015 17  14 - 54 U/L Final  . Alkaline Phosphatase 06/26/2015 70  38 - 126 U/L Final  . Total Bilirubin 06/26/2015 0.8  0.3 - 1.2 mg/dL Final  . GFR calc non Af Amer 06/26/2015 >60  >60 mL/min Final  . GFR calc Af Amer 06/26/2015 >60  >60 mL/min Final   Comment: (NOTE) The eGFR has been calculated using the CKD EPI equation. This calculation has not been validated in all clinical situations. eGFR's persistently <60 mL/min signify possible Chronic Kidney Disease.   . Anion gap 06/26/2015 8  5 - 15 Final  . Prothrombin Time 06/26/2015 13.1  11.6 - 15.2 seconds Final  . INR 06/26/2015 1.01  0.00 - 1.49 Final  . ABO/RH(D) 06/26/2015 A POS   Final  . Antibody Screen 06/26/2015 NEG   Final  . Sample Expiration 06/26/2015 07/07/2015   Final  . Extend sample reason 06/26/2015 NO TRANSFUSIONS OR PREGNANCY IN THE PAST 3 MONTHS   Final  . Color, Urine 06/26/2015 YELLOW  YELLOW Final  . APPearance 06/26/2015 CLEAR  CLEAR Final  . Specific Gravity, Urine 06/26/2015 1.012  1.005 - 1.030 Final  . pH 06/26/2015 6.5  5.0 - 8.0 Final  . Glucose, UA 06/26/2015 NEGATIVE  NEGATIVE mg/dL Final  . Hgb urine dipstick 06/26/2015 NEGATIVE  NEGATIVE Final  . Bilirubin Urine 06/26/2015 NEGATIVE  NEGATIVE Final  . Ketones, ur 06/26/2015 NEGATIVE  NEGATIVE mg/dL Final  . Protein, ur 81/30/8709 NEGATIVE  NEGATIVE mg/dL Final  . Nitrite 71/88/9143 NEGATIVE  NEGATIVE Final  . Leukocytes, UA 06/26/2015 NEGATIVE  NEGATIVE Final   MICROSCOPIC  NOT DONE ON URINES WITH NEGATIVE PROTEIN, BLOOD, LEUKOCYTES, NITRITE, OR GLUCOSE <1000 mg/dL.  Marland Kitchen MRSA, PCR 06/26/2015  NEGATIVE  NEGATIVE Final  . Staphylococcus aureus 06/26/2015 NEGATIVE  NEGATIVE Final   Comment:        The Xpert SA Assay (FDA approved for NASAL specimens in patients over 54 years of age), is one component of a comprehensive surveillance program.  Test performance has been validated by [facility] for patients greater than or equal to 75 year old. It is not intended to diagnose infection nor to guide or monitor treatment.   . ABO/RH(D) 06/26/2015 A POS   Final  Office Visit on 06/12/2015  Component Date Value Ref Range Status  . HM Diabetic Eye Exam 06/19/2014 No Retinopathy  No Retinopathy Final  . HM Diabetic Foot Exam 06/12/2015 done   Final  . Sodium 06/12/2015 138  135 - 145 mEq/L Final  . Potassium 06/12/2015 3.8  3.5 - 5.1 mEq/L Final  . Chloride 06/12/2015 101  96 - 112 mEq/L Final  . CO2 06/12/2015 30  19 - 32 mEq/L Final  . Glucose, Bld 06/12/2015 99  70 - 99 mg/dL Final  . BUN 06/12/2015 14  6 - 23 mg/dL Final  . Creatinine, Ser 06/12/2015 0.71  0.40 - 1.20 mg/dL Final  . Total Bilirubin 06/12/2015 0.6  0.2 - 1.2 mg/dL Final  . Alkaline Phosphatase 06/12/2015 68  39 - 117 U/L Final  . AST 06/12/2015 16  0 - 37 U/L Final  . ALT 06/12/2015 13  0 - 35 U/L Final  . Total Protein 06/12/2015 7.5  6.0 - 8.3 g/dL Final  . Albumin 06/12/2015 4.2  3.5 - 5.2 g/dL Final  . Calcium 06/12/2015 10.2  8.4 - 10.5 mg/dL Final  . GFR 06/12/2015 104.72  >60.00 mL/min Final  . Hgb A1c MFr Bld 06/12/2015 6.2  4.6 - 6.5 % Final   Glycemic Control Guidelines for People with Diabetes:Non Diabetic:  <6%Goal of Therapy: <7%Additional Action Suggested:  >8%   . Cholesterol 06/12/2015 212* 0 - 200 mg/dL Final   ATP III Classification       Desirable:  < 200 mg/dL               Borderline High:  200 - 239 mg/dL          High:  > = 240 mg/dL  . Triglycerides 06/12/2015 109.0  0.0 - 149.0 mg/dL Final   Normal:  <150 mg/dLBorderline High:  150 - 199 mg/dL  . HDL 06/12/2015 59.90  >39.00 mg/dL  Final  . VLDL 06/12/2015 21.8  0.0 - 40.0 mg/dL Final  . LDL Cholesterol 06/12/2015 130* 0 - 99 mg/dL Final  . Total CHOL/HDL Ratio 06/12/2015 4   Final                  Men          Women1/2 Average Risk     3.4          3.3Average Risk          5.0          4.42X Average Risk          9.6          7.13X Average Risk          15.0          11.0                      .  NonHDL 06/12/2015 152.21   Final   NOTE:  Non-HDL goal should be 30 mg/dL higher than patient's LDL goal (i.e. LDL goal of < 70 mg/dL, would have non-HDL goal of < 100 mg/dL)  . WBC 06/12/2015 8.7  4.0 - 10.5 K/uL Final  . RBC 06/12/2015 4.77  3.87 - 5.11 Mil/uL Final  . Hemoglobin 06/12/2015 13.3  12.0 - 15.0 g/dL Final  . HCT 06/12/2015 40.3  36.0 - 46.0 % Final  . MCV 06/12/2015 84.4  78.0 - 100.0 fl Final  . MCHC 06/12/2015 33.0  30.0 - 36.0 g/dL Final  . RDW 06/12/2015 13.8  11.5 - 15.5 % Final  . Platelets 06/12/2015 319.0  150.0 - 400.0 K/uL Final  . Neutrophils Relative % 06/12/2015 63.6  43.0 - 77.0 % Final  . Lymphocytes Relative 06/12/2015 26.9  12.0 - 46.0 % Final  . Monocytes Relative 06/12/2015 7.4  3.0 - 12.0 % Final  . Eosinophils Relative 06/12/2015 1.5  0.0 - 5.0 % Final  . Basophils Relative 06/12/2015 0.6  0.0 - 3.0 % Final  . Neutro Abs 06/12/2015 5.5  1.4 - 7.7 K/uL Final  . Lymphs Abs 06/12/2015 2.3  0.7 - 4.0 K/uL Final  . Monocytes Absolute 06/12/2015 0.6  0.1 - 1.0 K/uL Final  . Eosinophils Absolute 06/12/2015 0.1  0.0 - 0.7 K/uL Final  . Basophils Absolute 06/12/2015 0.1  0.0 - 0.1 K/uL Final     X-Rays:Mm Digital Screening Bilateral  06/27/2015  CLINICAL DATA:  Screening. EXAM: DIGITAL SCREENING BILATERAL MAMMOGRAM WITH CAD COMPARISON:  Previous exam(s). ACR Breast Density Category b: There are scattered areas of fibroglandular density. FINDINGS: In the right breast, a possible mass warrants further evaluation. In the left breast, no findings suspicious for malignancy. Images were processed with  CAD. IMPRESSION: Further evaluation is suggested for possible mass in the right breast. RECOMMENDATION: Diagnostic mammogram and possibly ultrasound of the right breast. (Code:[id number]) The patient will be contacted regarding the findings, and additional imaging will be scheduled. BI-RADS CATEGORY  0: Incomplete. Need additional imaging evaluation and/or prior mammograms for comparison. Electronically Signed   By: Lillia Mountain M.D.   On: 06/27/2015 13:31    EKG: Orders placed or performed in visit on 06/12/15  . EKG 12-Lead     Hospital Course: Melanie Lang is a 70 y.o. who was admitted to [facility]. They were brought to the operating room on 07/04/2015 and underwent Procedure(s): LEFT TOTAL KNEE REVISION.  Patient tolerated the procedure well and was later transferred to the recovery room and then to the orthopaedic floor for postoperative care.  They were given PO and IV analgesics for pain control following their surgery.  They were given 24 hours of postoperative antibiotics of  Anti-infectives    Start     Dose/Rate Route Frequency Ordered Stop   07/04/15 1500  ceFAZolin (ANCEF) IVPB 2g/100 mL premix     2 g 200 mL/hr over 30 Minutes Intravenous Every 6 hours 07/04/15 1138 07/04/15 2224   07/04/15 0612  ceFAZolin (ANCEF) IVPB 2g/100 mL premix     2 g 200 mL/hr over 30 Minutes Intravenous On call to O.R. 07/04/15 1025 07/04/15 0837     and started on DVT prophylaxis in the form of Xarelto.   PT and OT were ordered for total joint protocol.  Discharge planning consulted to help with postop disposition and equipment needs.  Patient had a good night on the evening of surgery.  They started  to get up OOB with therapy on day one, walking a significant amount. Hemovac drain was pulled without difficulty.  Continued to work with therapy into day two.  Dressing was changed on day two and the incision was clean and dry.  The patient had progressed with therapy and meeting their goals.   Incision was healing well.  Patient was seen in rounds and was ready to go home.   Diet: Cardiac diet and Diabetic diet Activity:WBAT Follow-up:in 2 weeks Disposition - Home Discharged Condition: stable   Discharge Instructions    Call MD / Call 911    Complete by:  As directed   If you experience chest pain or shortness of breath, CALL 911 and be transported to the hospital emergency room.  If you develope a fever above 101 F, pus (white drainage) or increased drainage or redness at the wound, or calf pain, call your surgeon's office.     Constipation Prevention    Complete by:  As directed   Drink plenty of fluids.  Prune juice may be helpful.  You may use a stool softener, such as Colace (over the counter) 100 mg twice a day.  Use MiraLax (over the counter) for constipation as needed.     Diet - low sodium heart healthy    Complete by:  As directed      Diet Carb Modified    Complete by:  As directed      Discharge instructions    Complete by:  As directed   Dr. Gaynelle Arabian Total Joint Specialist [facility] [address] [place], [place] [address] [phone]  TOTAL KNEE REPLACEMENT POSTOPERATIVE DIRECTIONS  Knee Rehabilitation, Guidelines Following Surgery  Results after knee surgery are often greatly improved when you follow the exercise, range of motion and muscle strengthening exercises prescribed by your doctor. Safety measures are also important to protect the knee from further injury. Any time any of these exercises cause you to have increased pain or swelling in your knee joint, decrease the amount until you are comfortable again and slowly increase them. If you have problems or questions, call your caregiver or physical therapist for advice.   HOME CARE INSTRUCTIONS  Remove items at home which could result in a fall. This includes throw rugs or furniture in walking pathways.  ICE to the affected knee every three hours for 30 minutes at a  time and then as needed for pain and swelling.  Continue to use ice on the knee for pain and swelling from surgery. You may notice swelling that will progress down to the foot and ankle.  This is normal after surgery.  Elevate the leg when you are not up walking on it.   Continue to use the breathing machine which will help keep your temperature down.  It is common for your temperature to cycle up and down following surgery, especially at night when you are not up moving around and exerting yourself.  The breathing machine keeps your lungs expanded and your temperature down. Do not place pillow under knee, focus on keeping the knee straight while resting  DIET You may resume your previous home diet once your are discharged from the hospital.  DRESSING / WOUND CARE / SHOWERING You may start showering once you are discharged home but do not submerge the incision under water. Just pat the incision dry and apply a dry gauze dressing on daily. Change the surgical dressing daily and reapply a dry dressing each time.  ACTIVITY Walk with your walker as instructed. Use walker as long as suggested by your caregivers. Avoid periods of inactivity such as sitting longer than an hour when not asleep. This helps prevent blood clots.  You may resume a sexual relationship in one month or when given the OK by your doctor.  You may return to work once you are cleared by your doctor.  Do not drive a car for 6 weeks or until released by you surgeon.  Do not drive while taking narcotics.  WEIGHT BEARING Weight bearing as tolerated with assist device (walker, cane, etc) as directed, use it as long as suggested by your surgeon or therapist, typically at least 4-6 weeks.  POSTOPERATIVE CONSTIPATION PROTOCOL Constipation - defined medically as fewer than three stools per week and severe constipation as less than one stool per week.  One of the most common issues patients have following surgery is constipation.  Even  if you have a regular bowel pattern at home, your normal regimen is likely to be disrupted due to multiple reasons following surgery.  Combination of anesthesia, postoperative narcotics, change in appetite and fluid intake all can affect your bowels.  In order to avoid complications following surgery, here are some recommendations in order to help you during your recovery period.  Colace (docusate) - Pick up an over-the-counter form of Colace or another stool softener and take twice a day as long as you are requiring postoperative pain medications.  Take with a full glass of water daily.  If you experience loose stools or diarrhea, hold the colace until you stool forms back up.  If your symptoms do not get better within 1 week or if they get worse, check with your doctor.  Dulcolax (bisacodyl) - Pick up over-the-counter and take as directed by the product packaging as needed to assist with the movement of your bowels.  Take with a full glass of water.  Use this product as needed if not relieved by Colace only.   MiraLax (polyethylene glycol) - Pick up over-the-counter to have on hand.  MiraLax is a solution that will increase the amount of water in your bowels to assist with bowel movements.  Take as directed and can mix with a glass of water, juice, soda, coffee, or tea.  Take if you go more than two days without a movement. Do not use MiraLax more than once per day. Call your doctor if you are still constipated or irregular after using this medication for 7 days in a row.  If you continue to have problems with postoperative constipation, please contact the office for further assistance and recommendations.  If you experience "the worst abdominal pain ever" or develop nausea or vomiting, please contact the office immediatly for further recommendations for treatment.  ITCHING  If you experience itching with your medications, try taking only a single pain pill, or even half a pain pill at a time.  You can  also use Benadryl over the counter for itching or also to help with sleep.   TED HOSE STOCKINGS Wear the elastic stockings on both legs for three weeks following surgery during the day but you may remove then at night for sleeping.  MEDICATIONS See your medication summary on the "After Visit Summary" that the nursing staff will review with you prior to discharge.  You may have some home medications which will be placed on hold until you complete the course of blood thinner medication.  It is important for you to complete  the blood thinner medication as prescribed by your surgeon.  Continue your approved medications as instructed at time of discharge.  PRECAUTIONS If you experience chest pain or shortness of breath - call 911 immediately for transfer to the hospital emergency department.  If you develop a fever greater that 101 F, purulent drainage from wound, increased redness or drainage from wound, foul odor from the wound/dressing, or calf pain - CONTACT YOUR SURGEON.                                                   FOLLOW-UP APPOINTMENTS Make sure you keep all of your appointments after your operation with your surgeon and caregivers. You should call the office at the above phone number and make an appointment for approximately two weeks after the date of your surgery or on the date instructed by your surgeon outlined in the "After Visit Summary".   RANGE OF MOTION AND STRENGTHENING EXERCISES  Rehabilitation of the knee is important following a knee injury or an operation. After just a few days of immobilization, the muscles of the thigh which control the knee become weakened and shrink (atrophy). Knee exercises are designed to build up the tone and strength of the thigh muscles and to improve knee motion. Often times heat used for twenty to thirty minutes before working out will loosen up your tissues and help with improving the range of motion but do not use heat for the first two weeks  following surgery. These exercises can be done on a training (exercise) mat, on the floor, on a table or on a bed. Use what ever works the best and is most comfortable for you Knee exercises include:  Leg Lifts - While your knee is still immobilized in a splint or cast, you can do straight leg raises. Lift the leg to 60 degrees, hold for 3 sec, and slowly lower the leg. Repeat 10-20 times 2-3 times daily. Perform this exercise against resistance later as your knee gets better.  Quad and Hamstring Sets - Tighten up the muscle on the front of the thigh (Quad) and hold for 5-10 sec. Repeat this 10-20 times hourly. Hamstring sets are done by pushing the foot backward against an object and holding for 5-10 sec. Repeat as with quad sets.  Leg Slides: Lying on your back, slowly slide your foot toward your buttocks, bending your knee up off the floor (only go as far as is comfortable). Then slowly slide your foot back down until your leg is flat on the floor again. [place]: Lying on your back spread your legs to the side as far apart as you can without causing discomfort.  A rehabilitation program following serious knee injuries can speed recovery and prevent re-injury in the future due to weakened muscles. Contact your doctor or a physical therapist for more information on knee rehabilitation.   IF YOU ARE TRANSFERRED TO A SKILLED REHAB FACILITY If the patient is transferred to a skilled rehab facility following release from the hospital, a list of the current medications will be sent to the facility for the patient to continue.  When discharged from the skilled rehab facility, please have the facility set up the patient's [place] prior to being released. Also, the skilled facility will be responsible for providing the patient with their medications at time  of release from the facility to include their pain medication, the muscle relaxants, and their blood thinner medication. If the patient  is still at the rehab facility at time of the two week follow up appointment, the skilled rehab facility will also need to assist the patient in arranging follow up appointment in our office and any transportation needs.  MAKE SURE YOU:  Understand these instructions.  Get help right away if you are not doing well or get worse.    Pick up stool softner and laxative for home use following surgery while on pain medications. Do not submerge incision under water. Please use good hand washing techniques while changing dressing each day. May shower starting three days after surgery. Please use a clean towel to pat the incision dry following showers. Continue to use ice for pain and swelling after surgery. Do not use any lotions or creams on the incision until instructed by your surgeon.     Increase activity slowly as tolerated    Complete by:  As directed             Medication List    STOP taking these medications        atorvastatin 20 MG tablet  Commonly known as:  LIPITOR     Vitamin D3 3000 units Tabs      TAKE these medications        amLODipine 10 MG tablet  Commonly known as:  NORVASC  Take 1 tablet (10 mg total) by mouth daily.     B-D SINGLE USE SWABS REGULAR Pads  Use to check blood sugar daily.  Dx: E11.9     COLCRYS 0.6 MG tablet  Generic drug:  colchicine  TAKE 1 TABLET BY MOUTH 2 TIMES DAILY.     furosemide 20 MG tablet  Commonly known as:  LASIX  TAKE 1 TABLET BY MOUTH DAILY AS NEEDED FOR FLUID     glucose blood test strip  True Metrix Test Strips.  Use to check blood sugar daily.  Dx: E11.9     lisinopril 40 MG tablet  Commonly known as:  PRINIVIL,ZESTRIL  Take 1 tablet (40 mg total) by mouth daily.     methocarbamol 500 MG tablet  Commonly known as:  ROBAXIN  Take 1 tablet (500 mg total) by mouth every 6 (six) hours as needed for muscle spasms.     OPCON-A OP  Place 1 drop into both eyes daily as needed (For allergies.).     oxyCODONE 5 MG  immediate release tablet  Commonly known as:  Oxy IR/ROXICODONE  Take 1-2 tablets (5-10 mg total) by mouth every 3 (three) hours as needed for breakthrough pain.     rivaroxaban 10 MG Tabs tablet  Commonly known as:  XARELTO  Take 1 tablet (10 mg total) by mouth daily with breakfast.     traMADol 50 MG tablet  Commonly known as:  ULTRAM  Take 1-2 tablets (50-100 mg total) by mouth every 6 (six) hours as needed for moderate pain.     TRUE METRIX AIR GLUCOSE METER Devi  1 each by Does not apply route daily. E11.9     TRUE METRIX LEVEL 1 Low Soln  Use as directed to check controls on glucose meter.  E11.9     TRUEPLUS LANCETS 33G Misc  Use to check blood sugar daily.  Dx: E11.9           Follow-up Information    Follow up with Loanne Drilling, MD.  Schedule an appointment as soon as possible for a visit on 07/17/2015.   Specialty:  Orthopedic Surgery   Contact information:   [address] [place] [address] [phone]       Follow up with [facility].   Why:  home health physical therapy   Contact information:   [place] [address] [phone]       Follow up with [facility].   Why:  3n1   Contact information:   [address] [place] [address] [phone]       Signed: Ardeen Jourdain, PA-C Orthopaedic Surgery 07/06/2015, 8:41 AM

## 2015-07-06 NOTE — Progress Notes (Signed)
Received a call from the nurse that patient now requesting a RW, order placed. Contacted [facility] to deliver to the room.

## 2015-07-06 NOTE — Progress Notes (Signed)
Physical Therapy Treatment Patient Details Name: Melanie Lang MRN: [id number] DOB: [birth year] Today's Date: 07/06/2015    History of Present Illness Pt is a 70 year old female s/p L total knee revision    PT Comments    Pt ambulated in hallway, practiced one step and performed LE exercises.  Pt feels ready for d/c home today.   Follow Up Recommendations  Home health PT     Equipment Recommendations  Rolling walker with 5" wheels    Recommendations for Other Services       Precautions / Restrictions Precautions Precautions: Knee Precaution Comments: able to perform SLR Required Braces or Orthoses: Knee Immobilizer - Left Restrictions Weight Bearing Restrictions: No Other Position/Activity Restrictions: WBAT    Mobility  Bed Mobility               General bed mobility comments: pt up in recliner on arrival  Transfers Overall transfer level: Needs assistance Equipment used: Rolling walker (2 wheeled) Transfers: Sit to/from Stand Sit to Stand: Supervision         General transfer comment: verbal cues for UE and LE positioning  Ambulation/Gait Ambulation/Gait assistance: Supervision Ambulation Distance (Feet): 180 Feet Assistive device: Rolling walker (2 wheeled) Gait Pattern/deviations: Step-through pattern;Decreased stance time - left;Antalgic     General Gait Details: verbal cues for heel strike, RW distance, step length   Stairs Stairs: Yes Stairs assistance: Min guard Stair Management: Step to pattern;Forwards;With walker Number of Stairs: 1 General stair comments: verbal cues for sequence and safety  Wheelchair Mobility    Modified Rankin (Stroke Patients Only)       Balance                                    Cognition Arousal/Alertness: Awake/alert Behavior During Therapy: WFL for tasks assessed/performed Overall Cognitive Status: Within Functional Limits for tasks assessed                       Exercises Total Joint Exercises Ankle Circles/Pumps: AROM;10 reps;Both Quad Sets: AROM;Both;10 reps Short Arc Quad: AROM;10 reps;Left Heel Slides: AAROM;Left;10 reps;Seated Hip ABduction/ADduction: AAROM;Left;10 reps Straight Leg Raises: AROM;Left;10 reps    General Comments        Pertinent Vitals/Pain Pain Assessment: 0-10 Pain Score: 3  Pain Location: L knee Pain Descriptors / Indicators: Sore;Aching Pain Intervention(s): Monitored during session;Limited activity within patient's tolerance;Repositioned;Ice applied    Home Living Family/patient expects to be discharged to:: Private residence Living Arrangements: Spouse/significant other   Type of Home: House Home Access: Stairs to enter   Home Layout: One level Home Equipment: None      Prior Function Level of Independence: Independent          PT Goals (current goals can now be found in the care plan section) Acute Rehab PT Goals Patient Stated Goal: home today Progress towards PT goals: Progressing toward goals    Frequency  7X/week    PT Plan Current plan remains appropriate    Co-evaluation             End of Session   Activity Tolerance: Patient tolerated treatment well Patient left: with call bell/phone within reach;in chair;with family/visitor present     Time: [id number] PT Time Calculation (min) (ACUTE ONLY): 25 min  Charges:  $Gait Training: 8-22 mins $Therapeutic Exercise: 8-22 mins  G Codes:      Kolbie Lepkowski,[name] Jul 19, 2015, 12:07 PM Carmelia Bake, PT, DPT Jul 19, 2015 Pager: [phone]

## 2015-07-06 NOTE — Evaluation (Signed)
Occupational Therapy Evaluation Patient Details Name: KAHMIYAH FRANCE MRN: [id number] DOB: 1945/01/22 Today's Date: 2015/07/21    History of Present Illness Pt is a 70 year old female s/p L total knee revision   Clinical Impression   OT education complete regarding ADL activity s/p TKR    Follow Up Recommendations  No OT follow up    Equipment Recommendations  None recommended by OT    Recommendations for Other Services       Precautions / Restrictions Precautions Precautions: Knee Required Braces or Orthoses: Knee Immobilizer - Left Restrictions Weight Bearing Restrictions: No Other Position/Activity Restrictions: WBAT      Mobility Bed Mobility                  Transfers Overall transfer level: Needs assistance Equipment used: Rolling walker (2 wheeled) Transfers: Sit to/from Stand;Stand Pivot Transfers Sit to Stand: Supervision         General transfer comment: verbal cues for UE and LE positioning         ADL Overall ADL's : Needs assistance/impaired                     Lower Body Dressing: Minimal assistance;Sit to/from stand;Cueing for sequencing;Cueing for compensatory techniques;Cueing for safety   Toilet Transfer: Supervision/safety;RW;Comfort height toilet   Toileting- Clothing Manipulation and Hygiene: Supervision/safety;Sit to/from stand;Cueing for sequencing   Tub/ Shower Transfer: Walk-in shower;Min guard;Cueing for safety;Cueing for sequencing                     Pertinent Vitals/Pain Pain Score: 2  Pain Descriptors / Indicators: Sore Pain Intervention(s): Monitored during session;Repositioned;Ice applied     Hand Dominance     Extremity/Trunk Assessment Upper Extremity Assessment Upper Extremity Assessment: Overall WFL for tasks assessed           Communication Communication Communication: No difficulties   Cognition Arousal/Alertness: Awake/alert Behavior During Therapy: WFL for tasks  assessed/performed Overall Cognitive Status: Within Functional Limits for tasks assessed                     General Comments               Home Living Family/patient expects to be discharged to:: Private residence Living Arrangements: Spouse/significant other   Type of Home: House Home Access: Stairs to enter Technical brewer of Steps: 1   Home Layout: One level     Bathroom Shower/Tub: Chief Strategy Officer: None          Prior Functioning/Environment Level of Independence: Independent                      OT Goals(Current goals can be found in the care plan section) Acute Rehab OT Goals Patient Stated Goal: home today OT Goal Formulation: With patient Time For Goal Achievement: 07/20/15 Potential to Achieve Goals: Good  OT Frequency:     Barriers to D/C:               End of Session Equipment Utilized During Treatment: Rolling walker CPM Left Knee CPM Left Knee: Off  Activity Tolerance: Patient tolerated treatment well Patient left:  in chair with husband present   Time: [id number] OT Time Calculation (min): 10 min Charges:  OT General Charges $OT Visit: 1 Procedure OT Evaluation $OT Eval Low Complexity: 1 Procedure G-Codes:    Betsy Pries 2015/07/21, 11:21  AM    

## 2015-07-06 NOTE — Progress Notes (Signed)
   Subjective: 2 Days Post-Op Procedure(s) (LRB): LEFT TOTAL KNEE REVISION (Left) Patient reports pain as moderate.   Plan is to go Home after hospital stay.  Objective: Vital signs in last 24 hours: Temp:  [97.7 F (36.5 C)-99.4 F (37.4 C)] 99.4 F (37.4 C) (06/16 0606) Pulse Rate:  [70-85] 84 (06/16 0606) Resp:  [14-16] 15 (06/16 0606) BP: (109-158)/(64-89) 124/89 mmHg (06/16 0606) SpO2:  [96 %-98 %] 98 % (06/16 0606)  Intake/Output from previous day:  Intake/Output Summary (Last 24 hours) at 07/06/15 0732 Last data filed at 07/06/15 0606  Gross per 24 hour  Intake   1105 ml  Output   4300 ml  Net  -3195 ml    Intake/Output this shift:    Labs:  Recent Labs  07/05/15 0438 07/06/15 0354  HGB 12.0 12.0    Recent Labs  07/05/15 0438 07/06/15 0354  WBC 15.4* 15.1*  RBC 4.28 4.30  HCT 36.5 36.6  PLT 267 251    Recent Labs  07/05/15 0438 07/06/15 0354  NA 137 138  K 3.9 3.6  CL 103 102  CO2 27 30  BUN 12 12  CREATININE 0.71 0.65  GLUCOSE 126* 124*  CALCIUM 9.1 9.3   No results for input(s): LABPT, INR in the last 72 hours.  EXAM General - Patient is Alert, Appropriate and Oriented Extremity - Neurologically intact Neurovascular intact No cellulitis present Compartment soft Dressing/Incision - clean, dry, no drainage Motor Function - intact, moving foot and toes well on exam.   Past Medical History  Diagnosis Date  . Arthritis   . Asthma   . Allergy   . Hyperlipidemia   . Hypertension   . Diabetes mellitus without complication ([facility])     borderline controlled with diet   . 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

## 2015-07-09 DIAGNOSIS — I10 Essential (primary) hypertension: Secondary | ICD-10-CM | POA: Diagnosis not present

## 2015-07-09 DIAGNOSIS — Z471 Aftercare following joint replacement surgery: Secondary | ICD-10-CM | POA: Diagnosis not present

## 2015-07-09 DIAGNOSIS — E119 Type 2 diabetes mellitus without complications: Secondary | ICD-10-CM | POA: Diagnosis not present

## 2015-07-09 DIAGNOSIS — Z96652 Presence of left artificial knee joint: Secondary | ICD-10-CM | POA: Diagnosis not present

## 2015-07-09 DIAGNOSIS — J45909 Unspecified asthma, uncomplicated: Secondary | ICD-10-CM | POA: Diagnosis not present

## 2015-07-09 DIAGNOSIS — K219 Gastro-esophageal reflux disease without esophagitis: Secondary | ICD-10-CM | POA: Diagnosis not present

## 2015-07-11 DIAGNOSIS — J45909 Unspecified asthma, uncomplicated: Secondary | ICD-10-CM | POA: Diagnosis not present

## 2015-07-11 DIAGNOSIS — Z96652 Presence of left artificial knee joint: Secondary | ICD-10-CM | POA: Diagnosis not present

## 2015-07-11 DIAGNOSIS — I10 Essential (primary) hypertension: Secondary | ICD-10-CM | POA: Diagnosis not present

## 2015-07-11 DIAGNOSIS — K219 Gastro-esophageal reflux disease without esophagitis: Secondary | ICD-10-CM | POA: Diagnosis not present

## 2015-07-11 DIAGNOSIS — Z471 Aftercare following joint replacement surgery: Secondary | ICD-10-CM | POA: Diagnosis not present

## 2015-07-11 DIAGNOSIS — E119 Type 2 diabetes mellitus without complications: Secondary | ICD-10-CM | POA: Diagnosis not present

## 2015-07-11 LAB — GLUCOSE, CAPILLARY: Glucose-Capillary: 160 mg/dL — ABNORMAL HIGH (ref 65–99)

## 2015-07-13 DIAGNOSIS — J45909 Unspecified asthma, uncomplicated: Secondary | ICD-10-CM | POA: Diagnosis not present

## 2015-07-13 DIAGNOSIS — Z471 Aftercare following joint replacement surgery: Secondary | ICD-10-CM | POA: Diagnosis not present

## 2015-07-13 DIAGNOSIS — E119 Type 2 diabetes mellitus without complications: Secondary | ICD-10-CM | POA: Diagnosis not present

## 2015-07-13 DIAGNOSIS — Z96652 Presence of left artificial knee joint: Secondary | ICD-10-CM | POA: Diagnosis not present

## 2015-07-13 DIAGNOSIS — I10 Essential (primary) hypertension: Secondary | ICD-10-CM | POA: Diagnosis not present

## 2015-07-13 DIAGNOSIS — K219 Gastro-esophageal reflux disease without esophagitis: Secondary | ICD-10-CM | POA: Diagnosis not present

## 2015-07-16 DIAGNOSIS — Z96652 Presence of left artificial knee joint: Secondary | ICD-10-CM | POA: Diagnosis not present

## 2015-07-16 DIAGNOSIS — E119 Type 2 diabetes mellitus without complications: Secondary | ICD-10-CM | POA: Diagnosis not present

## 2015-07-16 DIAGNOSIS — J45909 Unspecified asthma, uncomplicated: Secondary | ICD-10-CM | POA: Diagnosis not present

## 2015-07-16 DIAGNOSIS — Z471 Aftercare following joint replacement surgery: Secondary | ICD-10-CM | POA: Diagnosis not present

## 2015-07-16 DIAGNOSIS — K219 Gastro-esophageal reflux disease without esophagitis: Secondary | ICD-10-CM | POA: Diagnosis not present

## 2015-07-16 DIAGNOSIS — I10 Essential (primary) hypertension: Secondary | ICD-10-CM | POA: Diagnosis not present

## 2015-07-18 DIAGNOSIS — Z471 Aftercare following joint replacement surgery: Secondary | ICD-10-CM | POA: Diagnosis not present

## 2015-07-18 DIAGNOSIS — J45909 Unspecified asthma, uncomplicated: Secondary | ICD-10-CM | POA: Diagnosis not present

## 2015-07-18 DIAGNOSIS — I10 Essential (primary) hypertension: Secondary | ICD-10-CM | POA: Diagnosis not present

## 2015-07-18 DIAGNOSIS — K219 Gastro-esophageal reflux disease without esophagitis: Secondary | ICD-10-CM | POA: Diagnosis not present

## 2015-07-18 DIAGNOSIS — E119 Type 2 diabetes mellitus without complications: Secondary | ICD-10-CM | POA: Diagnosis not present

## 2015-07-18 DIAGNOSIS — Z96652 Presence of left artificial knee joint: Secondary | ICD-10-CM | POA: Diagnosis not present

## 2015-07-20 DIAGNOSIS — Z96652 Presence of left artificial knee joint: Secondary | ICD-10-CM | POA: Diagnosis not present

## 2015-07-20 DIAGNOSIS — E119 Type 2 diabetes mellitus without complications: Secondary | ICD-10-CM | POA: Diagnosis not present

## 2015-07-20 DIAGNOSIS — Z471 Aftercare following joint replacement surgery: Secondary | ICD-10-CM | POA: Diagnosis not present

## 2015-07-20 DIAGNOSIS — J45909 Unspecified asthma, uncomplicated: Secondary | ICD-10-CM | POA: Diagnosis not present

## 2015-07-20 DIAGNOSIS — K219 Gastro-esophageal reflux disease without esophagitis: Secondary | ICD-10-CM | POA: Diagnosis not present

## 2015-07-20 DIAGNOSIS — I10 Essential (primary) hypertension: Secondary | ICD-10-CM | POA: Diagnosis not present

## 2015-07-23 DIAGNOSIS — E119 Type 2 diabetes mellitus without complications: Secondary | ICD-10-CM | POA: Diagnosis not present

## 2015-07-23 DIAGNOSIS — Z96652 Presence of left artificial knee joint: Secondary | ICD-10-CM | POA: Diagnosis not present

## 2015-07-23 DIAGNOSIS — Z471 Aftercare following joint replacement surgery: Secondary | ICD-10-CM | POA: Diagnosis not present

## 2015-07-23 DIAGNOSIS — K219 Gastro-esophageal reflux disease without esophagitis: Secondary | ICD-10-CM | POA: Diagnosis not present

## 2015-07-23 DIAGNOSIS — J45909 Unspecified asthma, uncomplicated: Secondary | ICD-10-CM | POA: Diagnosis not present

## 2015-07-23 DIAGNOSIS — I10 Essential (primary) hypertension: Secondary | ICD-10-CM | POA: Diagnosis not present

## 2015-07-25 DIAGNOSIS — J45909 Unspecified asthma, uncomplicated: Secondary | ICD-10-CM | POA: Diagnosis not present

## 2015-07-25 DIAGNOSIS — K219 Gastro-esophageal reflux disease without esophagitis: Secondary | ICD-10-CM | POA: Diagnosis not present

## 2015-07-25 DIAGNOSIS — Z96652 Presence of left artificial knee joint: Secondary | ICD-10-CM | POA: Diagnosis not present

## 2015-07-25 DIAGNOSIS — I10 Essential (primary) hypertension: Secondary | ICD-10-CM | POA: Diagnosis not present

## 2015-07-25 DIAGNOSIS — Z471 Aftercare following joint replacement surgery: Secondary | ICD-10-CM | POA: Diagnosis not present

## 2015-07-25 DIAGNOSIS — E119 Type 2 diabetes mellitus without complications: Secondary | ICD-10-CM | POA: Diagnosis not present

## 2015-07-27 DIAGNOSIS — Z471 Aftercare following joint replacement surgery: Secondary | ICD-10-CM | POA: Diagnosis not present

## 2015-07-27 DIAGNOSIS — K219 Gastro-esophageal reflux disease without esophagitis: Secondary | ICD-10-CM | POA: Diagnosis not present

## 2015-07-27 DIAGNOSIS — I10 Essential (primary) hypertension: Secondary | ICD-10-CM | POA: Diagnosis not present

## 2015-07-27 DIAGNOSIS — J45909 Unspecified asthma, uncomplicated: Secondary | ICD-10-CM | POA: Diagnosis not present

## 2015-07-27 DIAGNOSIS — E119 Type 2 diabetes mellitus without complications: Secondary | ICD-10-CM | POA: Diagnosis not present

## 2015-07-27 DIAGNOSIS — Z96652 Presence of left artificial knee joint: Secondary | ICD-10-CM | POA: Diagnosis not present

## 2015-07-31 DIAGNOSIS — Z96659 Presence of unspecified artificial knee joint: Secondary | ICD-10-CM | POA: Diagnosis not present

## 2015-07-31 DIAGNOSIS — T84018D Broken internal joint prosthesis, other site, subsequent encounter: Secondary | ICD-10-CM | POA: Diagnosis not present

## 2015-08-01 DIAGNOSIS — Z96659 Presence of unspecified artificial knee joint: Secondary | ICD-10-CM | POA: Diagnosis not present

## 2015-08-01 DIAGNOSIS — T84018D Broken internal joint prosthesis, other site, subsequent encounter: Secondary | ICD-10-CM | POA: Diagnosis not present

## 2015-08-03 DIAGNOSIS — Z96652 Presence of left artificial knee joint: Secondary | ICD-10-CM | POA: Diagnosis not present

## 2015-08-03 DIAGNOSIS — T84018D Broken internal joint prosthesis, other site, subsequent encounter: Secondary | ICD-10-CM | POA: Diagnosis not present

## 2015-08-03 DIAGNOSIS — Z471 Aftercare following joint replacement surgery: Secondary | ICD-10-CM | POA: Diagnosis not present

## 2015-08-06 DIAGNOSIS — T84018D Broken internal joint prosthesis, other site, subsequent encounter: Secondary | ICD-10-CM | POA: Diagnosis not present

## 2015-08-06 DIAGNOSIS — Z96652 Presence of left artificial knee joint: Secondary | ICD-10-CM | POA: Diagnosis not present

## 2015-08-06 DIAGNOSIS — Z471 Aftercare following joint replacement surgery: Secondary | ICD-10-CM | POA: Diagnosis not present

## 2015-08-07 DIAGNOSIS — Z96652 Presence of left artificial knee joint: Secondary | ICD-10-CM | POA: Diagnosis not present

## 2015-08-07 DIAGNOSIS — Z471 Aftercare following joint replacement surgery: Secondary | ICD-10-CM | POA: Diagnosis not present

## 2015-08-08 DIAGNOSIS — Z471 Aftercare following joint replacement surgery: Secondary | ICD-10-CM | POA: Diagnosis not present

## 2015-08-08 DIAGNOSIS — T84018D Broken internal joint prosthesis, other site, subsequent encounter: Secondary | ICD-10-CM | POA: Diagnosis not present

## 2015-08-08 DIAGNOSIS — Z96652 Presence of left artificial knee joint: Secondary | ICD-10-CM | POA: Diagnosis not present

## 2015-08-10 DIAGNOSIS — Z471 Aftercare following joint replacement surgery: Secondary | ICD-10-CM | POA: Diagnosis not present

## 2015-08-10 DIAGNOSIS — T84018D Broken internal joint prosthesis, other site, subsequent encounter: Secondary | ICD-10-CM | POA: Diagnosis not present

## 2015-08-10 DIAGNOSIS — Z96652 Presence of left artificial knee joint: Secondary | ICD-10-CM | POA: Diagnosis not present

## 2015-08-13 DIAGNOSIS — Z96652 Presence of left artificial knee joint: Secondary | ICD-10-CM | POA: Diagnosis not present

## 2015-08-13 DIAGNOSIS — Z96659 Presence of unspecified artificial knee joint: Secondary | ICD-10-CM | POA: Diagnosis not present

## 2015-08-13 DIAGNOSIS — T84018D Broken internal joint prosthesis, other site, subsequent encounter: Secondary | ICD-10-CM | POA: Diagnosis not present

## 2015-08-14 ENCOUNTER — Ambulatory Visit
Admission: RE | Admit: 2015-08-14 | Discharge: 2015-08-14 | Disposition: A | Discharge status: Home / Self Care | Payer: Commercial Managed Care - HMO | Source: Ambulatory Visit | Attending: Family Medicine | Admitting: Family Medicine

## 2015-08-14 ENCOUNTER — Other Ambulatory Visit: Payer: Self-pay | Admitting: Family Medicine

## 2015-08-14 DIAGNOSIS — R921 Mammographic calcification found on diagnostic imaging of breast: Secondary | ICD-10-CM | POA: Diagnosis not present

## 2015-08-14 DIAGNOSIS — N63 Unspecified lump in breast: Secondary | ICD-10-CM | POA: Diagnosis not present

## 2015-08-14 DIAGNOSIS — N642 Atrophy of breast: Secondary | ICD-10-CM | POA: Diagnosis not present

## 2015-08-14 DIAGNOSIS — R928 Other abnormal and inconclusive findings on diagnostic imaging of breast: Secondary | ICD-10-CM

## 2015-08-15 ENCOUNTER — Telehealth: Payer: Self-pay | Admitting: Family Medicine

## 2015-08-15 DIAGNOSIS — T84018D Broken internal joint prosthesis, other site, subsequent encounter: Secondary | ICD-10-CM | POA: Diagnosis not present

## 2015-08-15 DIAGNOSIS — E119 Type 2 diabetes mellitus without complications: Secondary | ICD-10-CM

## 2015-08-15 DIAGNOSIS — E785 Hyperlipidemia, unspecified: Secondary | ICD-10-CM

## 2015-08-15 NOTE — Telephone Encounter (Signed)
-----   Message from Ellamae Sia sent at 08/08/2015  3:11 PM EDT ----- Regarding: Lab orders for Thursday, 7.27.17 Lab orders for a 6 month follow up appt

## 2015-08-16 ENCOUNTER — Other Ambulatory Visit (INDEPENDENT_AMBULATORY_CARE_PROVIDER_SITE_OTHER): Payer: Commercial Managed Care - HMO

## 2015-08-16 ENCOUNTER — Telehealth: Payer: Self-pay | Admitting: Family Medicine

## 2015-08-16 DIAGNOSIS — E119 Type 2 diabetes mellitus without complications: Secondary | ICD-10-CM | POA: Diagnosis not present

## 2015-08-16 DIAGNOSIS — E785 Hyperlipidemia, unspecified: Secondary | ICD-10-CM | POA: Diagnosis not present

## 2015-08-16 LAB — COMPREHENSIVE METABOLIC PANEL
ALBUMIN: 4 g/dL (ref 3.5–5.2)
ALK PHOS: 76 U/L (ref 39–117)
ALT: 9 U/L (ref 0–35)
AST: 14 U/L (ref 0–37)
BUN: 13 mg/dL (ref 6–23)
CALCIUM: 10 mg/dL (ref 8.4–10.5)
CO2: 31 mEq/L (ref 19–32)
Chloride: 101 mEq/L (ref 96–112)
Creatinine, Ser: 0.74 mg/dL (ref 0.40–1.20)
GFR: 99.78 mL/min (ref >60.00)
Glucose, Bld: 102 mg/dL — ABNORMAL HIGH (ref 70–99)
POTASSIUM: 3.8 meq/L (ref 3.5–5.1)
SODIUM: 139 meq/L (ref 135–145)
TOTAL PROTEIN: 7.7 g/dL (ref 6.0–8.3)
Total Bilirubin: 0.6 mg/dL (ref 0.2–1.2)

## 2015-08-16 LAB — LIPID PANEL
CHOLESTEROL: 199 mg/dL (ref 0–200)
HDL: 63.3 mg/dL (ref >39.00)
LDL Cholesterol: 120 mg/dL — ABNORMAL HIGH (ref 0–99)
NONHDL: 135.6
Total CHOL/HDL Ratio: 3
Triglycerides: 77 mg/dL (ref 0.0–149.0)
VLDL: 15.4 mg/dL (ref 0.0–40.0)

## 2015-08-16 LAB — HEMOGLOBIN A1C: HEMOGLOBIN A1C: 5.4 % (ref 4.6–6.5)

## 2015-08-16 NOTE — Telephone Encounter (Signed)
-----   Message from Marchia Bond sent at 08/16/2015  8:21 AM EDT ----- Regarding: Pt wants uric acid added to today's labs Pt has been having gout flare ups recently and wants to know if uric acid can be added to today's labs. Is this ok?  Thanks Aniceto Boss

## 2015-08-16 NOTE — Telephone Encounter (Signed)
Okay to add uric acid

## 2015-08-17 DIAGNOSIS — T84018D Broken internal joint prosthesis, other site, subsequent encounter: Secondary | ICD-10-CM | POA: Diagnosis not present

## 2015-08-20 DIAGNOSIS — T84018D Broken internal joint prosthesis, other site, subsequent encounter: Secondary | ICD-10-CM | POA: Diagnosis not present

## 2015-08-21 ENCOUNTER — Encounter: Payer: Self-pay | Admitting: Family Medicine

## 2015-08-21 ENCOUNTER — Ambulatory Visit (INDEPENDENT_AMBULATORY_CARE_PROVIDER_SITE_OTHER): Payer: Commercial Managed Care - HMO | Admitting: Family Medicine

## 2015-08-21 VITALS — BP 140/82 | HR 88 | Temp 98.3°F | Ht 65.0 in | Wt 190.8 lb

## 2015-08-21 DIAGNOSIS — E785 Hyperlipidemia, unspecified: Secondary | ICD-10-CM | POA: Diagnosis not present

## 2015-08-21 DIAGNOSIS — E119 Type 2 diabetes mellitus without complications: Secondary | ICD-10-CM | POA: Diagnosis not present

## 2015-08-21 DIAGNOSIS — I10 Essential (primary) hypertension: Secondary | ICD-10-CM | POA: Diagnosis not present

## 2015-08-21 LAB — HM DIABETES FOOT EXAM

## 2015-08-21 NOTE — Progress Notes (Signed)
Subjective:    Patient ID: Melanie Lang, female    DOB: [birth year], 70 y.o.   MRN: [id number]  HPI   Patient presents for 6 months DM follow up.  She recently had a left  total knee replacement in 06/2015.  She is doing well overall on PT.    Diabetes:  Very well controlled with diet control. Lab Results  Component Value Date   HGBA1C 5.4 08/16/2015  Using medications without difficulties: Hypoglycemic episodes:none Hyperglycemic episodes:none Feet problems: no ulcer, numbness in left toes is resolved S/P knee replacment Blood Sugars averaging: 109 eye exam within last year: Due  Elevated Cholesterol:.  LDL not at goal < 100 on no medication.  She has been told to stay of statin ( atorvastatin) while on colchicine for gout.  No uric acid.  Lab Results  Component Value Date   CHOL 199 08/16/2015   HDL 63.30 08/16/2015   LDLCALC 120 (H) 08/16/2015   LDLDIRECT 132.7 12/07/2012   TRIG 77.0 08/16/2015   CHOLHDL 3 08/16/2015  Using medications without problems:None Muscle aches: None Diet compliance: Good Exercise: PT Other complaints:  Hypertension:   Elevated BP today, at home BP 125/79 On lisinopril and amlodipine.  She  Rushed here today. BP Readings from Last 3 Encounters:  08/21/15 140/82  07/06/15 134/69  06/26/15 (!) 177/90  Using medication without problems or lightheadedness:  Chest pain with exertion: none Edema: mild left ankle swelling. She has not needed lasix recently. Short of breath: None Average home BPs: 120/70s Other issues:  She has lost some weight with decreased appetite. Wt Readings from Last 3 Encounters:  08/21/15 190 lb 12 oz (86.5 kg)  07/04/15 200 lb (90.7 kg)  06/26/15 200 lb (90.7 kg)        Review of Systems  Constitutional: Negative for fatigue and fever.  HENT: Negative for ear pain.   Eyes: Negative for pain.  Respiratory: Negative for chest tightness and shortness of breath.   Cardiovascular: Negative for chest  pain, palpitations and leg swelling.  Gastrointestinal: Negative for abdominal pain.  Genitourinary: Negative for dysuria.       Objective:   Physical Exam  Constitutional: Vital signs are normal. She appears well-developed and well-nourished. She is cooperative.  Non-toxic appearance. She does not appear ill. No distress.  HENT:  Head: Normocephalic.  Right Ear: Hearing, tympanic membrane, external ear and ear canal normal. Tympanic membrane is not erythematous, not retracted and not bulging.  Left Ear: Hearing, tympanic membrane, external ear and ear canal normal. Tympanic membrane is not erythematous, not retracted and not bulging.  Nose: No mucosal edema or rhinorrhea. Right sinus exhibits no maxillary sinus tenderness and no frontal sinus tenderness. Left sinus exhibits no maxillary sinus tenderness and no frontal sinus tenderness.  Mouth/Throat: Uvula is midline, oropharynx is clear and moist and mucous membranes are normal.  Eyes: Conjunctivae, EOM and lids are normal. Pupils are equal, round, and reactive to light. Lids are everted and swept, no foreign bodies found.  Neck: Trachea normal and normal range of motion. Neck supple. Carotid bruit is not present. No thyroid mass and no thyromegaly present.  Cardiovascular: Normal rate, regular rhythm, S1 normal, S2 normal, normal heart sounds, intact distal pulses and normal pulses.  Exam reveals no gallop and no friction rub.   No murmur heard. Pulmonary/Chest: Effort normal and breath sounds normal. No tachypnea. No respiratory distress. She has no decreased breath sounds. She has no wheezes. She has no rhonchi. She  has no rales.  Abdominal: Soft. Normal appearance and bowel sounds are normal. There is no tenderness.  Neurological: She is alert.  Skin: Skin is warm, dry and intact. No rash noted.  Psychiatric: Her speech is normal and behavior is normal. Judgment and thought content normal. Her mood appears not anxious. Cognition and  memory are normal. She does not exhibit a depressed mood.      Diabetic foot exam: Normal inspection except bunions and cross over toes No skin breakdown Moderate calluses  Normal DP pulses Normal sensation to light touch and monofilament Nails thickened     Assessment & Plan:

## 2015-08-21 NOTE — Assessment & Plan Note (Signed)
Well-controlled at home on current regimen

## 2015-08-21 NOTE — Patient Instructions (Addendum)
Restart atorvastain 20 mg daily for cholesterol control. Keep working on healthy eating , low carb diet, exercise as tolerated. Schedule yearly eye exam with eye MD.

## 2015-08-21 NOTE — Assessment & Plan Note (Signed)
Restart atorvastatin now that off colchicine.  Recheck in 6 months.

## 2015-08-21 NOTE — Assessment & Plan Note (Signed)
Excellent control on no med. Encouraged exercise, weight loss, healthy eating habits.

## 2015-08-21 NOTE — Progress Notes (Signed)
Pre visit review using our clinic review tool, if applicable. No additional management support is needed unless otherwise documented below in the visit note. 

## 2015-08-22 DIAGNOSIS — T84018D Broken internal joint prosthesis, other site, subsequent encounter: Secondary | ICD-10-CM | POA: Diagnosis not present

## 2015-08-27 DIAGNOSIS — T84018D Broken internal joint prosthesis, other site, subsequent encounter: Secondary | ICD-10-CM | POA: Diagnosis not present

## 2015-09-22 ENCOUNTER — Other Ambulatory Visit: Payer: Self-pay | Admitting: Family Medicine

## 2015-10-12 ENCOUNTER — Other Ambulatory Visit: Payer: Self-pay | Admitting: Pharmacist

## 2015-10-12 NOTE — Patient Outreach (Signed)
Outreach call to Melanie Lang regarding her request for follow up from the [facility] Medication Adherence Campaign. Left a HIPAA compliant message on the patient's voicemail.  Harlow Asa, PharmD Clinical Pharmacist [facility] [phone]

## 2015-11-05 ENCOUNTER — Other Ambulatory Visit: Payer: Self-pay | Admitting: Family Medicine

## 2015-11-21 LAB — HM DIABETES EYE EXAM

## 2015-12-03 DIAGNOSIS — E119 Type 2 diabetes mellitus without complications: Secondary | ICD-10-CM | POA: Diagnosis not present

## 2015-12-03 LAB — HM DIABETES EYE EXAM

## 2015-12-04 DIAGNOSIS — Z0100 Encounter for examination of eyes and vision without abnormal findings: Secondary | ICD-10-CM | POA: Diagnosis not present

## 2016-01-29 DIAGNOSIS — Z96652 Presence of left artificial knee joint: Secondary | ICD-10-CM | POA: Diagnosis not present

## 2016-01-29 DIAGNOSIS — Z471 Aftercare following joint replacement surgery: Secondary | ICD-10-CM | POA: Diagnosis not present

## 2016-02-21 ENCOUNTER — Ambulatory Visit (INDEPENDENT_AMBULATORY_CARE_PROVIDER_SITE_OTHER): Payer: Medicare HMO

## 2016-02-21 ENCOUNTER — Other Ambulatory Visit: Payer: Commercial Managed Care - HMO

## 2016-02-21 VITALS — BP 150/90 | HR 74 | Temp 98.5°F | Ht 64.0 in | Wt 195.5 lb

## 2016-02-21 DIAGNOSIS — E119 Type 2 diabetes mellitus without complications: Secondary | ICD-10-CM

## 2016-02-21 DIAGNOSIS — E2839 Other primary ovarian failure: Secondary | ICD-10-CM

## 2016-02-21 DIAGNOSIS — I10 Essential (primary) hypertension: Secondary | ICD-10-CM | POA: Diagnosis not present

## 2016-02-21 DIAGNOSIS — M1A09X0 Idiopathic chronic gout, multiple sites, without tophus (tophi): Secondary | ICD-10-CM | POA: Diagnosis not present

## 2016-02-21 DIAGNOSIS — D72828 Other elevated white blood cell count: Secondary | ICD-10-CM

## 2016-02-21 DIAGNOSIS — E784 Other hyperlipidemia: Secondary | ICD-10-CM

## 2016-02-21 DIAGNOSIS — E7849 Other hyperlipidemia: Secondary | ICD-10-CM

## 2016-02-21 DIAGNOSIS — E559 Vitamin D deficiency, unspecified: Secondary | ICD-10-CM | POA: Diagnosis not present

## 2016-02-21 DIAGNOSIS — M8580 Other specified disorders of bone density and structure, unspecified site: Secondary | ICD-10-CM

## 2016-02-21 LAB — CBC WITH DIFFERENTIAL/PLATELET
BASOS PCT: 1 % (ref 0.0–3.0)
Basophils Absolute: 0.1 10*3/uL (ref 0.0–0.1)
EOS PCT: 2 % (ref 0.0–5.0)
Eosinophils Absolute: 0.1 10*3/uL (ref 0.0–0.7)
HCT: 39.8 % (ref 36.0–46.0)
Hemoglobin: 13.2 g/dL (ref 12.0–15.0)
Lymphocytes Relative: 22 % (ref 12.0–46.0)
Lymphs Abs: 1.6 10*3/uL (ref 0.7–4.0)
MCHC: 33.2 g/dL (ref 30.0–36.0)
MCV: 85.3 fl (ref 78.0–100.0)
MONO ABS: 0.6 10*3/uL (ref 0.1–1.0)
MONOS PCT: 7.7 % (ref 3.0–12.0)
Neutro Abs: 5 10*3/uL (ref 1.4–7.7)
Neutrophils Relative %: 67.3 % (ref 43.0–77.0)
Platelets: 292 10*3/uL (ref 150.0–400.0)
RBC: 4.66 Mil/uL (ref 3.87–5.11)
RDW: 14.3 % (ref 11.5–15.5)
WBC: 7.4 10*3/uL (ref 4.0–10.5)

## 2016-02-21 LAB — URIC ACID: URIC ACID, SERUM: 5.7 mg/dL (ref 2.4–7.0)

## 2016-02-21 LAB — COMPREHENSIVE METABOLIC PANEL
ALT: 21 U/L (ref 0–35)
AST: 15 U/L (ref 0–37)
Albumin: 4.2 g/dL (ref 3.5–5.2)
Alkaline Phosphatase: 81 U/L (ref 39–117)
BUN: 18 mg/dL (ref 6–23)
CHLORIDE: 102 meq/L (ref 96–112)
CO2: 32 meq/L (ref 19–32)
Calcium: 10 mg/dL (ref 8.4–10.5)
Creatinine, Ser: 0.7 mg/dL (ref 0.40–1.20)
GFR: 106.23 mL/min (ref >60.00)
GLUCOSE: 106 mg/dL — AB (ref 70–99)
Potassium: 4 mEq/L (ref 3.5–5.1)
SODIUM: 140 meq/L (ref 135–145)
Total Bilirubin: 0.9 mg/dL (ref 0.2–1.2)
Total Protein: 7.3 g/dL (ref 6.0–8.3)

## 2016-02-21 LAB — VITAMIN D 25 HYDROXY (VIT D DEFICIENCY, FRACTURES): VITD: 20.56 ng/mL — ABNORMAL LOW (ref 30.00–100.00)

## 2016-02-21 LAB — LIPID PANEL
CHOLESTEROL: 246 mg/dL — AB (ref 0–200)
HDL: 72.5 mg/dL (ref >39.00)
LDL CALC: 151 mg/dL — AB (ref 0–99)
NONHDL: 173.06
Total CHOL/HDL Ratio: 3
Triglycerides: 110 mg/dL (ref 0.0–149.0)
VLDL: 22 mg/dL (ref 0.0–40.0)

## 2016-02-21 LAB — HEMOGLOBIN A1C: Hgb A1c MFr Bld: 5.7 % (ref 4.6–6.5)

## 2016-02-21 LAB — TSH: TSH: 1.67 u[IU]/mL (ref 0.35–4.50)

## 2016-02-21 NOTE — Patient Instructions (Signed)
-------Please contact [employer] to discuss coverage for Shingles and Tetanus vaccines.   -----Also, please don't forget to schedule bone density for same day appointment as mammogram in July 2018.    Melanie Lang , Thank you for taking time to come for your Medicare Wellness Visit. I appreciate your ongoing commitment to your health goals. Please review the following plan we discussed and let me know if I can assist you in the future.   These are the goals we discussed: Goals    . Increase physical activity          Starting 02/21/16, I will continue to walk at least 30 min 3 days per week.        This is a list of the screening recommended for you and due dates:  Health Maintenance  Topic Date Due  . Pneumonia vaccines (2 of 2 - PCV13) 04/19/2016*  . Shingles Vaccine  02/20/2017*  . Tetanus Vaccine  01/20/2020*  . Complete foot exam   08/20/2016  . Hemoglobin A1C  08/20/2016  . Eye exam for diabetics  11/20/2016  . Mammogram  08/13/2017  . Colon Cancer Screening  01/05/2019  . DEXA scan (bone density measurement)  Completed  .  Hepatitis C: One time screening is recommended by Center for Disease Control  (CDC) for  adults born from [birth year] through 1965.   Completed  *Topic was postponed. The date shown is not the original due date.   Preventive Care for Adults  A healthy lifestyle and preventive care can promote health and wellness. Preventive health guidelines for adults include the following key practices.  . A routine yearly physical is a good way to check with your health care provider about your health and preventive screening. It is a chance to share any concerns and updates on your health and to receive a thorough exam.  . Visit your dentist for a routine exam and preventive care every 6 months. Brush your teeth twice a day and floss once a day. Good oral hygiene prevents tooth decay and gum disease.  . The frequency of eye exams is based on your age, health, family medical  history, use  of contact lenses, and other factors. Follow your health care provider's ecommendations for frequency of eye exams.  . Eat a healthy diet. Foods like vegetables, fruits, whole grains, low-fat dairy products, and lean protein foods contain the nutrients you need without too many calories. Decrease your intake of foods high in solid fats, added sugars, and salt. Eat the right amount of calories for you. Get information about a proper diet from your health care provider, if necessary.  . Regular physical exercise is one of the most important things you can do for your health. Most adults should get at least 150 minutes of moderate-intensity exercise (any activity that increases your heart rate and causes you to sweat) each week. In addition, most adults need muscle-strengthening exercises on 2 or more days a week.  Silver Sneakers may be a benefit available to you. To determine eligibility, you may visit the website: www.silversneakers.com or contact program at (820)533-3215 Mon-Fri between 8AM-8PM.   . Maintain a healthy weight. The body mass index (BMI) is a screening tool to identify possible weight problems. It provides an estimate of body fat based on height and weight. Your health care provider can find your BMI and can help you achieve or maintain a healthy weight.   For adults 20 years and older: ? A BMI below 18.5  is considered underweight. ? A BMI of 18.5 to 24.9 is normal. ? A BMI of 25 to 29.9 is considered overweight. ? A BMI of 30 and above is considered obese.   . Maintain normal blood lipids and cholesterol levels by exercising and minimizing your intake of saturated fat. Eat a balanced diet with plenty of fruit and vegetables. Blood tests for lipids and cholesterol should begin at age 51 and be repeated every 5 years. If your lipid or cholesterol levels are high, you are over 50, or you are at high risk for heart disease, you may need your cholesterol levels checked  more frequently. Ongoing high lipid and cholesterol levels should be treated with medicines if diet and exercise are not working.  . If you smoke, find out from your health care provider how to quit. If you do not use tobacco, please do not start.  . If you choose to drink alcohol, please do not consume more than 2 drinks per day. One drink is considered to be 12 ounces (355 mL) of beer, 5 ounces (148 mL) of wine, or 1.5 ounces (44 mL) of liquor.  . If you are 59-85 years old, ask your health care provider if you should take aspirin to prevent strokes.  . Use sunscreen. Apply sunscreen liberally and repeatedly throughout the day. You should seek shade when your shadow is shorter than you. Protect yourself by wearing long sleeves, pants, a wide-brimmed hat, and sunglasses year round, whenever you are outdoors.  . Once a month, do a whole body skin exam, using a mirror to look at the skin on your back. Tell your health care provider of new moles, moles that have irregular borders, moles that are larger than a pencil eraser, or moles that have changed in shape or color.

## 2016-02-21 NOTE — Progress Notes (Signed)
Subjective:   Melanie Lang is a 71 y.o. female who presents for Medicare Annual (Subsequent) preventive examination.  Review of Systems:  N/A Cardiac Risk Factors include: advanced age (>34men, >40 women);diabetes mellitus;hypertension;obesity (BMI >30kg/m2)     Objective:     Vitals: BP (!) 150/90 (BP Location: Right Arm, Patient Position: Sitting, Cuff Size: Normal) Comment: BP medication not taken  Pulse 74   Temp 98.5 F (36.9 C) (Oral)   Ht 5\' 4"  (1.626 m) Comment: no shoes  Wt 195 lb 8 oz (88.7 kg)   SpO2 98%   BMI 33.56 kg/m   Body mass index is 33.56 kg/m.   Tobacco History  Smoking Status  . Never Smoker  Smokeless Tobacco  . Never Used     Counseling given: No   Past Medical History:  Diagnosis Date  . Allergy   . Arthritis   . Asthma   . Diabetes mellitus without complication ([place])    borderline controlled with diet   . GERD (gastroesophageal reflux disease)   . Hyperlipidemia   . Hypertension    Past Surgical History:  Procedure Laterality Date  . ABDOMINAL HYSTERECTOMY    . BILATERAL CARPAL TUNNEL RELEASE    . KNEE SURGERY    . TONSILLECTOMY    . 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 Asthma Mother   . Heart failure Father   . Stroke Father   . Cancer Maternal Grandmother     colon  . Diabetes Maternal Grandfather    History  Sexual Activity  . Sexual activity: No    Outpatient Encounter Prescriptions as of 02/21/2016  Medication Sig  . Alcohol Swabs (B-D SINGLE USE SWABS REGULAR) PADS Use to check blood sugar daily.  Dx: E11.9  . amLODipine (NORVASC) 10 MG tablet TAKE 1 TABLET ONE TIME DAILY  . aspirin EC 81 MG tablet Take 81 mg by mouth daily.  . Blood Glucose Calibration (TRUE METRIX LEVEL 1) Low SOLN Use as directed to check controls on glucose  meter.  E11.9  . Blood Glucose Monitoring Suppl (TRUE METRIX AIR GLUCOSE METER) DEVI 1 each by Does not apply route daily. E11.9  . COLCRYS 0.6 MG tablet TAKE 1 TABLET BY MOUTH 2 TIMES DAILY.  . furosemide (LASIX) 20 MG tablet TAKE 1 TABLET BY MOUTH DAILY AS NEEDED FOR FLUID (Patient taking differently: Take 20 mg by mouth daily as needed for fluid. )  . glucose blood test strip True Metrix Test Strips.  Use to check blood sugar daily.  Dx: E11.9  . lisinopril (PRINIVIL,ZESTRIL) 40 MG tablet TAKE 1 TABLET ONE TIME DAILY  . Naphazoline-Pheniramine (OPCON-A OP) Place 1 drop into both eyes daily as needed (For allergies.).  Marland Kitchen TRUEPLUS LANCETS 33G MISC Use to check blood sugar daily.  Dx: E11.9  . [DISCONTINUED] methocarbamol (ROBAXIN) 500 MG tablet Take 1 tablet (500 mg total) by mouth every 6 (six) hours as needed for muscle spasms.  . [DISCONTINUED] oxyCODONE (OXY IR/ROXICODONE) 5 MG immediate release tablet Take 1-2 tablets (5-10 mg total) by mouth every 3 (three) hours as needed for breakthrough pain.  . [DISCONTINUED] traMADol (ULTRAM) 50 MG tablet Take 1-2 tablets (50-100 mg total) by mouth every 6 (six) hours as needed for moderate pain.   No facility-administered encounter medications on file as  of 02/21/2016.     Activities of Daily Living In your present state of health, do you have any difficulty performing the following activities: 02/21/2016 07/04/2015  Hearing? Y N  Vision? Y N  Difficulty concentrating or making decisions? N N  Walking or climbing stairs? N Y  Dressing or bathing? N N  Doing errands, shopping? N N  Preparing Food and eating ? N -  Using the Toilet? N -  In the past six months, have you accidently leaked urine? N -  Do you have problems with loss of bowel control? N -  Managing your Medications? N -  Managing your Finances? N -  Housekeeping or managing your Housekeeping? N -  Some recent data might be hidden    Patient Care Team: Jinny Sanders, MD as PCP -  General    Assessment:     Hearing Screening   125Hz  250Hz  500Hz  1000Hz  2000Hz  3000Hz  4000Hz  6000Hz  8000Hz   Right ear:   0 0 40  40    Left ear:   0 40 0  40    Vision Screening Comments: Last vision exam in November 2017   Exercise Activities and Dietary recommendations Current Exercise Habits: Home exercise routine, Type of exercise: walking, Time (Minutes): 30, Frequency (Times/Week): 3, Weekly Exercise (Minutes/Week): 90, Intensity: Mild, Exercise limited by: None identified  Goals    . Increase physical activity          Starting 02/21/16, I will continue to walk at least 30 min 3 days per week.       Fall Risk Fall Risk  02/21/2016 02/19/2015 01/06/2014 12/21/2012  Falls in the past year? No No No No   Depression Screen PHQ 2/9 Scores 02/21/2016 02/19/2015 01/06/2014 12/21/2012  PHQ - 2 Score 0 0 0 0     Cognitive Function MMSE - Mini Mental State Exam 02/21/2016  Orientation to time 5  Orientation to Place 5  Registration 3  Attention/ Calculation 0  Recall 3  Language- name 2 objects 0  Language- repeat 1  Language- follow 3 step command 3  Language- read & follow direction 0  Write a sentence 0  Copy design 0  Total score 20       PLEASE NOTE: A Mini-Cog screen was completed. Maximum score is 20. A value of 0 denotes this part of Folstein MMSE was not completed or the patient failed this part of the Mini-Cog screening.   Mini-Cog Screening Orientation to Time - Max 5 pts Orientation to Place - Max 5 pts Registration - Max 3 pts Recall - Max 3 pts Language Repeat - Max 1 pts Language Follow 3 Step Command - Max 3 pts   Immunization History  Administered Date(s) Administered  . Pneumococcal Polysaccharide-23 12/08/2011  . Td 01/21/2000   Screening Tests Health Maintenance  Topic Date Due  . PNA vac Low Risk Adult (2 of 2 - PCV13) 04/19/2016 (Originally 12/07/2012)  . ZOSTAVAX  02/20/2017 (Originally 08/13/2005)  . TETANUS/TDAP  01/20/2020 (Originally  01/20/2010)  . FOOT EXAM  08/20/2016  . HEMOGLOBIN A1C  08/20/2016  . OPHTHALMOLOGY EXAM  11/20/2016  . MAMMOGRAM  08/13/2017  . COLONOSCOPY  01/05/2019  . DEXA SCAN  Completed  . Hepatitis C Screening  Completed      Plan:     I have personally reviewed and addressed the Medicare Annual Wellness questionnaire and have noted the following in the patient's chart:  A. Medical and social history B. Use of  alcohol, tobacco or illicit drugs  C. Current medications and supplements D. Functional ability and status E.  Nutritional status F.  Physical activity G. Advance directives H. List of other physicians I.  Hospitalizations, surgeries, and ER visits in previous 12 months J.  [place] to include hearing, vision, cognitive, depression L. Referrals and appointments - none  In addition, I have reviewed and discussed with patient certain preventive protocols, quality metrics, and best practice recommendations. A written personalized care plan for preventive services as well as general preventive health recommendations were provided to patient.  See attached scanned questionnaire for additional information.   Signed,   Lindell Noe, MHA, BS, LPN Health Coach'

## 2016-02-21 NOTE — Progress Notes (Signed)
PCP notes:   Health maintenance:  A1C - completed Bone density - addressed; ordered Tetanus - postponed/insurance Shingles - postponed/insurance PCV13 - postponed/pt declined Flu vaccine - pt declined Eye exam - per pt, exam in Nov 2017  Abnormal screenings:   Hearing - failed  Patient concerns:   None  Nurse concerns:  None  Next PCP appt:   02/28/16 @ 1000

## 2016-02-21 NOTE — Progress Notes (Signed)
Pre visit review using our clinic review tool, if applicable. No additional management support is needed unless otherwise documented below in the visit note. 

## 2016-02-21 NOTE — Progress Notes (Signed)
Medical screening examination/treatment/procedure(s) were performed by registered nurse and as supervising non-physician provider I was immediately available for consultation/collaboration.   Webb Silversmith, NP

## 2016-02-28 ENCOUNTER — Encounter: Payer: Commercial Managed Care - HMO | Admitting: Family Medicine

## 2016-03-05 ENCOUNTER — Encounter: Payer: Self-pay | Admitting: Family Medicine

## 2016-03-05 ENCOUNTER — Ambulatory Visit (INDEPENDENT_AMBULATORY_CARE_PROVIDER_SITE_OTHER): Payer: Medicare HMO | Admitting: Family Medicine

## 2016-03-05 VITALS — BP 140/84 | HR 89 | Temp 98.5°F | Ht 64.0 in | Wt 196.0 lb

## 2016-03-05 DIAGNOSIS — I10 Essential (primary) hypertension: Secondary | ICD-10-CM | POA: Diagnosis not present

## 2016-03-05 DIAGNOSIS — E7800 Pure hypercholesterolemia, unspecified: Secondary | ICD-10-CM

## 2016-03-05 DIAGNOSIS — Z1231 Encounter for screening mammogram for malignant neoplasm of breast: Secondary | ICD-10-CM

## 2016-03-05 DIAGNOSIS — E559 Vitamin D deficiency, unspecified: Secondary | ICD-10-CM

## 2016-03-05 DIAGNOSIS — M1A9XX0 Chronic gout, unspecified, without tophus (tophi): Secondary | ICD-10-CM

## 2016-03-05 DIAGNOSIS — E119 Type 2 diabetes mellitus without complications: Secondary | ICD-10-CM

## 2016-03-05 LAB — HM DIABETES FOOT EXAM

## 2016-03-05 MED ORDER — VITAMIN D3 1.25 MG (50000 UT) PO CAPS: ORAL_CAPSULE | ORAL | 0 refills | Status: DC

## 2016-03-05 MED ORDER — ATORVASTATIN CALCIUM 20 MG PO TABS: 30.0000 mg | ORAL_TABLET | Freq: Every day | ORAL | 3 refills | Status: DC

## 2016-03-05 NOTE — Assessment & Plan Note (Signed)
Inadequate control.Marland Kitchen Restart statin but increase. Pt hesitant about increasing dose so will increase only to 30 mg daily. Re-eval labs in 3-6 months.

## 2016-03-05 NOTE — Progress Notes (Signed)
Pre visit review using our clinic review tool, if applicable. No additional management support is needed unless otherwise documented below in the visit note. 

## 2016-03-05 NOTE — Assessment & Plan Note (Signed)
Encouraged exercise, weight loss, healthy eating habits. Follow BP at home.. Call if BP not at goal at home.. On current regimen.

## 2016-03-05 NOTE — Progress Notes (Signed)
Subjective:    Patient ID: Melanie Lang, female    DOB: January 04, 1946, 71 y.o.   MRN: [id number]  HPI   The patient saw Candis Musa, LPN for medicare wellness on 02/21/2016 Note reviewed in detail and important notes copied below.  Health maintenance:  A1C - completed Bone density - addressed; ordered Tetanus - postponed/insurance Shingles - postponed/insurance PCV13 - postponed/pt declined Flu vaccine - pt declined Eye exam - per pt, exam in Nov 2017  Abnormal screenings:  Hearing - failed  Patient concerns:  None  Nurse concerns: None  TODAY:  Diabetes:   Well controlled on  Diet control. Lab Results  Component Value Date   HGBA1C 5.7 02/21/2016  Using medications without difficulties: Hypoglycemic episodes: Hyperglycemic episodes: Feet problems: Blood Sugars averaging: eye exam within last year:  Hypertension:    Borderline control in office today on  Max amlodipine, max lisinopril and lasix prn ( using 2 times a week). BP Readings from Last 3 Encounters:  03/05/16 140/84  02/21/16 (!) 150/90  08/21/15 140/82  Using medication without problems or lightheadedness:  none Chest pain with exertion:None Edema:none Short of breath:none Average home BPs: 130-140/70-80 Other issues:  Elevated Cholesterol:  High risk CVD on  Moderate dose statin. She has not been taking chol med in  Last 2 months.  Better control on the medicaiton but still at goal Lab Results  Component Value Date   CHOL 246 (H) 02/21/2016   HDL 72.50 02/21/2016   LDLCALC 151 (H) 02/21/2016   LDLDIRECT 132.7 12/07/2012   TRIG 110.0 02/21/2016   CHOLHDL 3 02/21/2016  Using medications without problems: none Muscle aches: none Diet compliance: moderate Exercise: Planning to restart Other complaints:   Gout .Marland Kitchen Well controlled but has been off  She has continued to have flares in 11, 12/2015. Was able to use colcrys x 1 day resolved symptoms.  uric acid in nml range.   Vit  d def: persistent  Social History /Family History/Past Medical History reviewed and updated if needed.   Review of Systems  Constitutional: Negative for fatigue and fever.  HENT: Negative for congestion.   Eyes: Negative for pain.  Respiratory: Negative for cough and shortness of breath.   Cardiovascular: Negative for chest pain, palpitations and leg swelling.  Gastrointestinal: Negative for abdominal pain.  Genitourinary: Negative for dysuria and vaginal bleeding.  Musculoskeletal: Positive for back pain.  Neurological: Negative for syncope, light-headedness and headaches.  Psychiatric/Behavioral: Negative for dysphoric mood.       Objective:   Physical Exam  Constitutional: Vital signs are normal. She appears well-developed and well-nourished. She is cooperative.  Non-toxic appearance. She does not appear ill. No distress.  overweight  HENT:  Head: Normocephalic.  Right Ear: Hearing, tympanic membrane, external ear and ear canal normal.  Left Ear: Hearing, tympanic membrane, external ear and ear canal normal.  Nose: Nose normal.  Eyes: Conjunctivae, EOM and lids are normal. Pupils are equal, round, and reactive to light. Lids are everted and swept, no foreign bodies found.  Neck: Trachea normal and normal range of motion. Neck supple. Carotid bruit is not present. No thyroid mass and no thyromegaly present.  Cardiovascular: Normal rate, regular rhythm, S1 normal, S2 normal, normal heart sounds and intact distal pulses.  Exam reveals no gallop.   No murmur heard. Pulmonary/Chest: Effort normal and breath sounds normal. No respiratory distress. She has no wheezes. She has no rhonchi. She has no rales.  Abdominal: Soft. Normal appearance and bowel  sounds are normal. She exhibits no distension, no fluid wave, no abdominal bruit and no mass. There is no hepatosplenomegaly. There is no tenderness. There is no rebound, no guarding and no CVA tenderness. No hernia.  Lymphadenopathy:     She has no cervical adenopathy.    She has no axillary adenopathy.  Neurological: She is alert. She has normal strength. No cranial nerve deficit or sensory deficit.  Skin: Skin is warm, dry and intact. No rash noted.  Psychiatric: Her speech is normal and behavior is normal. Judgment normal. Her mood appears not anxious. Cognition and memory are normal. She does not exhibit a depressed mood.     Diabetic foot exam: Bilateral bunions and crossover of toes No skin breakdown  Moderatecalluses  Normal DP pulses Normal sensation to light touch and monofilament Nails thickened      Assessment & Plan:  The patient's preventative maintenance and recommended screening tests for an annual wellness exam were reviewed in full today. Brought up to date unless services declined.  Counselled on the importance of diet, exercise, and its role in overall health and mortality. The patient's FH and SH was reviewed, including their home life, tobacco status, and drug and alcohol status.   Vaccines: refused above Colon: 3 polyps 12/2013 Dr. Watt Climes. Repeat in 5 years. Mammogram: Last nml in 07/2015. DXA: osteopenia 03/2011, t-1.9 on vit D,  repeat in 5 years Pap/DVE: not indicated total hysterectomy for non cancer indication. Non smoker Hep C: done

## 2016-03-05 NOTE — Assessment & Plan Note (Signed)
Still having flares but easily controlled with colcrys and uric acid at goal. No clear indication for allopurinol.

## 2016-03-05 NOTE — Assessment & Plan Note (Signed)
Stable control with diet.  

## 2016-03-05 NOTE — Assessment & Plan Note (Signed)
Replete then continue maintenance dose given repeat lows.

## 2016-03-05 NOTE — Patient Instructions (Addendum)
Work on stress reduction, regular exercise and healthy eating.  Follow blood pressure at home.  repeat a course of vit D weekly x 12 weeks then follow with daily vit D OTC 400 IU 1-2 times daily.  Increase lipitor to 30 mg daily .   Stop at front desk for referral for bone density and mammo.

## 2016-04-07 ENCOUNTER — Other Ambulatory Visit: Payer: Self-pay | Admitting: Family Medicine

## 2016-05-21 ENCOUNTER — Encounter: Payer: Self-pay | Admitting: Podiatry

## 2016-05-21 ENCOUNTER — Ambulatory Visit (INDEPENDENT_AMBULATORY_CARE_PROVIDER_SITE_OTHER): Payer: Medicare HMO | Admitting: Podiatry

## 2016-05-21 ENCOUNTER — Ambulatory Visit: Payer: Medicare HMO

## 2016-05-21 ENCOUNTER — Ambulatory Visit (INDEPENDENT_AMBULATORY_CARE_PROVIDER_SITE_OTHER): Payer: Medicare HMO

## 2016-05-21 VITALS — HR 89 | Resp 16 | Ht 65.0 in | Wt 189.0 lb

## 2016-05-21 DIAGNOSIS — M722 Plantar fascial fibromatosis: Secondary | ICD-10-CM

## 2016-05-21 DIAGNOSIS — M79605 Pain in left leg: Secondary | ICD-10-CM | POA: Diagnosis not present

## 2016-05-21 DIAGNOSIS — M21619 Bunion of unspecified foot: Secondary | ICD-10-CM

## 2016-05-21 DIAGNOSIS — B351 Tinea unguium: Secondary | ICD-10-CM

## 2016-05-21 DIAGNOSIS — M79604 Pain in right leg: Secondary | ICD-10-CM

## 2016-05-21 MED ORDER — TRIAMCINOLONE ACETONIDE 10 MG/ML IJ SUSP
10.0000 mg | Freq: Once | INTRAMUSCULAR | Status: AC
  Administered 2016-05-21: 10 mg

## 2016-05-21 NOTE — Progress Notes (Signed)
Subjective:    Patient ID: Melanie Lang, female   DOB: 71 y.o.   MRN: [id number]   HPI patient presents stating that the right heel has been very tender and she has a history of nail problems with getting them trimmed and she cannot do it herself and she has diabetes and they are painful and thick. States the heels been hurting for several months and the nails been present for a number of years and they've been trimmed in the past    Review of Systems  All other systems reviewed and are negative.       Objective:  Physical Exam  Cardiovascular: Intact distal pulses.   Musculoskeletal: Normal range of motion.  Neurological: She is alert.  Skin: Skin is warm.  Nursing note and vitals reviewed.  neurovascular status intact muscle strength was adequate range of motion within normal limits with patient found to have exquisite discomfort in the plantar heel right at the insertional point tendon calcaneus with fluid buildup and is also noted to have thick yellow brittle nailbeds 1-5 both feet that are painful when pressed. Patient has good digital perfusion well oriented with no other pathology     Assessment:    Acute plantar fasciitis right with inflammation fluid buildup and thick yellow brittle nailbeds 1-5 both feet that are painful     Plan:     Reviewed both conditions and x-rays right. I injected the plantar fascia 3 mg Kenalog 5 mg Xylocaine and then went ahead and debrided nailbeds 1-5 both feet with no subungual bleeding noted. I also instructed on physical therapy and dispensed plantar fascial brace  X-ray indicated that there is spur formation right heel

## 2016-05-21 NOTE — Progress Notes (Signed)
   Subjective:    Patient ID: Melanie Lang, female    DOB: 12/17/1945, 71 y.o.   MRN: [id number]  HPI Chief Complaint  Patient presents with  . Foot Pain    Right foot; bottom of heel; pt stated, "Hurts more in the morning"; x1 month      Review of Systems  Eyes: Positive for itching.  Musculoskeletal: Positive for gait problem.  Allergic/Immunologic: Positive for food allergies.  All other systems reviewed and are negative.      Objective:   Physical Exam        Assessment & Plan:

## 2016-05-21 NOTE — Patient Instructions (Signed)

## 2016-06-02 IMAGING — CT CT HEAD W/O CM
1 series · 16 of 30 positions shown, 20 images · non-contrast
Comparison: 12/07/2003

CLINICAL DATA: Headache and left eye pain.

EXAM:
CT HEAD WITHOUT CONTRAST
TECHNIQUE: Contiguous axial images were obtained from the base of the skull
through the vertex without contrast.

[Series 2: head 5.0 h30s · axial · 0.40mm/px · z∈[-137,-2]mm · 16 of 30 slices shown, 20 images]
[im 2/30  brain]
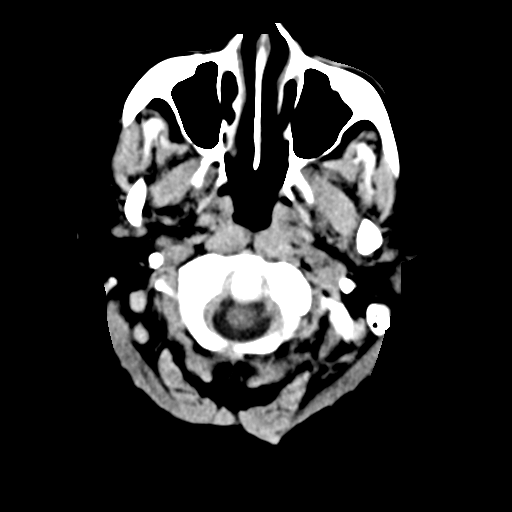
[im 2/30  bone]
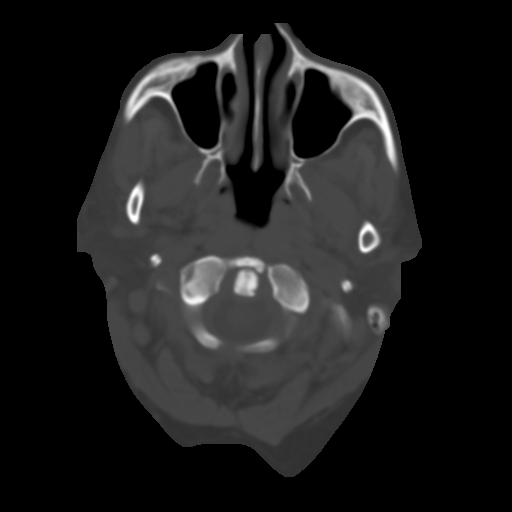
[im 4/30  brain]
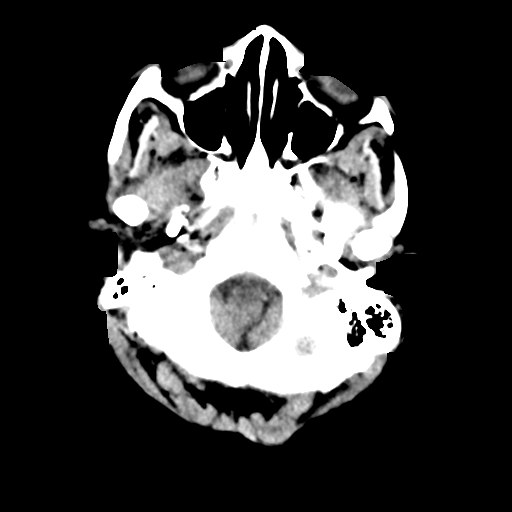
[im 6/30  brain]
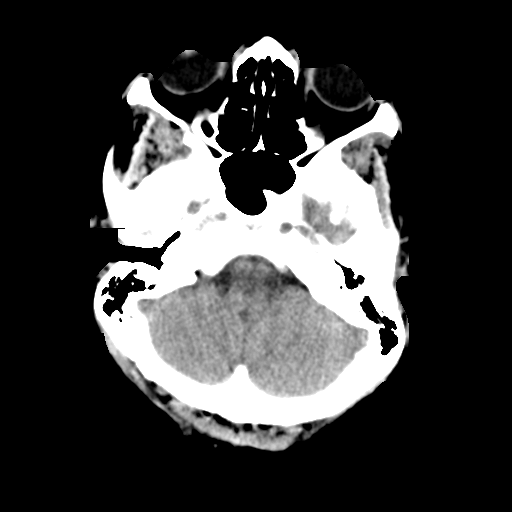
[im 8/30  brain]
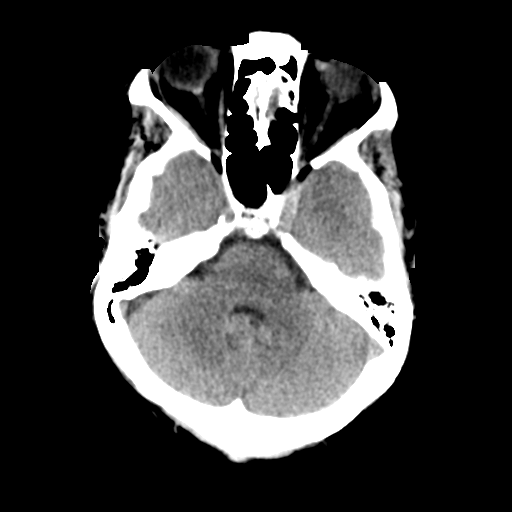
[im 9/30  brain]
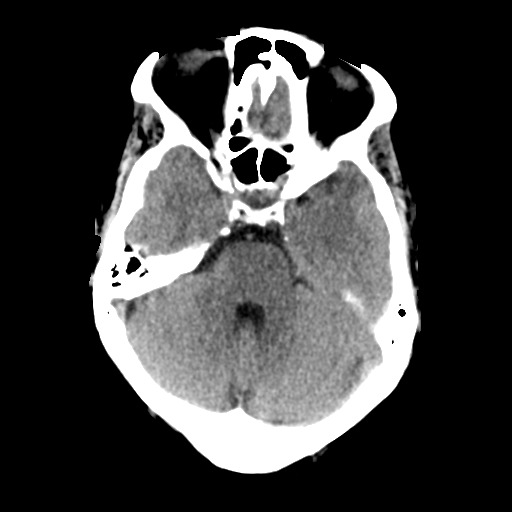
[im 9/30  bone]
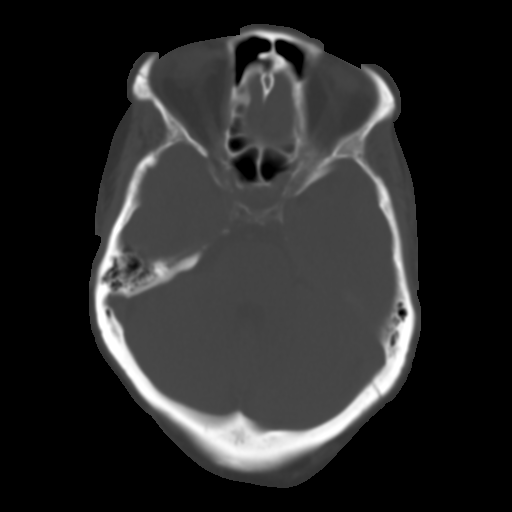
[im 11/30  brain]
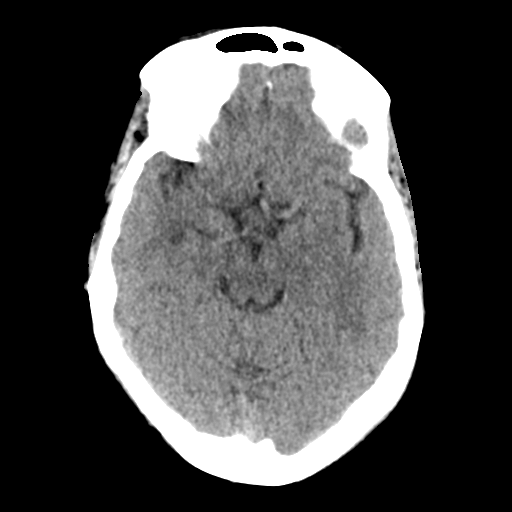
[im 13/30  brain]
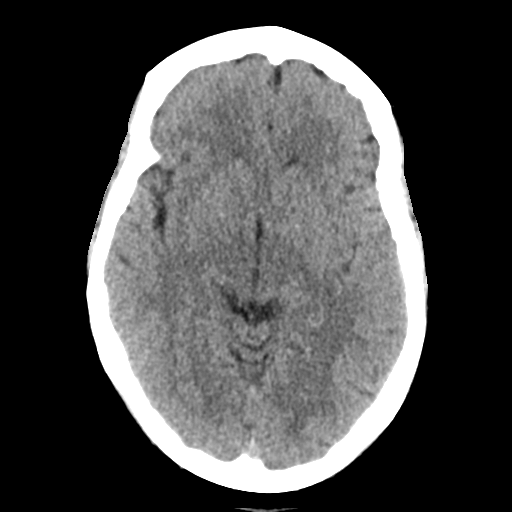
[im 15/30  brain]
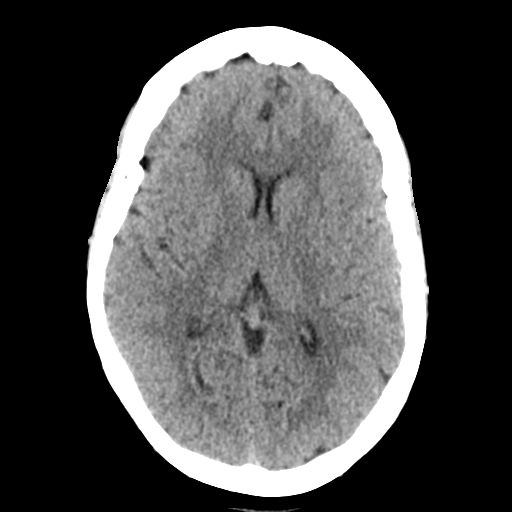
[im 16/30  brain]
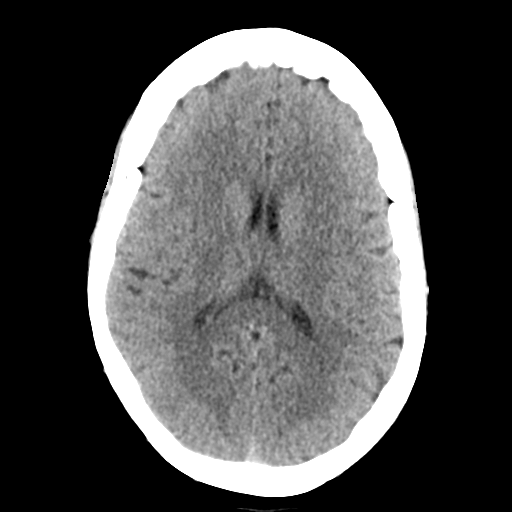
[im 16/30  bone]
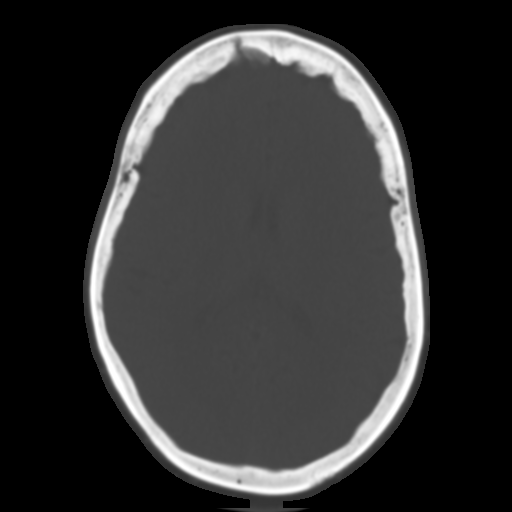
[im 18/30  brain]
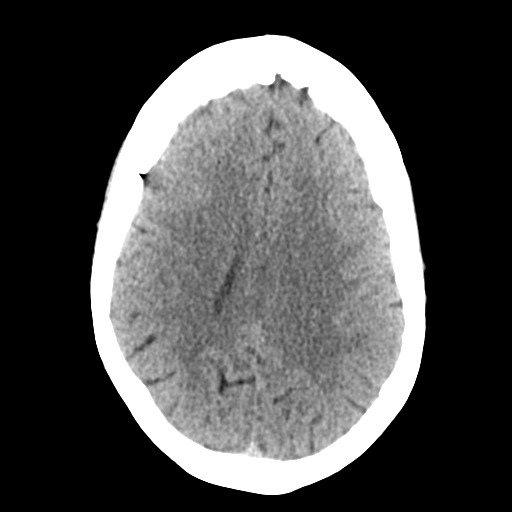
[im 20/30  brain]
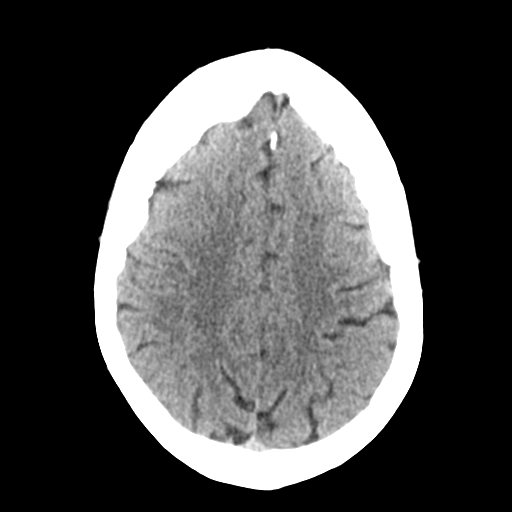
[im 22/30  brain]
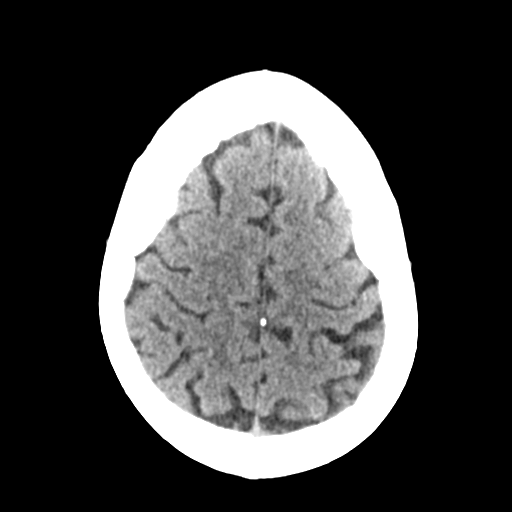
[im 23/30  brain]
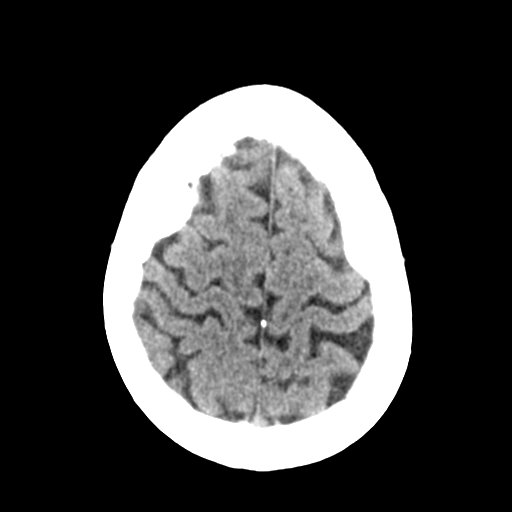
[im 23/30  bone]
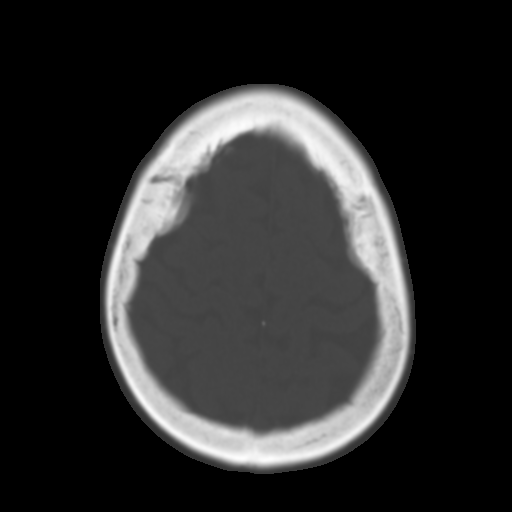
[im 25/30  brain]
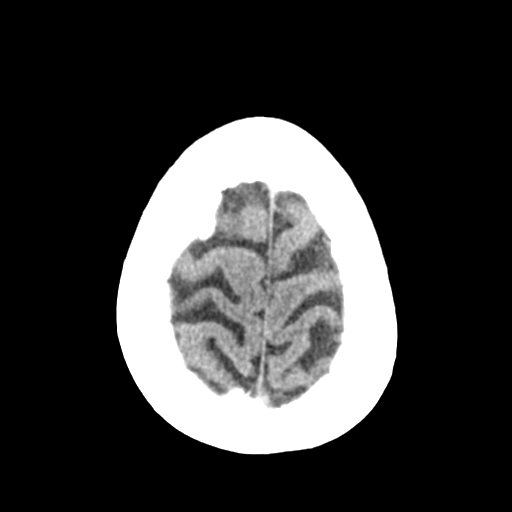
[im 27/30  brain]
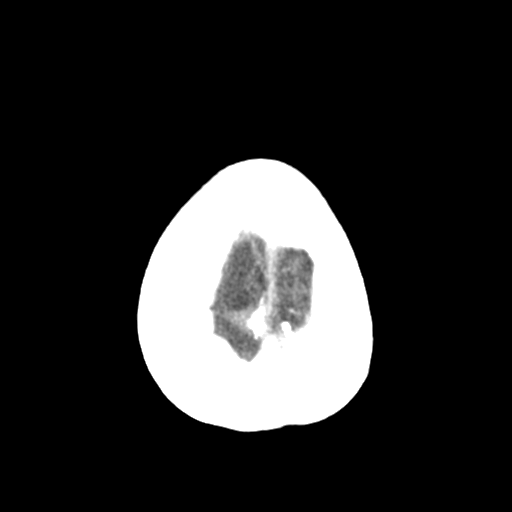
[im 29/30  brain]
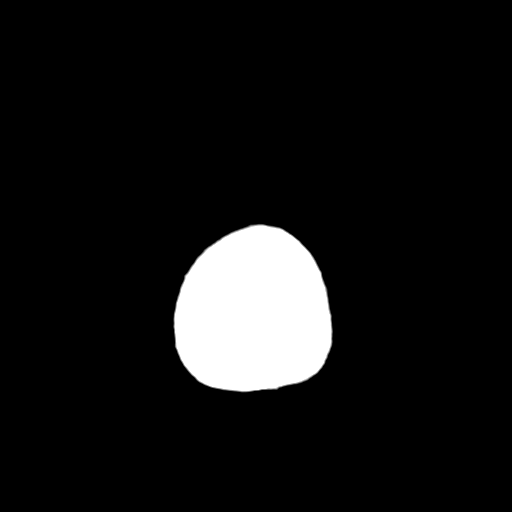

[16 of 30 positions shown; findings below may reference images not displayed]

FINDINGS: Normal appearance of the intracranial structures. No evidence for
acute hemorrhage, mass lesion, midline shift, hydrocephalus or large
infarct. No acute bony abnormality. The visualized sinuses are
clear.
IMPRESSION: Negative head CT.

## 2016-06-04 ENCOUNTER — Encounter: Payer: Self-pay | Admitting: Podiatry

## 2016-06-04 ENCOUNTER — Ambulatory Visit (INDEPENDENT_AMBULATORY_CARE_PROVIDER_SITE_OTHER): Payer: Medicare HMO | Admitting: Podiatry

## 2016-06-04 DIAGNOSIS — M722 Plantar fascial fibromatosis: Secondary | ICD-10-CM | POA: Diagnosis not present

## 2016-06-04 MED ORDER — MELOXICAM 15 MG PO TABS: 15.0000 mg | ORAL_TABLET | Freq: Every day | ORAL | 2 refills | Status: DC

## 2016-06-04 MED ORDER — TRIAMCINOLONE ACETONIDE 10 MG/ML IJ SUSP
10.0000 mg | Freq: Once | INTRAMUSCULAR | Status: AC
  Administered 2016-06-04: 10 mg

## 2016-06-05 NOTE — Progress Notes (Signed)
Subjective:    Patient ID: Melanie Lang, female   DOB: 71 y.o.   MRN: [id number]   HPI patient presents stating that the pain in the right heel lasted for 3 days as far as improvement and while I'm mildly better I'm still having quite a bit of pain especially when I get up in the morning and after periods of sitting    ROS      Objective:  Physical Exam Neurovascular status intact muscle strength adequate range of motion within normal limits with patient noted to have exquisite discomfort in the plantar aspect right heel medial band still present with fluid buildup around the area    Assessment:   Acute plantar fasciitis right present      Plan:   H&P condition reviewed and I went ahead reinjected the plantar fascia right 3 mg Kenalog 5 mg Xylocaine and dispensed night splint with all instructions on usage for sleeping and evening usage along with ice therapy. Reappoint 3 weeks or earlier if needed

## 2016-06-12 ENCOUNTER — Other Ambulatory Visit: Payer: Self-pay | Admitting: Family Medicine

## 2016-06-25 ENCOUNTER — Encounter: Payer: Self-pay | Admitting: Podiatry

## 2016-06-25 ENCOUNTER — Ambulatory Visit (INDEPENDENT_AMBULATORY_CARE_PROVIDER_SITE_OTHER): Payer: Medicare HMO | Admitting: Podiatry

## 2016-06-25 DIAGNOSIS — M722 Plantar fascial fibromatosis: Secondary | ICD-10-CM

## 2016-06-25 NOTE — Progress Notes (Signed)
Subjective:    Patient ID: Melanie Lang, female   DOB: 71 y.o.   MRN: [id number]   HPI patient states she's feeling a lot better with the right foot    ROS      Objective:  Physical Exam Neurovascular status intact negative Homans sign was noted with patient's right heel significantly improved with mild discomfort but minimal pain when palpated    Assessment:    Plantar fasciitis right improved     Plan:    Advised on physical therapy anti-inflammatories supportive shoe gear usage and patient be seen back as needed

## 2016-06-27 ENCOUNTER — Encounter: Payer: Self-pay | Admitting: Adult Health

## 2016-06-27 ENCOUNTER — Ambulatory Visit (INDEPENDENT_AMBULATORY_CARE_PROVIDER_SITE_OTHER): Payer: Medicare HMO | Admitting: Adult Health

## 2016-06-27 VITALS — BP 140/88 | HR 100 | Temp 98.8°F | Wt 203.3 lb

## 2016-06-27 DIAGNOSIS — T148XXA Other injury of unspecified body region, initial encounter: Secondary | ICD-10-CM | POA: Diagnosis not present

## 2016-06-27 NOTE — Progress Notes (Signed)
Subjective:    Patient ID: Melanie Lang, female    DOB: 1945/06/12, 71 y.o.   MRN: [id number]  HPI  71 year old female who  has a past medical history of Allergy; Arthritis; Asthma; Diabetes mellitus without complication ([place]); GERD (gastroesophageal reflux disease); Hyperlipidemia; and Hypertension. She is a patient of Dr. Diona Browner. She presents to the office today for bruising on her right bicep. She reports that she woke up 4 days ago with the bruise. The night before she threw a bag on the counter and in his action she felt arm pain in her right bicep. She denies any current pain and has normal ROM. She is worried about the bruise   She takes ASA daily   Review of Systems See HPI   Past Medical History:  Diagnosis Date  . Allergy   . Arthritis   . Asthma   . Diabetes mellitus without complication ([place])    borderline controlled with diet   . GERD (gastroesophageal reflux disease)   . Hyperlipidemia   . Hypertension     Social History   Social History  . Marital status: Married    Spouse name: N/A  . Number of children: N/A  . Years of education: N/A   Occupational History  . semiretired(babysitter) Unemployed   Social History Main Topics  . Smoking status: Never Smoker  . Smokeless tobacco: Never Used  . Alcohol use No  . Drug use: No  . Sexual activity: No   Other Topics Concern  . Not on file   Social History Narrative   Regular exercise--yes, walks   Diet: healthy, veggies, fruit, avoid salts                   Past Surgical History:  Procedure Laterality Date  . ABDOMINAL HYSTERECTOMY    . BILATERAL CARPAL TUNNEL RELEASE    . KNEE SURGERY    . TONSILLECTOMY    . TOTAL KNEE REVISION Left 07/04/2015   Procedure: LEFT TOTAL KNEE REVISION;  Surgeon: Gaynelle Arabian, MD;  Location: [facility];  Service: Orthopedics;  Laterality: Left;    Family History  Problem Relation Age of Onset  . Asthma Mother   . Heart failure Father   . Stroke Father     . Cancer Maternal Grandmother        colon  . Diabetes Maternal Grandfather     Allergies  Allergen Reactions  . Shellfish Allergy Itching, Swelling and Other (See Comments)    Facial swelling  . Eggs Or Egg-Derived Products Itching  . Peanuts [Peanut Oil] Itching and Other (See Comments)    Throat itching    Current Outpatient Prescriptions on File Prior to Visit  Medication Sig Dispense Refill  . Alcohol Swabs (B-D SINGLE USE SWABS REGULAR) PADS Use to check blood sugar daily.  Dx: E11.9 100 each 3  . amLODipine (NORVASC) 10 MG tablet TAKE 1 TABLET EVERY DAY 90 tablet 1  . aspirin EC 81 MG tablet Take 81 mg by mouth daily.    Marland Kitchen atorvastatin (LIPITOR) 20 MG tablet TAKE 1 TABLET EVERY DAY 90 tablet 3  . Blood Glucose Calibration (TRUE METRIX LEVEL 1) Low SOLN USE AS DIRECTED TO CHECK CONTROLS ON GLUCOSE METER.  1 each 3  . Blood Glucose Monitoring Suppl (TRUE METRIX AIR GLUCOSE METER) DEVI 1 each by Does not apply route daily. E11.9 1 Device 0  . Cholecalciferol (VITAMIN D3) 50000 units CAPS 1 cap weekly x 12 weeks  12 capsule 0  . COLCRYS 0.6 MG tablet TAKE 1 TABLET BY MOUTH 2 TIMES DAILY. 20 tablet 0  . furosemide (LASIX) 20 MG tablet TAKE 1 TABLET ONE TIME DAILY AS NEEDED FOR  FLUID 90 tablet 1  . lisinopril (PRINIVIL,ZESTRIL) 40 MG tablet TAKE 1 TABLET EVERY DAY 90 tablet 1  . meloxicam (MOBIC) 15 MG tablet Take 1 tablet (15 mg total) by mouth daily. 30 tablet 2  . Naphazoline-Pheniramine (OPCON-A OP) Place 1 drop into both eyes daily as needed (For allergies.).    Marland Kitchen TRUE METRIX BLOOD GLUCOSE TEST test strip CHECK BLOOD SUGAR ONE TIME DAILY 100 each 3  . TRUEPLUS LANCETS 33G MISC CHECK BLOOD SUGAR EVERY DAY 100 each 3   No current facility-administered medications on file prior to visit.     BP 140/88 (BP Location: Left Arm, Patient Position: Sitting, Cuff Size: Large)   Pulse 100   Temp 98.8 F (37.1 C) (Oral)   Wt 203 lb 4.8 oz (92.2 kg)   SpO2 98%   BMI 33.83 kg/m        Objective:   Physical Exam  Constitutional: She is oriented to person, place, and time. She appears well-developed and well-nourished. No distress.  Cardiovascular: Normal rate, regular rhythm, normal heart sounds and intact distal pulses.  Exam reveals no gallop.   No murmur heard. Pulmonary/Chest: Effort normal and breath sounds normal. No respiratory distress. She has no wheezes. She has no rales. She exhibits no tenderness.  Neurological: She is alert and oriented to person, place, and time.  Skin: Skin is warm and dry. Bruising noted. No rash noted. She is not diaphoretic. No erythema. No pallor.     Psychiatric: She has a normal mood and affect. Her behavior is normal. Judgment and thought content normal.  Nursing note and vitals reviewed.     Assessment & Plan:  1. Muscle strain - No pain with palpation. No decreased ROM and is able to flex bicep without difficulty. Bruising likely from muscle strain secondary to being on ASA.  - Follow up as needed   Dorothyann Peng, NP

## 2016-08-14 DIAGNOSIS — T84018D Broken internal joint prosthesis, other site, subsequent encounter: Secondary | ICD-10-CM | POA: Diagnosis not present

## 2016-09-02 ENCOUNTER — Other Ambulatory Visit: Payer: Medicare HMO

## 2016-09-02 ENCOUNTER — Telehealth: Payer: Self-pay | Admitting: Family Medicine

## 2016-09-02 DIAGNOSIS — E559 Vitamin D deficiency, unspecified: Secondary | ICD-10-CM

## 2016-09-02 DIAGNOSIS — E7800 Pure hypercholesterolemia, unspecified: Secondary | ICD-10-CM

## 2016-09-02 DIAGNOSIS — E119 Type 2 diabetes mellitus without complications: Secondary | ICD-10-CM

## 2016-09-02 DIAGNOSIS — M8580 Other specified disorders of bone density and structure, unspecified site: Secondary | ICD-10-CM

## 2016-09-02 NOTE — Telephone Encounter (Signed)
-----   Message from Ellamae Sia sent at 08/28/2016 10:13 AM EDT ----- Regarding: Lab orders for Tuesday, 8.14.18 Lab orders for a f/u appt

## 2016-09-05 ENCOUNTER — Ambulatory Visit: Payer: Medicare HMO | Admitting: Family Medicine

## 2016-09-16 ENCOUNTER — Other Ambulatory Visit (INDEPENDENT_AMBULATORY_CARE_PROVIDER_SITE_OTHER): Payer: Medicare HMO

## 2016-09-16 DIAGNOSIS — E119 Type 2 diabetes mellitus without complications: Secondary | ICD-10-CM

## 2016-09-16 DIAGNOSIS — E7800 Pure hypercholesterolemia, unspecified: Secondary | ICD-10-CM | POA: Diagnosis not present

## 2016-09-16 LAB — COMPREHENSIVE METABOLIC PANEL
ALBUMIN: 4.1 g/dL (ref 3.5–5.2)
ALT: 12 U/L (ref 0–35)
AST: 16 U/L (ref 0–37)
Alkaline Phosphatase: 59 U/L (ref 39–117)
BUN: 14 mg/dL (ref 6–23)
CALCIUM: 9.9 mg/dL (ref 8.4–10.5)
CHLORIDE: 102 meq/L (ref 96–112)
CO2: 33 meq/L — AB (ref 19–32)
Creatinine, Ser: 0.69 mg/dL (ref 0.40–1.20)
GFR: 107.83 mL/min (ref >60.00)
Glucose, Bld: 121 mg/dL — ABNORMAL HIGH (ref 70–99)
POTASSIUM: 3.8 meq/L (ref 3.5–5.1)
Sodium: 139 mEq/L (ref 135–145)
Total Bilirubin: 0.6 mg/dL (ref 0.2–1.2)
Total Protein: 7.4 g/dL (ref 6.0–8.3)

## 2016-09-16 LAB — LIPID PANEL
CHOL/HDL RATIO: 3
CHOLESTEROL: 226 mg/dL — AB (ref 0–200)
HDL: 65.3 mg/dL (ref >39.00)
LDL CALC: 142 mg/dL — AB (ref 0–99)
NonHDL: 160.31
TRIGLYCERIDES: 92 mg/dL (ref 0.0–149.0)
VLDL: 18.4 mg/dL (ref 0.0–40.0)

## 2016-09-16 LAB — HEMOGLOBIN A1C: Hgb A1c MFr Bld: 5.9 % (ref 4.6–6.5)

## 2016-09-19 ENCOUNTER — Ambulatory Visit (INDEPENDENT_AMBULATORY_CARE_PROVIDER_SITE_OTHER): Payer: Medicare HMO | Admitting: Family Medicine

## 2016-09-19 ENCOUNTER — Encounter: Payer: Self-pay | Admitting: Family Medicine

## 2016-09-19 VITALS — BP 130/74 | HR 90 | Temp 98.3°F | Ht 64.0 in | Wt 202.8 lb

## 2016-09-19 DIAGNOSIS — E119 Type 2 diabetes mellitus without complications: Secondary | ICD-10-CM

## 2016-09-19 DIAGNOSIS — I10 Essential (primary) hypertension: Secondary | ICD-10-CM | POA: Diagnosis not present

## 2016-09-19 DIAGNOSIS — Z23 Encounter for immunization: Secondary | ICD-10-CM | POA: Diagnosis not present

## 2016-09-19 DIAGNOSIS — E7800 Pure hypercholesterolemia, unspecified: Secondary | ICD-10-CM | POA: Diagnosis not present

## 2016-09-19 LAB — HM DIABETES FOOT EXAM

## 2016-09-19 NOTE — Assessment & Plan Note (Signed)
Well controlled with diet. 

## 2016-09-19 NOTE — Patient Instructions (Signed)
Increase exercise, work on weight loss and low cholesterol diet.  Could consider red yeast rice 600 mg 2 tabs twice daily.  Fish oil 1000 mg twice daily.

## 2016-09-19 NOTE — Assessment & Plan Note (Signed)
Well controlled. Continue current medication.  

## 2016-09-19 NOTE — Assessment & Plan Note (Addendum)
Inadequate control. Pt decided to remain off statin.Marland Kitchen She would like to use natural treatments instead.  Made rec for red yeast rice, fish oil and lifestyle changes.  Goal LDL at least < 100!

## 2016-09-19 NOTE — Progress Notes (Signed)
   Subjective:    Patient ID: Melanie Lang, female    DOB: 1945-09-19, 71 y.o.   MRN: [id number]  HPI  71 year old female presents for follow up chol and DM.  Diabetes:   Good control with diet. Lab Results  Component Value Date   HGBA1C 5.9 09/16/2016  Using medications without difficulties: Hypoglycemic episodes: Hyperglycemic episodes: Feet problems: Blood Sugars averaging: eye exam within last year:  Elevated Cholesterol:   She reports she took atorvastatin for a few days... Made her worried.. So stopped and wants to go garlic and  Lab Results  Component Value Date   CHOL 226 (H) 09/16/2016   HDL 65.30 09/16/2016   LDLCALC 142 (H) 09/16/2016   LDLDIRECT 132.7 12/07/2012   TRIG 92.0 09/16/2016   CHOLHDL 3 09/16/2016  Using medications without problems: Muscle aches:  Diet compliance: [place] on low chol diet. Exercise: Plans on starting at [facility]. Other complaints:  BP at goal on lisinopril, lasix, amlodipine  Blood pressure 130/74, pulse 90, temperature 98.3 F (36.8 C), temperature source Oral, height 5\' 4"  (1.626 m), weight 202 lb 12 oz (92 kg).   Review of Systems  Constitutional: Negative for fatigue and fever.  HENT: Negative for ear pain.   Eyes: Negative for pain.  Respiratory: Negative for chest tightness and shortness of breath.   Cardiovascular: Negative for chest pain, palpitations and leg swelling.  Gastrointestinal: Negative for abdominal pain.  Genitourinary: Negative for dysuria.       Objective:   Physical Exam  Constitutional: Vital signs are normal. She appears well-developed and well-nourished. She is cooperative.  Non-toxic appearance. She does not appear ill. No distress.  overwieght  HENT:  Head: Normocephalic.  Right Ear: Hearing, tympanic membrane, external ear and ear canal normal. Tympanic membrane is not erythematous, not retracted and not bulging.  Left Ear: Hearing, tympanic membrane, external ear and ear canal normal.  Tympanic membrane is not erythematous, not retracted and not bulging.  Nose: No mucosal edema or rhinorrhea. Right sinus exhibits no maxillary sinus tenderness and no frontal sinus tenderness. Left sinus exhibits no maxillary sinus tenderness and no frontal sinus tenderness.  Mouth/Throat: Uvula is midline, oropharynx is clear and moist and mucous membranes are normal.  Eyes: Pupils are equal, round, and reactive to light. Conjunctivae, EOM and lids are normal. Lids are everted and swept, no foreign bodies found.  Neck: Trachea normal and normal range of motion. Neck supple. Carotid bruit is not present. No thyroid mass and no thyromegaly present.  Cardiovascular: Normal rate, regular rhythm, S1 normal, S2 normal, normal heart sounds, intact distal pulses and normal pulses.  Exam reveals no gallop and no friction rub.   No murmur heard. Mild bilateral swelling ( pt forgot compression hose today.)  Pulmonary/Chest: Effort normal and breath sounds normal. No tachypnea. No respiratory distress. She has no decreased breath sounds. She has no wheezes. She has no rhonchi. She has no rales.  Abdominal: Soft. Normal appearance and bowel sounds are normal. There is no tenderness.  Neurological: She is alert.  Skin: Skin is warm, dry and intact. No rash noted.  Psychiatric: Her speech is normal and behavior is normal. Judgment and thought content normal. Her mood appears not anxious. Cognition and memory are normal. She does not exhibit a depressed mood.          Assessment & Plan:

## 2016-09-25 ENCOUNTER — Ambulatory Visit: Payer: Medicare HMO | Admitting: Podiatry

## 2016-10-01 ENCOUNTER — Encounter: Payer: Self-pay | Admitting: Podiatry

## 2016-10-01 ENCOUNTER — Ambulatory Visit (INDEPENDENT_AMBULATORY_CARE_PROVIDER_SITE_OTHER): Payer: Medicare HMO | Admitting: Podiatry

## 2016-10-01 DIAGNOSIS — B351 Tinea unguium: Secondary | ICD-10-CM | POA: Diagnosis not present

## 2016-10-01 DIAGNOSIS — M79674 Pain in right toe(s): Secondary | ICD-10-CM

## 2016-10-01 DIAGNOSIS — M79675 Pain in left toe(s): Secondary | ICD-10-CM

## 2016-10-01 NOTE — Progress Notes (Signed)
Subjective:    Patient ID: Melanie Lang, female   DOB: 71 y.o.   MRN: [id number]   HPI patient presents with thick nailbeds 1-5 both feet that are painful when pressed and she cannot cut herself    ROS      Objective:  Physical Exam neurovascular status intact with thick yellow brittle toenails 1-5 both feet that are incurvated in the corners and sore     Assessment:    Chronic mycotic nail infection 1-5 both feet with pain     Plan:   Debris painful nailbeds 1-5 both feet with no iatrogenic bleeding noted

## 2016-10-29 ENCOUNTER — Other Ambulatory Visit: Payer: Self-pay | Admitting: Family Medicine

## 2016-12-04 ENCOUNTER — Ambulatory Visit
Admission: RE | Admit: 2016-12-04 | Discharge: 2016-12-04 | Disposition: A | Discharge status: Home / Self Care | Payer: Medicare HMO | Source: Ambulatory Visit | Attending: Family Medicine | Admitting: Family Medicine

## 2016-12-04 ENCOUNTER — Encounter: Payer: Self-pay | Admitting: Family Medicine

## 2016-12-04 DIAGNOSIS — M810 Age-related osteoporosis without current pathological fracture: Secondary | ICD-10-CM | POA: Insufficient documentation

## 2016-12-04 DIAGNOSIS — Z780 Asymptomatic menopausal state: Secondary | ICD-10-CM | POA: Diagnosis not present

## 2016-12-04 DIAGNOSIS — Z1231 Encounter for screening mammogram for malignant neoplasm of breast: Secondary | ICD-10-CM

## 2016-12-04 DIAGNOSIS — M8580 Other specified disorders of bone density and structure, unspecified site: Secondary | ICD-10-CM

## 2016-12-04 DIAGNOSIS — E2839 Other primary ovarian failure: Secondary | ICD-10-CM

## 2016-12-05 ENCOUNTER — Telehealth: Payer: Self-pay | Admitting: Family Medicine

## 2016-12-05 NOTE — Telephone Encounter (Signed)
Patient called back to review her bone density results- patient informed of results- she is osteoporotic and that the provider would like to discuss her options with her. Patient has follow up appointment in February but I told her she may need a consult appointment to discuss management. Will forward to office to call and schedule her.

## 2016-12-05 NOTE — Telephone Encounter (Signed)
Valli Glance RN also noted:Not sure what type of appointment needed ( consult)- please call patient to schedule (Routing comment

## 2016-12-05 NOTE — Telephone Encounter (Signed)
Left message for Melanie Lang to return my call to schedule an office visit with Dr. Diona Browner to discuss osteoporosis treatment options if she is interested in treatment.  Otherwise advised her to do weight bearing exercise, calcium in diet and vit D supplement 400 IU 1-2 times daily.  If patient calls back please schedule a 15 minute office visit with Dr. Diona Browner.

## 2016-12-08 ENCOUNTER — Telehealth: Payer: Self-pay

## 2016-12-08 MED ORDER — LISINOPRIL 40 MG PO TABS: 40.0000 mg | ORAL_TABLET | Freq: Every day | ORAL | 0 refills | Status: DC

## 2016-12-08 MED ORDER — AMLODIPINE BESYLATE 10 MG PO TABS: 10.0000 mg | ORAL_TABLET | Freq: Every day | ORAL | 0 refills | Status: DC

## 2016-12-08 NOTE — Telephone Encounter (Signed)
Spoke with pt and wants med sent to [facility]. Advised pt done.

## 2016-12-08 NOTE — Telephone Encounter (Signed)
See pt.'s request below.    Copied from [place] [id number]. Topic: Quick Communication - See Telephone Encounter >> Dec 08, 2016  2:32 PM Bea Graff, NT wrote: CRM for notification. See Telephone encounter for: Patient is out of town and forgot all her medicines that she needed to take back home and wanted to see if Dr. Diona Browner could call her in enough of each rx to last until she is home on Monday? The medications she is needing refilled are lisopril and Norvasc. She will be using [facility] on [address] in [place], [place]. Phone number: [phone]  12/08/16.

## 2017-01-01 ENCOUNTER — Other Ambulatory Visit: Payer: Medicare HMO

## 2017-01-06 ENCOUNTER — Encounter: Payer: Self-pay | Admitting: Podiatry

## 2017-01-06 ENCOUNTER — Ambulatory Visit (INDEPENDENT_AMBULATORY_CARE_PROVIDER_SITE_OTHER): Payer: Medicare HMO | Admitting: Podiatry

## 2017-01-06 DIAGNOSIS — M79605 Pain in left leg: Secondary | ICD-10-CM

## 2017-01-06 DIAGNOSIS — B351 Tinea unguium: Secondary | ICD-10-CM | POA: Diagnosis not present

## 2017-01-06 DIAGNOSIS — M79675 Pain in left toe(s): Secondary | ICD-10-CM

## 2017-01-06 DIAGNOSIS — M79674 Pain in right toe(s): Secondary | ICD-10-CM

## 2017-01-06 DIAGNOSIS — M79604 Pain in right leg: Secondary | ICD-10-CM

## 2017-01-06 NOTE — Progress Notes (Signed)
She presents today for a follow-up of her painful elongated toenails bilaterally.  She states that she is doing very well.  Has noticed that she is to started to develop a bunion on the right foot over the past several months.  States that the one on the left foot is stable and has had 2 knee surgeries on the left knee.  Objective: Pulses remain palpable neurologic sensorium is unchanged muscle strength is normal symmetrical bilateral.  Toenails are long thick yellow dystrophic clinically mycotic painful on palpation as well as nail debridement.  Assessment: Pain in limb secondary to onychomycosis hallux valgus deformities bilateral.  Plan: Debridement of toenails bilateral.  Follow-up with her in 6 months

## 2017-02-14 ENCOUNTER — Emergency Department (HOSPITAL_COMMUNITY): Payer: Medicare HMO

## 2017-02-14 ENCOUNTER — Emergency Department (HOSPITAL_COMMUNITY)
Admission: EM | Admit: 2017-02-14 | Discharge: 2017-02-14 | Disposition: A | Discharge status: Home / Self Care | Payer: Medicare HMO | Attending: Emergency Medicine | Admitting: Emergency Medicine

## 2017-02-14 ENCOUNTER — Encounter (HOSPITAL_COMMUNITY): Payer: Self-pay | Admitting: Emergency Medicine

## 2017-02-14 DIAGNOSIS — R1011 Right upper quadrant pain: Secondary | ICD-10-CM | POA: Diagnosis present

## 2017-02-14 DIAGNOSIS — Z91010 Allergy to peanuts: Secondary | ICD-10-CM | POA: Insufficient documentation

## 2017-02-14 DIAGNOSIS — J45909 Unspecified asthma, uncomplicated: Secondary | ICD-10-CM | POA: Insufficient documentation

## 2017-02-14 DIAGNOSIS — N200 Calculus of kidney: Secondary | ICD-10-CM | POA: Diagnosis not present

## 2017-02-14 DIAGNOSIS — E119 Type 2 diabetes mellitus without complications: Secondary | ICD-10-CM | POA: Insufficient documentation

## 2017-02-14 DIAGNOSIS — Z7982 Long term (current) use of aspirin: Secondary | ICD-10-CM | POA: Insufficient documentation

## 2017-02-14 DIAGNOSIS — R109 Unspecified abdominal pain: Secondary | ICD-10-CM | POA: Diagnosis not present

## 2017-02-14 DIAGNOSIS — Z79899 Other long term (current) drug therapy: Secondary | ICD-10-CM | POA: Insufficient documentation

## 2017-02-14 HISTORY — DX: Calculus of kidney: N20.0

## 2017-02-14 LAB — URINALYSIS, ROUTINE W REFLEX MICROSCOPIC
BILIRUBIN URINE: NEGATIVE
Glucose, UA: NEGATIVE mg/dL
HGB URINE DIPSTICK: NEGATIVE
KETONES UR: NEGATIVE mg/dL
Leukocytes, UA: NEGATIVE
Nitrite: NEGATIVE
Protein, ur: NEGATIVE mg/dL
SPECIFIC GRAVITY, URINE: 1.008 (ref 1.005–1.030)
pH: 7 (ref 5.0–8.0)

## 2017-02-14 LAB — BASIC METABOLIC PANEL
Anion gap: 11 (ref 5–15)
BUN: 12 mg/dL (ref 6–20)
CALCIUM: 9.5 mg/dL (ref 8.9–10.3)
CHLORIDE: 105 mmol/L (ref 101–111)
CO2: 23 mmol/L (ref 22–32)
CREATININE: 0.81 mg/dL (ref 0.44–1.00)
GFR calc Af Amer: >60 mL/min (ref >60)
GFR calc non Af Amer: >60 mL/min (ref >60)
Glucose, Bld: 127 mg/dL — ABNORMAL HIGH (ref 65–99)
Potassium: 3.7 mmol/L (ref 3.5–5.1)
SODIUM: 139 mmol/L (ref 135–145)

## 2017-02-14 MED ORDER — IBUPROFEN 800 MG PO TABS
800.0000 mg | ORAL_TABLET | Freq: Once | ORAL | Status: AC
  Administered 2017-02-14: 800 mg via ORAL
  Filled 2017-02-14: qty 1

## 2017-02-14 MED ORDER — IBUPROFEN 800 MG PO TABS: 800.0000 mg | ORAL_TABLET | Freq: Three times a day (TID) | ORAL | 0 refills | Status: AC | PRN

## 2017-02-14 NOTE — ED Triage Notes (Signed)
Pt to ED c/o sudden onset R flank pain about 5am today, states she had kidney stones 10 years ago and this feels the same. Also endorses urinary frequency. Denies fevers.

## 2017-02-14 NOTE — ED Provider Notes (Signed)
[facility] EMERGENCY DEPARTMENT Provider Note   CSN: [id number] Arrival date & time: 02/14/17  1053     History   Chief Complaint Chief Complaint  Patient presents with  . Flank Pain    HPI Melanie Lang is a 72 y.o. female with history of T2DM, GERD, HLD, HTN, nephrolithiasis, presenting with R flank pain, sudden onset around 4 o'clock this AM. She endorses sharp pain radiating to her groin. She has had "pressure" but not pain or burning with urination. No N/V/D/C. No fevers. No gross hematuria, no vaginal discharge or pruritis.   Past Medical History:  Diagnosis Date  . Allergy   . Arthritis   . Asthma   . Diabetes mellitus without complication ([place])    borderline controlled with diet   . GERD (gastroesophageal reflux disease)   . Hyperlipidemia   . Hypertension   . Nephrolithiasis   . Osteopenia 04/08/2011   T-1.9 in 03/2011     Patient Active Problem List   Diagnosis Date Noted  . Osteoporosis 12/04/2016  . 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 OA (osteoarthritis) of knee 07/04/2015  . Pre-operative cardiovascular examination 06/12/2015  . Counseling regarding end of life decision making 01/06/2014  . Vitamin D deficiency 12/21/2012  . Right carpal tunnel syndrome 11/29/2010  . CAROTID BRUIT, RIGHT, DIFFUSE 10/01/2009  . MYOCARDIAL PERFUSION SCAN, WITH STRESS TEST, ABNORMAL 06/27/2009  . Gout, chronic, without tophus 12/07/2008  . OTH PROLAPS VAG WALLS W/O MENTION UTERN PROLAPS 04/12/2008  . GASTRITIS 11/22/2007  . Diabetes mellitus with no complication ([place]) 62/94/7654  . Hyperlipidemia 09/01/2006  . Essential hypertension, benign 09/01/2006  . ALLERGIC RHINITIS 09/01/2006  . ASTHMA 09/01/2006  . OSTEOARTHRITIS 09/01/2006    Past Surgical History:  Procedure Laterality Date  . ABDOMINAL HYSTERECTOMY    . BILATERAL CARPAL TUNNEL RELEASE    . KNEE SURGERY    . TONSILLECTOMY    . TOTAL KNEE REVISION Left 07/04/2015     Procedure: LEFT TOTAL KNEE REVISION;  Surgeon: Gaynelle Arabian, MD;  Location: [facility];  Service: Orthopedics;  Laterality: Left;    OB History    No data available      Home Medications    Prior to Admission medications   Medication Sig Start Date End Date Taking? Authorizing Provider  amLODipine (NORVASC) 10 MG tablet Take 1 tablet (10 mg total) daily by mouth. 12/08/16  Yes Bedsole, Amy E, MD  amoxicillin (AMOXIL) 500 MG capsule Take 2,000 mg by mouth daily as needed (before dental apptment).   Yes [provider]  aspirin EC 81 MG tablet Take 81 mg by mouth daily.   Yes [provider]  cholecalciferol (VITAMIN D) 1000 units tablet Take 1,000 Units by mouth daily.   Yes [provider]  COLCRYS 0.6 MG tablet TAKE 1 TABLET BY MOUTH 2 TIMES DAILY. Patient taking differently: TAKE 1 TABLET  (0.6mg ) BY MOUTH 2 TIMES DAILY prn gout flare 11/05/15  Yes Bedsole, Amy E, MD  furosemide (LASIX) 20 MG tablet TAKE 1 TABLET ONE TIME DAILY AS NEEDED FOR  FLUID Patient taking differently: TAKE 1 TABLET (20 mg ) ONE TIME DAILY AS NEEDED FOR  FLUID 04/07/16  Yes Bedsole, Amy E, MD  lisinopril (PRINIVIL,ZESTRIL) 40 MG tablet Take 1 tablet (40 mg total) daily by mouth. 12/08/16  Yes Bedsole, Amy E, MD  meloxicam (MOBIC) 15 MG tablet Take 1 tablet (15 mg total) by mouth daily. Patient taking differently: Take 15  mg by mouth daily as needed for pain.  06/04/16  Yes Regal, Tamala Fothergill, DPM  Naphazoline-Pheniramine (OPCON-A OP) Place 1 drop into both eyes daily as needed (For allergies.).   Yes [provider]  omeprazole (PRILOSEC) 20 MG capsule Take 20 mg by mouth daily as needed (acid reflux).   Yes [provider]  VITAMIN E PO Take 1 capsule by mouth daily.   Yes [provider]  Alcohol Swabs (B-D SINGLE USE SWABS REGULAR) PADS Use to check blood sugar daily.  Dx: E11.9 03/01/15   Bedsole, Amy E, MD  Blood Glucose Calibration (TRUE METRIX LEVEL 1) Low SOLN  USE AS DIRECTED TO CHECK CONTROLS ON GLUCOSE METER.  04/07/16   Bedsole, Amy E, MD  Blood Glucose Monitoring Suppl (TRUE METRIX AIR GLUCOSE METER) DEVI 1 each by Does not apply route daily. E11.9 03/01/15   Bedsole, Amy E, MD  Cholecalciferol (VITAMIN D3) 50000 units CAPS 1 cap weekly x 12 weeks Patient not taking: Reported on 02/14/2017 03/05/16   Jinny Sanders, MD  ibuprofen (ADVIL,MOTRIN) 800 MG tablet Take 1 tablet (800 mg total) by mouth every 8 (eight) hours as needed. 02/14/17   Everrett Coombe, MD  TRUE METRIX BLOOD GLUCOSE TEST test strip CHECK BLOOD SUGAR ONE TIME DAILY 04/07/16   Jinny Sanders, MD  TRUEPLUS LANCETS 33G MISC CHECK BLOOD SUGAR EVERY DAY 04/07/16   Jinny Sanders, MD    Family History Family History  Problem Relation Age of Onset  . Asthma Mother   . Heart failure Father   . Stroke Father   . Cancer Maternal Grandmother        colon  . Diabetes Maternal Grandfather     Social History Social History   Tobacco Use  . Smoking status: Never Smoker  . Smokeless tobacco: Never Used  Substance Use Topics  . Alcohol use: No    Alcohol/week: 0.0 oz  . Drug use: No     Allergies   Shellfish allergy; Eggs or egg-derived products; and Peanuts [peanut oil]   Review of Systems Review of Systems  Constitutional: Negative for diaphoresis, fatigue and fever.  HENT: Negative for congestion, rhinorrhea and sore throat.   Respiratory: Negative for shortness of breath, wheezing and stridor.   Cardiovascular: Negative for chest pain.  Gastrointestinal: Negative for abdominal distention, abdominal pain, diarrhea, nausea and vomiting.  Genitourinary: Negative for dysuria, frequency, hematuria, vaginal discharge and vaginal pain.     Physical Exam Updated Vital Signs BP (!) 183/97 (BP Location: Right Arm)   Pulse (!) 102   Temp 98.3 F (36.8 C) (Oral)   Resp 18   SpO2 97%   Physical Exam  Constitutional: She is oriented to person, place, and time. No distress.  HENT:   Mouth/Throat: No oropharyngeal exudate.  Cardiovascular: Regular rhythm. Exam reveals no gallop and no friction rub.  No murmur heard. Mildly tachycardic  Pulmonary/Chest: No stridor. She has no wheezes. She has no rales.  Abdominal: Soft. She exhibits no distension. There is no tenderness. There is no guarding.  Genitourinary:  Genitourinary Comments: No CVA tenderness  Musculoskeletal: She exhibits no edema or deformity.  Neurological: She is alert and oriented to person, place, and time.  Skin: Skin is warm and dry. Capillary refill takes less than 2 seconds. She is not diaphoretic.     ED Treatments / Results  Labs (all labs ordered are listed, but only abnormal results are displayed) Labs Reviewed  URINALYSIS, Marissa  MICROSCOPIC - Abnormal; Notable for the following components:      Result Value   Color, Urine STRAW (*)    All other components within normal limits  BASIC METABOLIC PANEL - Abnormal; Notable for the following components:   Glucose, Bld 127 (*)    All other components within normal limits    EKG  EKG Interpretation None       Radiology US Renal  Result Date: 02/14/2017 CLINICAL DATA:  72 year old female with a history of flank pain since 4 a.m. EXAM: RENAL / URINARY TRACT ULTRASOUND COMPLETE COMPARISON:  None. FINDINGS: Right Kidney: Length: 12.5 cm. Hyperechoic focus at the inferior collecting system of the right kidney measures approximately 2 cm. Dilation of the collecting system including pelvicaliectasis and the renal pelvis. Flow confirmed in the hilum of the right kidney. Left Kidney: Length: 12.2 cm. Anechoic lesion of the left kidney cortex with thin border and through transmission measures 16 mm x 18 mm x 18 mm. Mild dilation of the left collecting system. Bladder: Appears normal for degree of bladder distention. IMPRESSION: Right kidney with inferior collecting system 2 cm calculus, and associated mild a moderate  hydronephrosis/pelvicaliectasis. If there is concern for obstructing ureteral stone, CT would be recommended. Left kidney demonstrates mild hydronephrosis/pelvicaliectasis. No left-sided stones identified. Left-sided simple cyst Electronically Signed   By: Corrie Mckusick D.O.   On: 02/14/2017 13:13    Procedures Procedures (including critical care time)  Medications Ordered in ED Medications  ibuprofen (ADVIL,MOTRIN) tablet 800 mg (800 mg Oral Given 02/14/17 1213)    Initial Impression / Assessment and Plan / ED Course  I have reviewed the triage vital signs and the nursing notes.  Pertinent labs & imaging results that were available during my care of the patient were reviewed by me and considered in my medical decision making (see chart for details).    72 yo presenting with flank pain radiating to the groin, most consistent with nephrolithiasis. UA was negative for blood. Renal ultrasound showed a 2 cm stone with moderate pyelonephritis. Patient's pain was well-controlled with ibuprofen and she continued to be stable and well-appearing.  Kidney function was WNL. She was considered safe for discharge. Return precautions were discussed.  Final Clinical Impressions(s) / ED Diagnoses   Final diagnoses:  Kidney stone    ED Discharge Orders        Ordered    ibuprofen (ADVIL,MOTRIN) 800 MG tablet  Every 8 hours PRN     02/14/17 1402       Everrett Coombe, MD 02/14/17 1548    Carmin Muskrat, MD 02/15/17 [phone]

## 2017-02-14 NOTE — Discharge Instructions (Addendum)
You were seen in the emergency department for a kidney stone. Please continue to use ibuprofen at home for pain, and drink plenty of water.  Reasons to return to care would be if you have worsening pain that is not resolving, or if you are unable to stay hydrated by mouth.  Please schedule follow up to be seen by your regular doctor within the next 5-7 days.

## 2017-02-14 NOTE — ED Notes (Signed)
Called lab.  They are running the bmp now.

## 2017-02-24 ENCOUNTER — Encounter: Payer: Self-pay | Admitting: Family Medicine

## 2017-02-24 ENCOUNTER — Ambulatory Visit: Payer: Medicare HMO

## 2017-02-24 ENCOUNTER — Ambulatory Visit (INDEPENDENT_AMBULATORY_CARE_PROVIDER_SITE_OTHER): Payer: Medicare HMO | Admitting: Family Medicine

## 2017-02-24 VITALS — BP 160/88 | HR 76 | Temp 98.3°F | Ht 64.0 in | Wt 198.2 lb

## 2017-02-24 DIAGNOSIS — Z0000 Encounter for general adult medical examination without abnormal findings: Secondary | ICD-10-CM

## 2017-02-24 DIAGNOSIS — M1A9XX0 Chronic gout, unspecified, without tophus (tophi): Secondary | ICD-10-CM

## 2017-02-24 DIAGNOSIS — E559 Vitamin D deficiency, unspecified: Secondary | ICD-10-CM

## 2017-02-24 DIAGNOSIS — Z0001 Encounter for general adult medical examination with abnormal findings: Secondary | ICD-10-CM | POA: Diagnosis not present

## 2017-02-24 DIAGNOSIS — M810 Age-related osteoporosis without current pathological fracture: Secondary | ICD-10-CM | POA: Diagnosis not present

## 2017-02-24 DIAGNOSIS — E119 Type 2 diabetes mellitus without complications: Secondary | ICD-10-CM

## 2017-02-24 DIAGNOSIS — E7800 Pure hypercholesterolemia, unspecified: Secondary | ICD-10-CM | POA: Diagnosis not present

## 2017-02-24 DIAGNOSIS — I10 Essential (primary) hypertension: Secondary | ICD-10-CM | POA: Diagnosis not present

## 2017-02-24 LAB — COMPREHENSIVE METABOLIC PANEL
ALBUMIN: 4.1 g/dL (ref 3.5–5.2)
ALT: 11 U/L (ref 0–35)
AST: 14 U/L (ref 0–37)
Alkaline Phosphatase: 62 U/L (ref 39–117)
BILIRUBIN TOTAL: 0.6 mg/dL (ref 0.2–1.2)
BUN: 13 mg/dL (ref 6–23)
CALCIUM: 9.6 mg/dL (ref 8.4–10.5)
CO2: 31 mEq/L (ref 19–32)
CREATININE: 0.67 mg/dL (ref 0.40–1.20)
Chloride: 102 mEq/L (ref 96–112)
GFR: 111.42 mL/min (ref >60.00)
Glucose, Bld: 98 mg/dL (ref 70–99)
Potassium: 3.6 mEq/L (ref 3.5–5.1)
Sodium: 141 mEq/L (ref 135–145)
TOTAL PROTEIN: 7.6 g/dL (ref 6.0–8.3)

## 2017-02-24 LAB — LIPID PANEL
CHOLESTEROL: 220 mg/dL — AB (ref 0–200)
HDL: 69.8 mg/dL (ref >39.00)
LDL Cholesterol: 136 mg/dL — ABNORMAL HIGH (ref 0–99)
NonHDL: 150.31
TRIGLYCERIDES: 71 mg/dL (ref 0.0–149.0)
Total CHOL/HDL Ratio: 3
VLDL: 14.2 mg/dL (ref 0.0–40.0)

## 2017-02-24 LAB — HEMOGLOBIN A1C: HEMOGLOBIN A1C: 5.9 % (ref 4.6–6.5)

## 2017-02-24 LAB — VITAMIN D 25 HYDROXY (VIT D DEFICIENCY, FRACTURES): VITD: 36.05 ng/mL (ref 30.00–100.00)

## 2017-02-24 LAB — URIC ACID: URIC ACID, SERUM: 5.1 mg/dL (ref 2.4–7.0)

## 2017-02-24 LAB — HM DIABETES FOOT EXAM

## 2017-02-24 NOTE — Assessment & Plan Note (Signed)
Due for re-eval. 

## 2017-02-24 NOTE — Progress Notes (Signed)
I reviewed health advisor's note, was available for consultation, and agree with documentation and plan.  

## 2017-02-24 NOTE — Progress Notes (Signed)
Subjective:   Melanie Lang is a 72 y.o. female who presents for Medicare Annual (Subsequent) preventive examination.  Review of Systems:  N/A Cardiac Risk Factors include: advanced age (>51men, >62 women);diabetes mellitus;obesity (BMI >30kg/m2);hypertension     Objective:     Vitals: BP (!) 160/88   Pulse 76   Temp 98.3 F (36.8 C) (Oral)   Ht 5\' 4"  (1.626 m)   Wt 198 lb 4 oz (89.9 kg)   BMI 34.03 kg/m   Body mass index is 34.03 kg/m.  Advanced Directives 02/24/2017 02/14/2017 02/21/2016 07/04/2015 06/26/2015 12/19/2013  Does Patient Have a Medical Advance Directive? Yes Yes Yes Yes No;Yes No  Type of Paramedic of [place];Living will Living will;Healthcare Power of [place];Living will [place];Living will [place];Living will -  Does patient want to make changes to medical advance directive? - No - Patient declined - No - Patient declined No - Patient declined -  Copy of [place] in Chart? No - copy requested No - copy requested No - copy requested No - copy requested No - copy requested -  Would patient like information on creating a medical advance directive? No - Patient declined No - Patient declined - - - No - patient declined information    Tobacco Social History   Tobacco Use  Smoking Status Never Smoker  Smokeless Tobacco Never Used     Counseling given: No   Clinical Intake:  Pre-visit preparation completed: Yes  Pain : No/denies pain Pain Score: 0-No pain     Nutritional Status: BMI > 30  Obese Nutritional Risks: None Diabetes: Yes CBG done?: No Did pt. bring in CBG monitor from home?: No  How often do you need to have someone help you when you read instructions, pamphlets, or other written materials from your doctor or pharmacy?: 1 - Never What is the last grade level you completed in school?: 12th grade  Interpreter Needed?:  No  Comments: pt lives with spouse Information entered by :: LPinson, LPN  Past Medical History:  Diagnosis Date  . Allergy   . Arthritis   . Asthma   . Diabetes mellitus without complication ([place])    borderline controlled with diet   . GERD (gastroesophageal reflux disease)   . Hyperlipidemia   . Hypertension   . Nephrolithiasis   . Osteopenia 04/08/2011   T-1.9 in 03/2011    Past Surgical History:  Procedure Laterality Date  . ABDOMINAL HYSTERECTOMY    . BILATERAL CARPAL TUNNEL RELEASE    . KNEE SURGERY    . TONSILLECTOMY    . 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 Asthma Mother   . Heart failure Father   . Stroke Father   . Cancer Maternal Grandmother        colon  . Diabetes Maternal Grandfather    Social History   Socioeconomic History  . Marital status: Married    Spouse name: None  . Number of children: None  . Years of education: None  . Highest education level: None  Social Needs  . Financial resource strain: None  . Food insecurity - worry: None  . Food insecurity - inability: None  . Transportation needs - medical: None  . Transportation  needs - non-medical: None  Occupational History  . Occupation: semiretired(babysitter)    Employer: unemployed  Tobacco Use  . Smoking status: Never Smoker  . Smokeless tobacco: Never Used  Substance and Sexual Activity  . Alcohol use: No    Alcohol/week: 0.0 oz  . Drug use: No  . Sexual activity: No  Other Topics Concern  . None  Social History Narrative   Regular exercise--yes, walks   Diet: healthy, veggies, fruit, avoid salts                Outpatient Encounter Medications as of 02/24/2017  Medication Sig  . Alcohol Swabs (B-D SINGLE USE SWABS REGULAR) PADS Use to check blood sugar daily.  Dx: E11.9  . amLODipine (NORVASC)  10 MG tablet Take 1 tablet (10 mg total) daily by mouth.  Marland Kitchen amoxicillin (AMOXIL) 500 MG capsule Take 2,000 mg by mouth daily as needed (before dental apptment).  Marland Kitchen aspirin EC 81 MG tablet Take 81 mg by mouth daily.  . Blood Glucose Calibration (TRUE METRIX LEVEL 1) Low SOLN USE AS DIRECTED TO CHECK CONTROLS ON GLUCOSE METER.   . Blood Glucose Monitoring Suppl (TRUE METRIX AIR GLUCOSE METER) DEVI 1 each by Does not apply route daily. E11.9  . cholecalciferol (VITAMIN D) 1000 units tablet Take 1,000 Units by mouth daily.  Marland Kitchen COLCRYS 0.6 MG tablet TAKE 1 TABLET BY MOUTH 2 TIMES DAILY. (Patient taking differently: TAKE 1 TABLET  (0.6mg ) BY MOUTH 2 TIMES DAILY prn gout flare)  . furosemide (LASIX) 20 MG tablet TAKE 1 TABLET ONE TIME DAILY AS NEEDED FOR  FLUID (Patient taking differently: TAKE 1 TABLET (20 mg ) ONE TIME DAILY AS NEEDED FOR  FLUID)  . ibuprofen (ADVIL,MOTRIN) 800 MG tablet Take 1 tablet (800 mg total) by mouth every 8 (eight) hours as needed.  Marland Kitchen lisinopril (PRINIVIL,ZESTRIL) 40 MG tablet Take 1 tablet (40 mg total) daily by mouth.  . meloxicam (MOBIC) 15 MG tablet Take 1 tablet (15 mg total) by mouth daily. (Patient taking differently: Take 15 mg by mouth daily as needed for pain. )  . Naphazoline-Pheniramine (OPCON-A OP) Place 1 drop into both eyes daily as needed (For allergies.).  Marland Kitchen omeprazole (PRILOSEC) 20 MG capsule Take 20 mg by mouth daily as needed (acid reflux).  . TRUE METRIX BLOOD GLUCOSE TEST test strip CHECK BLOOD SUGAR ONE TIME DAILY  . TRUEPLUS LANCETS 33G MISC CHECK BLOOD SUGAR EVERY DAY  . VITAMIN E PO Take 1 capsule by mouth daily.   No facility-administered encounter medications on file as of 02/24/2017.     Activities of Daily Living In your present state of health, do you have any difficulty performing the following activities: 02/24/2017  Hearing? N  Vision? Y  Comment cataract in left eye  Difficulty concentrating or making decisions? N  Walking or climbing  stairs? N  Dressing or bathing? N  Doing errands, shopping? N  Preparing Food and eating ? N  Using the Toilet? N  In the past six months, have you accidently leaked urine? N  Do you have problems with loss of bowel control? N  Managing your Medications? N  Managing your Finances? N  Housekeeping or managing your Housekeeping? N  Some recent data might be hidden    Patient Care Team: Jinny Sanders, MD as PCP - General    Assessment:   This is a routine wellness examination for [place].   Hearing Screening   125Hz  250Hz  500Hz  1000Hz  2000Hz  3000Hz   4000Hz  6000Hz  8000Hz   Right ear:   0 40 40  40    Left ear:   0 0 40  40    Vision Screening Comments: Vision exam in June 2018   Exercise Activities and Dietary recommendations Current Exercise Habits: Home exercise routine, Type of exercise: walking, Time (Minutes): 20, Frequency (Times/Week): 7, Weekly Exercise (Minutes/Week): 140, Intensity: Moderate, Exercise limited by: None identified  Goals    . Follow up with Primary Care Provider     Starting 02/24/2017, I will continue to take medications as prescribed and to keep appointments with PCP as scheduled.        Fall Risk Fall Risk  02/24/2017 02/21/2016 02/19/2015 01/06/2014 12/21/2012  Falls in the past year? No No No No No   Depression Screen PHQ 2/9 Scores 02/24/2017 02/21/2016 02/19/2015 01/06/2014  PHQ - 2 Score 0 0 0 0  PHQ- 9 Score 0 - - -     Cognitive Function MMSE - Mini Mental State Exam 02/24/2017 02/21/2016  Orientation to time 5 5  Orientation to Place 5 5  Registration 3 3  Attention/ Calculation 0 0  Recall 3 3  Language- name 2 objects 0 0  Language- repeat 1 1  Language- follow 3 step command 3 3  Language- read & follow direction 0 0  Write a sentence 0 0  Copy design 0 0  Total score 20 20     PLEASE NOTE: A Mini-Cog screen was completed. Maximum score is 20. A value of 0 denotes this part of [place] MMSE was not completed or the patient failed this  part of the Mini-Cog screening.   Mini-Cog Screening Orientation to Time - Max 5 pts Orientation to Place - Max 5 pts Registration - Max 3 pts Recall - Max 3 pts Language Repeat - Max 1 pts Language Follow 3 Step Command - Max 3 pts     Immunization History  Administered Date(s) Administered  . Pneumococcal Conjugate-13 09/19/2016  . Pneumococcal Polysaccharide-23 12/08/2011  . Td 01/21/2000    Screening Tests Health Maintenance  Topic Date Due  . OPHTHALMOLOGY EXAM  02/24/2018 (Originally 11/20/2016)  . TETANUS/TDAP  01/20/2020 (Originally 01/20/2010)  . HEMOGLOBIN A1C  08/24/2017  . FOOT EXAM  09/19/2017  . MAMMOGRAM  12/05/2018  . COLONOSCOPY  01/05/2019  . DEXA SCAN  Completed  . Hepatitis C Screening  Completed  . PNA vac Low Risk Adult  Completed      Plan:     I have personally reviewed, addressed, and noted the following in the patient's chart:  A. Medical and social history B. Use of alcohol, tobacco or illicit drugs  C. Current medications and supplements D. Functional ability and status E.  Nutritional status F.  Physical activity G. Advance directives H. List of other physicians I.  Hospitalizations, surgeries, and ER visits in previous 12 months J.  [employer] to include hearing, vision, cognitive, depression L. Referrals and appointments - none  In addition, I have reviewed and discussed with patient certain preventive protocols, quality metrics, and best practice recommendations. A written personalized care plan for preventive services as well as general preventive health recommendations were provided to patient.  See attached scanned questionnaire for additional information.   Signed,   Lindell Noe, MHA, BS, LPN Health Coach

## 2017-02-24 NOTE — Assessment & Plan Note (Signed)
Discussed treatment options. She will review info given and call if medication initiation desired with bisphosphonate.  She does have GERD history. Repeat DEXA in 2 years.

## 2017-02-24 NOTE — Assessment & Plan Note (Signed)
Above goal today but has not been taking med in last few days until 1-2 hours ago. Restart medication and call with measurements in next 1-2 weeks. Decreased salt in diet. MAy need med adjustment.

## 2017-02-24 NOTE — Patient Instructions (Addendum)
Ms. Favero , Thank you for taking time to come for your Medicare Wellness Visit. I appreciate your ongoing commitment to your health goals. Please review the following plan we discussed and let me know if I can assist you in the future.   These are the goals we discussed: Goals    . Follow up with Primary Care Provider     Starting 02/24/2017, I will continue to take medications as prescribed and to keep appointments with PCP as scheduled.        This is a list of the screening recommended for you and due dates:  Health Maintenance  Topic Date Due  . Eye exam for diabetics  02/24/2018*  . Tetanus Vaccine  01/20/2020*  . Hemoglobin A1C  08/24/2017  . Complete foot exam   09/19/2017  . Mammogram  12/05/2018  . Colon Cancer Screening  01/05/2019  . DEXA scan (bone density measurement)  Completed  .  Hepatitis C: One time screening is recommended by Center for Disease Control  (CDC) for  adults born from [birth year] through 1965.   Completed  . Pneumonia vaccines  Completed  *Topic was postponed. The date shown is not the original due date.   Preventive Care for Adults  A healthy lifestyle and preventive care can promote health and wellness. Preventive health guidelines for adults include the following key practices.  . A routine yearly physical is a good way to check with your health care provider about your health and preventive screening. It is a chance to share any concerns and updates on your health and to receive a thorough exam.  . Visit your dentist for a routine exam and preventive care every 6 months. Brush your teeth twice a day and floss once a day. Good oral hygiene prevents tooth decay and gum disease.  . The frequency of eye exams is based on your age, health, family medical history, use  of contact lenses, and other factors. Follow your health care provider's recommendations for frequency of eye exams.  . Eat a healthy diet. Foods like vegetables, fruits, whole grains, low-fat  dairy products, and lean protein foods contain the nutrients you need without too many calories. Decrease your intake of foods high in solid fats, added sugars, and salt. Eat the right amount of calories for you. Get information about a proper diet from your health care provider, if necessary.  . Regular physical exercise is one of the most important things you can do for your health. Most adults should get at least 150 minutes of moderate-intensity exercise (any activity that increases your heart rate and causes you to sweat) each week. In addition, most adults need muscle-strengthening exercises on 2 or more days a week.  Silver Sneakers may be a benefit available to you. To determine eligibility, you may visit the website: www.silversneakers.com or contact program at (618) 755-6555 Mon-Fri between 8AM-8PM.   . Maintain a healthy weight. The body mass index (BMI) is a screening tool to identify possible weight problems. It provides an estimate of body fat based on height and weight. Your health care provider can find your BMI and can help you achieve or maintain a healthy weight.   For adults 20 years and older: ? A BMI below 18.5 is considered underweight. ? A BMI of 18.5 to 24.9 is normal. ? A BMI of 25 to 29.9 is considered overweight. ? A BMI of 30 and above is considered obese.   . Maintain normal blood lipids and cholesterol levels  by exercising and minimizing your intake of saturated fat. Eat a balanced diet with plenty of fruit and vegetables. Blood tests for lipids and cholesterol should begin at age 58 and be repeated every 5 years. If your lipid or cholesterol levels are high, you are over 50, or you are at high risk for heart disease, you may need your cholesterol levels checked more frequently. Ongoing high lipid and cholesterol levels should be treated with medicines if diet and exercise are not working.  . If you smoke, find out from your health care provider how to quit. If you do  not use tobacco, please do not start.  . If you choose to drink alcohol, please do not consume more than 2 drinks per day. One drink is considered to be 12 ounces (355 mL) of beer, 5 ounces (148 mL) of wine, or 1.5 ounces (44 mL) of liquor.  . If you are 72-61 years old, ask your health care provider if you should take aspirin to prevent strokes.  . Use sunscreen. Apply sunscreen liberally and repeatedly throughout the day. You should seek shade when your shadow is shorter than you. Protect yourself by wearing long sleeves, pants, a wide-brimmed hat, and sunglasses year round, whenever you are outdoors.  . Once a month, do a whole body skin exam, using a mirror to look at the skin on your back. Tell your health care provider of new moles, moles that have irregular borders, moles that are larger than a pencil eraser, or moles that have changed in shape or color.

## 2017-02-24 NOTE — Patient Instructions (Addendum)
Make sure to take blood pressure medication daily.  Follow BP at home.. Call  In 1-2 weeks with BP measurements. Goal BP < 140/90.  Work on [employer] bearing exercise of some type.. Consider water exercise.  Call if interested in osteoporosis medication.  Preventing Osteoporosis, Adult Osteoporosis is a condition that causes the bones to get weaker. With osteoporosis, the bones become thinner, and the normal spaces in bone tissue become larger. This can make the bones weak and cause them to break more easily. People who have osteoporosis are more likely to break their wrist, spine, or hip. Even a minor accident or injury can be enough to break weak bones. Osteoporosis can occur with aging. Your body constantly replaces old bone tissue with new tissue. As you get older, you may lose bone tissue more quickly, or it may be replaced more slowly. Osteoporosis is more likely to develop if you have poor nutrition or do not get enough calcium or vitamin D. Other lifestyle factors can also play a role. By making some diet and lifestyle changes, you can help to keep your bones healthy and help to prevent osteoporosis. What nutrition changes can be made? Nutrition plays an important role in maintaining healthy, strong bones.  Make sure you get enough calcium every day from food or from calcium supplements. ? If you are age 42 or younger, aim to get 1,000 mg of calcium every day. ? If you are older than age 72, aim to get 1,200 mg of calcium every day.  Try to get enough vitamin D every day. ? If you are age 5 or younger, aim to get 600 international units (IU) every day. ? If you are older than age 72, aim to get 800 international units every day.  Follow a healthy diet. Eat plenty of foods that contain calcium and vitamin D. ? Calcium is in milk, cheese, yogurt, and other dairy products. Some fish and vegetables are also good sources of calcium. Many foods such as cereals and breads have had calcium added  to them (are fortified). Check nutrition labels to see how much calcium is in a food or drink. ? Foods that contain vitamin D include milk, cereals, salmon, and tuna. Your body also makes vitamin D when you are out in the sun. Bare skin exposure to the sun on your face, arms, legs, or back for no more than 30 minutes a day, 2 times per week is more than enough. Beyond that, it is important to use sunblock to protect your skin from sunburn, which increases your risk for skin cancer.  What lifestyle changes can be made? Making changes in your everyday life can also play an important role in preventing osteoporosis.  Stay active and get exercise every day. Ask your health care provider what types of exercise are best for you.  Do not use any products that contain nicotine or tobacco, such as cigarettes and e-cigarettes. If you need help quitting, ask your health care provider.  Limit alcohol intake to no more than 1 drink a day for nonpregnant women and 2 drinks a day for men. One drink equals 12 oz of beer, 5 oz of wine, or 1 oz of hard liquor.  Why are these changes important? Making these nutrition and lifestyle changes can:  Help you develop and maintain healthy, strong bones.  Prevent loss of bone mass and the problems that are caused by that loss, such as broken bones and delayed healing.  Make you feel better  mentally and physically.  What can happen if changes are not made? Problems that can result from osteoporosis can be very serious. These may include:  A higher risk of broken bones that are painful and do not heal well.  Physical malformations, such as a collapsed spine or a hunched back.  Problems with movement.  Where to find support: If you need help making changes to prevent osteoporosis, talk with your health care provider. You can ask for a referral to a diet and nutrition specialist (dietitian) and a physical therapist. Where to find more information: Learn more  about osteoporosis from:  NIH Osteoporosis and Related [place]: www.niams.GolfingGoddess.com.br  U.S. Office on Women's Health: SouvenirBaseball.es.html  National Osteoporosis Foundation: ProfilePeek.ch  Summary  Osteoporosis is a condition that causes weak bones that are more likely to break.  Eating a healthy diet and making sure you get enough calcium and vitamin D can help prevent osteoporosis.  Other ways to reduce your risk of osteoporosis include getting regular exercise and avoiding alcohol and products that contain nicotine or tobacco. This information is not intended to replace advice given to you by your health care provider. Make sure you discuss any questions you have with your health care provider. Document Released: 01/21/2015 Document Revised: 09/17/2015 Document Reviewed: 09/17/2015 Elsevier Interactive Patient Education  [employer].

## 2017-02-24 NOTE — Progress Notes (Signed)
Pre visit review using our clinic review tool, if applicable. No additional management support is needed unless otherwise documented below in the visit note. 

## 2017-02-24 NOTE — Progress Notes (Signed)
PCP notes:   Health maintenance:  Eye exam - addressed A1C - completed  Abnormal screenings:   Hearing - failed (completed at CPE)  Hearing Screening   125Hz  250Hz  500Hz  1000Hz  2000Hz  3000Hz  4000Hz  6000Hz  8000Hz   Right ear:   0 40 40  40    Left ear:   0 0 40  40     Patient concerns:   None  Nurse concerns:  None  Next PCP appt:   N/A; CPE prior to AWV

## 2017-02-24 NOTE — Progress Notes (Signed)
Subjective:    Patient ID: Melanie Lang, female    DOB: Dec 18, 1945, 72 y.o.   MRN: [id number]  HPI  72 year old female The patient presents for  complete physical and review of chronic health problems. He/She also has the following acute concerns today:   She will have AMW with Benita Stabile later today.  Recent kidney stone on 1/26. ER visit  Passed last week, pain resolved.  Hypertension:    Inadequate control in office today. She has not taken BP med in last 2 days, but took 2 hours ago. Amlodipine and lisinopril. BP Readings from Last 3 Encounters:  02/24/17 (!) 160/88  02/24/17 (!) 160/88  02/14/17 (!) 158/88  Using medication without problems or lightheadedness:  none Chest pain with exertion: none Edema: none Short of breath: none Average home BPs:  At home 120-140/79-82 Other issues:  Diabetes:   Due Lab Results  Component Value Date   HGBA1C 5.9 09/16/2016  Using medications without difficulties: Hypoglycemic episodes: Hyperglycemic episodes: Feet problems: no ulcers Blood Sugars averaging: fbs 105-115 eye exam within last year: yes 06/2016  Elevated Cholesterol:  Due Lab Results  Component Value Date   CHOL 226 (H) 09/16/2016   HDL 65.30 09/16/2016   LDLCALC 142 (H) 09/16/2016   LDLDIRECT 132.7 12/07/2012   TRIG 92.0 09/16/2016   CHOLHDL 3 09/16/2016  Using medications without problems: Muscle aches:  Diet compliance: moderate Exercise: walking.. Knees limit her. Other complaints:  Gout  Due for re-eval. No flares in last year Lab Results  Component Value Date   LABURIC 5.7 02/21/2016     Social History /Family History/Past Medical History reviewed in detail and updated in EMR if needed. Blood pressure (!) 160/88, pulse 76, temperature 98.3 F (36.8 C), temperature source Oral, height 5\' 4"  (1.626 m), weight 198 lb 4 oz (89.9 kg).   Review of Systems  Constitutional: Negative for fatigue and fever.  HENT: Negative for congestion.     Eyes: Negative for pain.  Respiratory: Negative for cough and shortness of breath.   Cardiovascular: Negative for chest pain, palpitations and leg swelling.  Gastrointestinal: Negative for abdominal pain.  Genitourinary: Negative for dysuria and vaginal bleeding.  Musculoskeletal: Positive for back pain.       Left chest wall strain.. Improved with muscle relaxant.  Neurological: Negative for syncope, light-headedness and headaches.  Psychiatric/Behavioral: Negative for dysphoric mood.       Objective:   Physical Exam  Constitutional: Vital signs are normal. She appears well-developed and well-nourished. She is cooperative.  Non-toxic appearance. She does not appear ill. No distress.  HENT:  Head: Normocephalic.  Right Ear: Hearing, tympanic membrane, external ear and ear canal normal.  Left Ear: Hearing, tympanic membrane, external ear and ear canal normal.  Nose: Nose normal.  Eyes: Conjunctivae, EOM and lids are normal. Pupils are equal, round, and reactive to light. Lids are everted and swept, no foreign bodies found.  Neck: Trachea normal and normal range of motion. Neck supple. Carotid bruit is not present. No thyroid mass and no thyromegaly present.  Cardiovascular: Normal rate, regular rhythm, S1 normal, S2 normal, normal heart sounds and intact distal pulses. Exam reveals no gallop.  No murmur heard. Pulmonary/Chest: Effort normal and breath sounds normal. No respiratory distress. She has no wheezes. She has no rhonchi. She has no rales.  Abdominal: Soft. Normal appearance and bowel sounds are normal. She exhibits no distension, no fluid wave, no abdominal bruit and no mass. There is  no hepatosplenomegaly. There is no tenderness. There is no rebound, no guarding and no CVA tenderness. No hernia.  Lymphadenopathy:    She has no cervical adenopathy.    She has no axillary adenopathy.  Neurological: She is alert. She has normal strength. No cranial nerve deficit or sensory  deficit.  Skin: Skin is warm, dry and intact. No rash noted.  Psychiatric: Her speech is normal and behavior is normal. Judgment normal. Her mood appears not anxious. Cognition and memory are normal. She does not exhibit a depressed mood.      Diabetic foot exam: Normal inspection No skin breakdown No calluses  Normal DP pulses Normal sensation to light touch and monofilament Nails normal     Assessment & Plan:  The patient's preventative maintenance and recommended screening tests for an annual wellness exam were reviewed in full today. Brought up to date unless services declined.  Counselled on the importance of diet, exercise, and its role in overall health and mortality. The patient's FH and SH was reviewed, including their home life, tobacco status, and drug and alcohol status.   Vaccines: due for flu and tdap but refused Colon: 3 polyps 12/2013 Dr. Watt Climes. Repeat in 5 years. Mammogram: Last nml in 11/2016. DXA: osteoporosis 11/2016, T -3.8 on vit D,  repeat in 2 years  Started on vit D3. Pap/DVE: not indicated total hysterectomy for non cancer indication. Non smoker Hep C: done

## 2017-03-19 ENCOUNTER — Ambulatory Visit: Payer: Medicare HMO

## 2017-04-07 ENCOUNTER — Encounter: Payer: Self-pay | Admitting: Podiatry

## 2017-04-07 ENCOUNTER — Ambulatory Visit: Payer: Medicare HMO | Admitting: Podiatry

## 2017-04-07 DIAGNOSIS — M79675 Pain in left toe(s): Secondary | ICD-10-CM

## 2017-04-07 DIAGNOSIS — M79674 Pain in right toe(s): Secondary | ICD-10-CM | POA: Diagnosis not present

## 2017-04-07 DIAGNOSIS — B351 Tinea unguium: Secondary | ICD-10-CM

## 2017-04-07 NOTE — Progress Notes (Signed)
She presents today with chief complaint of painful elongated toenails.  Objective: Vital signs are stable alert and oriented x3.  Pulses are palpable.  Toenails are long thick yellow dystrophic clinically mycotic.  Assessment: Pain in limb secondary to onychomycosis.  Plan: Discussed etiology pathology conservative versus surgical therapies.  Debrided toenails 1 through 5 bilateral.

## 2017-07-15 ENCOUNTER — Ambulatory Visit: Payer: Medicare HMO | Admitting: Podiatry

## 2017-07-15 ENCOUNTER — Encounter: Payer: Self-pay | Admitting: Podiatry

## 2017-07-15 DIAGNOSIS — M79675 Pain in left toe(s): Secondary | ICD-10-CM | POA: Diagnosis not present

## 2017-07-15 DIAGNOSIS — B351 Tinea unguium: Secondary | ICD-10-CM

## 2017-07-15 DIAGNOSIS — M79674 Pain in right toe(s): Secondary | ICD-10-CM | POA: Diagnosis not present

## 2017-07-16 NOTE — Progress Notes (Signed)
Subjective:   Patient ID: Melanie Lang, female   DOB: 72 y.o.   MRN: [id number]   HPI Patient presents with thick yellow brittle nailbeds 1-5 both feet that she cannot cut and they become painful   ROS      Objective:  Physical Exam  Mycotic nail infection with pain 1-5 both feet     Assessment:  Debride painful nailbeds 1-5 both feet with no iatrogenic bleeding     Plan:  Chronic mycotic nail infection with pain 1-5 both feet

## 2017-07-27 ENCOUNTER — Other Ambulatory Visit: Payer: Self-pay | Admitting: Family Medicine

## 2017-08-20 DIAGNOSIS — Z96652 Presence of left artificial knee joint: Secondary | ICD-10-CM | POA: Diagnosis not present

## 2017-08-20 DIAGNOSIS — M1712 Unilateral primary osteoarthritis, left knee: Secondary | ICD-10-CM | POA: Diagnosis not present

## 2017-08-20 DIAGNOSIS — Z471 Aftercare following joint replacement surgery: Secondary | ICD-10-CM | POA: Diagnosis not present

## 2017-10-02 ENCOUNTER — Ambulatory Visit (INDEPENDENT_AMBULATORY_CARE_PROVIDER_SITE_OTHER): Payer: Medicare HMO | Admitting: Family Medicine

## 2017-10-02 ENCOUNTER — Encounter: Payer: Self-pay | Admitting: Family Medicine

## 2017-10-02 VITALS — BP 162/98 | HR 96 | Temp 97.9°F | Ht 64.0 in | Wt 198.0 lb

## 2017-10-02 DIAGNOSIS — M79642 Pain in left hand: Secondary | ICD-10-CM | POA: Diagnosis not present

## 2017-10-02 DIAGNOSIS — E119 Type 2 diabetes mellitus without complications: Secondary | ICD-10-CM | POA: Diagnosis not present

## 2017-10-02 DIAGNOSIS — E7800 Pure hypercholesterolemia, unspecified: Secondary | ICD-10-CM

## 2017-10-02 DIAGNOSIS — I10 Essential (primary) hypertension: Secondary | ICD-10-CM

## 2017-10-02 DIAGNOSIS — M79641 Pain in right hand: Secondary | ICD-10-CM | POA: Insufficient documentation

## 2017-10-02 DIAGNOSIS — R200 Anesthesia of skin: Secondary | ICD-10-CM | POA: Diagnosis not present

## 2017-10-02 LAB — COMPREHENSIVE METABOLIC PANEL
ALBUMIN: 4.3 g/dL (ref 3.5–5.2)
ALT: 13 U/L (ref 0–35)
AST: 16 U/L (ref 0–37)
Alkaline Phosphatase: 62 U/L (ref 39–117)
BUN: 14 mg/dL (ref 6–23)
CALCIUM: 10.1 mg/dL (ref 8.4–10.5)
CO2: 29 meq/L (ref 19–32)
CREATININE: 0.73 mg/dL (ref 0.40–1.20)
Chloride: 100 mEq/L (ref 96–112)
GFR: 100.74 mL/min (ref >60.00)
Glucose, Bld: 103 mg/dL — ABNORMAL HIGH (ref 70–99)
POTASSIUM: 3.7 meq/L (ref 3.5–5.1)
Sodium: 139 mEq/L (ref 135–145)
Total Bilirubin: 0.6 mg/dL (ref 0.2–1.2)
Total Protein: 7.8 g/dL (ref 6.0–8.3)

## 2017-10-02 LAB — TSH: TSH: 2.01 u[IU]/mL (ref 0.35–4.50)

## 2017-10-02 LAB — HEMOGLOBIN A1C: Hgb A1c MFr Bld: 6.1 % (ref 4.6–6.5)

## 2017-10-02 LAB — LIPID PANEL
CHOL/HDL RATIO: 4
Cholesterol: 225 mg/dL — ABNORMAL HIGH (ref 0–200)
HDL: 63.2 mg/dL (ref >39.00)
LDL CALC: 142 mg/dL — AB (ref 0–99)
NonHDL: 162.08
TRIGLYCERIDES: 100 mg/dL (ref 0.0–149.0)
VLDL: 20 mg/dL (ref 0.0–40.0)

## 2017-10-02 LAB — C-REACTIVE PROTEIN: CRP: 0.8 mg/dL (ref 0.5–20.0)

## 2017-10-02 LAB — SEDIMENTATION RATE: SED RATE: 34 mm/h — AB (ref 0–30)

## 2017-10-02 LAB — VITAMIN B12: VITAMIN B 12: 462 pg/mL (ref 211–911)

## 2017-10-02 LAB — HM DIABETES FOOT EXAM

## 2017-10-02 MED ORDER — CLOTRIMAZOLE-BETAMETHASONE 1-0.05 % EX CREA: 1.0000 "application " | TOPICAL_CREAM | Freq: Two times a day (BID) | CUTANEOUS | 0 refills | Status: DC

## 2017-10-02 NOTE — Assessment & Plan Note (Addendum)
Eval with labs. No clear back issue or compression etiology. nml pulse bialterally and no claudicaiton symptoms.

## 2017-10-02 NOTE — Patient Instructions (Addendum)
Set up yearly eye exam.  Follow blood pressure at home.. Goal < 140/90

## 2017-10-02 NOTE — Progress Notes (Signed)
Subjective:    Patient ID: Melanie Lang, female    DOB: 1945-06-29, 72 y.o.   MRN: [id number]  HPI   72 year old female pt presents for follow up DM, cholesterol.  She has several other health concerns today:  She has been having soreness in bilateral hands in middle 2 fingers at MCP joint.Marland Kitchen occ swollen, no red, not warm She was told when she was 18 that she had RA. Went away and never had issue with it after that.Father and grandfather Dx with it as well.   She is back to walking 15000 step a day.   Diabetes:  Due for re-eval. Using medications without difficulties: Hypoglycemic episodes: Hyperglycemic episodes: Feet problems: no ulcers Blood Sugars averaging: FBS 110 eye exam within last year: due   Occ numbness on bottom right foot when  First start walking, then it gets better. Off and on in last 4-5 months.  Insoles has helped some with the numbness. No low back pain,  right knee replacement 7 years ago.. Dr. Elmyra Ricks.  Elevated Cholesterol: . Due for re-eval.  Using medications without problems: Muscle aches:  Diet compliance: yes Exercise: Other complaints:   Hypertension:  Inadequate control today at home controlled on  Amlodipine and lisinopril.. She feels it is high given stressful event before she came to office Using medication without problems or lightheadedness: none Chest pain with exertion:none Edema:none Short of breath:none Average home BPs: 120/79 Other issues: BP Readings from Last 3 Encounters:  10/02/17 (!) 158/86  02/24/17 (!) 160/88  02/24/17 (!) 160/88     Blood pressure (!) 158/86, pulse 96, temperature 97.9 F (36.6 C), temperature source Oral, height 5\' 4"  (1.626 m), weight 198 lb (89.8 kg), SpO2 97 %.    Review of Systems  Constitutional: Negative for fatigue and fever.  HENT: Negative for congestion.   Eyes: Negative for pain.  Respiratory: Negative for cough and shortness of breath.   Cardiovascular: Negative for chest  pain, palpitations and leg swelling.  Gastrointestinal: Negative for abdominal pain.  Genitourinary: Negative for dysuria and vaginal bleeding.  Musculoskeletal: Negative for back pain.  Neurological: Negative for syncope, light-headedness and headaches.  Psychiatric/Behavioral: Negative for dysphoric mood.       Objective:   Physical Exam  Constitutional: She is oriented to person, place, and time. Vital signs are normal. She appears well-developed and well-nourished. She is cooperative.  Non-toxic appearance. She does not appear ill. No distress.  HENT:  Head: Normocephalic.  Right Ear: Hearing, tympanic membrane, external ear and ear canal normal. Tympanic membrane is not erythematous, not retracted and not bulging.  Left Ear: Hearing, tympanic membrane, external ear and ear canal normal. Tympanic membrane is not erythematous, not retracted and not bulging.  Nose: No mucosal edema or rhinorrhea. Right sinus exhibits no maxillary sinus tenderness and no frontal sinus tenderness. Left sinus exhibits no maxillary sinus tenderness and no frontal sinus tenderness.  Mouth/Throat: Uvula is midline, oropharynx is clear and moist and mucous membranes are normal.  Eyes: Pupils are equal, round, and reactive to light. Conjunctivae, EOM and lids are normal. Lids are everted and swept, no foreign bodies found.  Neck: Trachea normal and normal range of motion. Neck supple. Carotid bruit is not present. No thyroid mass and no thyromegaly present.  Cardiovascular: Normal rate, regular rhythm, S1 normal, S2 normal, normal heart sounds, intact distal pulses and normal pulses. Exam reveals no gallop and no friction rub.  No murmur heard. Pulmonary/Chest: Effort normal and  breath sounds normal. No tachypnea. No respiratory distress. She has no decreased breath sounds. She has no wheezes. She has no rhonchi. She has no rales.  Abdominal: Soft. Normal appearance and bowel sounds are normal. There is no  tenderness.  Musculoskeletal:  Soreness to palpation in Bilateral palmar aspect of  3rd and 4th MCP jpints, slightly swollen, no clear deformity.  Neurological: She is alert and oriented to person, place, and time. She has normal strength and normal reflexes. No cranial nerve deficit or sensory deficit. She exhibits normal muscle tone. She displays a negative Romberg sign. Coordination and gait normal. GCS eye subscore is 4. GCS verbal subscore is 5. GCS motor subscore is 6.  Nml cerebellar exam   No papilledema  Skin: Skin is warm, dry and intact. No rash noted.  Psychiatric: She has a normal mood and affect. Her speech is normal and behavior is normal. Judgment and thought content normal. Her mood appears not anxious. Cognition and memory are normal. Cognition and memory are not impaired. She does not exhibit a depressed mood. She exhibits normal recent memory and normal remote memory.    Diabetic foot exam: Normal inspection No skin breakdown No calluses  Normal DP pulses Normal sensation to light touch and monofilament Nails normal      Assessment & Plan:

## 2017-10-02 NOTE — Assessment & Plan Note (Signed)
Due for re-eval. 

## 2017-10-02 NOTE — Assessment & Plan Note (Signed)
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 eval with Rheum work up.

## 2017-10-02 NOTE — Assessment & Plan Note (Signed)
Due for re-eval but likely well controlled. May be related to numbness.

## 2017-10-02 NOTE — Assessment & Plan Note (Signed)
Well controlled. Continue current medication.  

## 2017-10-05 LAB — RHEUMATOID FACTOR: Rhuematoid fact SerPl-aCnc: <14 IU/mL (ref <14)

## 2017-10-05 LAB — CYCLIC CITRUL PEPTIDE ANTIBODY, IGG

## 2017-10-08 ENCOUNTER — Other Ambulatory Visit: Payer: Self-pay | Admitting: Family Medicine

## 2017-10-08 MED ORDER — ATORVASTATIN CALCIUM 20 MG PO TABS: 20.0000 mg | ORAL_TABLET | Freq: Every day | ORAL | 3 refills | Status: DC

## 2017-10-08 NOTE — Progress Notes (Signed)
Start daily atorvastatin.. Return in 3 months for lab only OV for chol and LFT recheck on medication.  Sent 90 day to  Mail order.  In case of gout flare where having to use colchicine.. Hold cholesterol med.. Then restart once off colchicine.

## 2017-10-12 ENCOUNTER — Other Ambulatory Visit: Payer: Medicare HMO

## 2017-10-19 ENCOUNTER — Ambulatory Visit: Payer: Medicare HMO | Admitting: Podiatry

## 2017-10-19 ENCOUNTER — Encounter: Payer: Self-pay | Admitting: Podiatry

## 2017-10-19 DIAGNOSIS — M79674 Pain in right toe(s): Secondary | ICD-10-CM

## 2017-10-19 DIAGNOSIS — B351 Tinea unguium: Secondary | ICD-10-CM

## 2017-10-19 DIAGNOSIS — M79675 Pain in left toe(s): Secondary | ICD-10-CM

## 2017-10-19 NOTE — Progress Notes (Signed)
Subjective:   Patient ID: Melanie Lang, female   DOB: 72 y.o.   MRN: [id number]   HPI Patient presents with elongated nailbeds 1-5 both feet that are thick yellow brittle and she cannot take care of herself   ROS      Objective:  Physical Exam  Thick yellow brittle nailbeds 1-5 both feet that are painful and incurvated in the corners 1-5 bilateral     Assessment:  Chronic mycotic nail infection with pain 1-5 both feet     Plan:  Debrided nailbeds 1-5 both feet with no iatrogenic bleeding noted

## 2018-01-18 DIAGNOSIS — E119 Type 2 diabetes mellitus without complications: Secondary | ICD-10-CM | POA: Diagnosis not present

## 2018-01-18 DIAGNOSIS — H524 Presbyopia: Secondary | ICD-10-CM | POA: Diagnosis not present

## 2018-01-18 LAB — HM DIABETES EYE EXAM

## 2018-01-19 ENCOUNTER — Ambulatory Visit: Payer: Medicare HMO | Admitting: Podiatry

## 2018-01-25 ENCOUNTER — Other Ambulatory Visit: Payer: Self-pay | Admitting: Family Medicine

## 2018-01-25 DIAGNOSIS — Z1231 Encounter for screening mammogram for malignant neoplasm of breast: Secondary | ICD-10-CM

## 2018-01-27 ENCOUNTER — Ambulatory Visit
Admission: RE | Admit: 2018-01-27 | Discharge: 2018-01-27 | Disposition: A | Discharge status: Home / Self Care | Payer: Medicare HMO | Source: Ambulatory Visit | Attending: Family Medicine | Admitting: Family Medicine

## 2018-01-27 DIAGNOSIS — Z1231 Encounter for screening mammogram for malignant neoplasm of breast: Secondary | ICD-10-CM | POA: Diagnosis not present

## 2018-01-29 ENCOUNTER — Ambulatory Visit: Payer: Medicare HMO | Admitting: Podiatry

## 2018-01-29 DIAGNOSIS — M79675 Pain in left toe(s): Secondary | ICD-10-CM

## 2018-01-29 DIAGNOSIS — B351 Tinea unguium: Secondary | ICD-10-CM

## 2018-01-29 DIAGNOSIS — E119 Type 2 diabetes mellitus without complications: Secondary | ICD-10-CM | POA: Diagnosis not present

## 2018-01-29 DIAGNOSIS — L84 Corns and callosities: Secondary | ICD-10-CM | POA: Diagnosis not present

## 2018-01-29 DIAGNOSIS — M79674 Pain in right toe(s): Secondary | ICD-10-CM | POA: Diagnosis not present

## 2018-01-29 DIAGNOSIS — S90229A Contusion of unspecified lesser toe(s) with damage to nail, initial encounter: Secondary | ICD-10-CM

## 2018-01-29 NOTE — Patient Instructions (Signed)
Diabetes Mellitus and Foot Care Foot care is an important part of your health, especially when you have diabetes. Diabetes may cause you to have problems because of poor blood flow (circulation) to your feet and legs, which can cause your skin to:  Become thinner and drier.  Break more easily.  Heal more slowly.  Peel and crack. You may also have nerve damage (neuropathy) in your legs and feet, causing decreased feeling in them. This means that you may not notice minor injuries to your feet that could lead to more serious problems. Noticing and addressing any potential problems early is the best way to prevent future foot problems. How to care for your feet Foot hygiene  Wash your feet daily with warm water and mild soap. Do not use hot water. Then, pat your feet and the areas between your toes until they are completely dry. Do not soak your feet as this can dry your skin.  Trim your toenails straight across. Do not dig under them or around the cuticle. File the edges of your nails with an emery board or nail file.  Apply a moisturizing lotion or petroleum jelly to the skin on your feet and to dry, brittle toenails. Use lotion that does not contain alcohol and is unscented. Do not apply lotion between your toes. Shoes and socks  Wear clean socks or stockings every day. Make sure they are not too tight. Do not wear knee-high stockings since they may decrease blood flow to your legs.  Wear shoes that fit properly and have enough cushioning. Always look in your shoes before you put them on to be sure there are no objects inside.  To break in new shoes, wear them for just a few hours a day. This prevents injuries on your feet. Wounds, scrapes, corns, and calluses  Check your feet daily for blisters, cuts, bruises, sores, and redness. If you cannot see the bottom of your feet, use a mirror or ask someone for help.  Do not cut corns or calluses or try to remove them with medicine.  If you  find a minor scrape, cut, or break in the skin on your feet, keep it and the skin around it clean and dry. You may clean these areas with mild soap and water. Do not clean the area with peroxide, alcohol, or iodine.  If you have a wound, scrape, corn, or callus on your foot, look at it several times a day to make sure it is healing and not infected. Check for: ? Redness, swelling, or pain. ? Fluid or blood. ? Warmth. ? Pus or a bad smell. General instructions  Do not cross your legs. This may decrease blood flow to your feet.  Do not use heating pads or hot water bottles on your feet. They may burn your skin. If you have lost feeling in your feet or legs, you may not know this is happening until it is too late.  Protect your feet from hot and cold by wearing shoes, such as at the beach or on hot pavement.  Schedule a complete foot exam at least once a year (annually) or more often if you have foot problems. If you have foot problems, report any cuts, sores, or bruises to your health care provider immediately. Contact a health care provider if:  You have a medical condition that increases your risk of infection and you have any cuts, sores, or bruises on your feet.  You have an injury that is not   healing.  You have redness on your legs or feet.  You feel burning or tingling in your legs or feet.  You have pain or cramps in your legs and feet.  Your legs or feet are numb.  Your feet always feel cold.  You have pain around a toenail. Get help right away if:  You have a wound, scrape, corn, or callus on your foot and: ? You have pain, swelling, or redness that gets worse. ? You have fluid or blood coming from the wound, scrape, corn, or callus. ? Your wound, scrape, corn, or callus feels warm to the touch. ? You have pus or a bad smell coming from the wound, scrape, corn, or callus. ? You have a fever. ? You have a red line going up your leg. Summary  Check your feet every day  for cuts, sores, red spots, swelling, and blisters.  Moisturize feet and legs daily.  Wear shoes that fit properly and have enough cushioning.  If you have foot problems, report any cuts, sores, or bruises to your health care provider immediately.  Schedule a complete foot exam at least once a year (annually) or more often if you have foot problems. This information is not intended to replace advice given to you by your health care provider. Make sure you discuss any questions you have with your health care provider. Document Released: 01/04/2000 Document Revised: 02/18/2017 Document Reviewed: 02/08/2016 Elsevier Interactive Patient Education  2019 [employer] Onychomycosis/Fungal Toenails  WHAT IS IT? An infection that lies within the keratin of your nail plate that is caused by a fungus.  WHY ME? Fungal infections affect all ages, sexes, races, and creeds.  There may be many factors that predispose you to a fungal infection such as age, coexisting medical conditions such as diabetes, or an autoimmune disease; stress, medications, fatigue, genetics, etc.  Bottom line: fungus thrives in a warm, moist environment and your shoes offer such a location.  IS IT CONTAGIOUS? Theoretically, yes.  You do not want to share shoes, nail clippers or files with someone who has fungal toenails.  Walking around barefoot in the same room or sleeping in the same bed is unlikely to transfer the organism.  It is important to realize, however, that fungus can spread easily from one nail to the next on the same foot.  HOW DO WE TREAT THIS?  There are several ways to treat this condition.  Treatment may depend on many factors such as age, medications, pregnancy, liver and kidney conditions, etc.  It is best to ask your doctor which options are available to you.  1. No treatment.   Unlike many other medical concerns, you can live with this condition.  However for many people this can be a painful condition and may  lead to ingrown toenails or a bacterial infection.  It is recommended that you keep the nails cut short to help reduce the amount of fungal nail. 2. Topical treatment.  These range from herbal remedies to prescription strength nail lacquers.  About 40-50% effective, topicals require twice daily application for approximately 9 to 12 months or until an entirely new nail has grown out.  The most effective topicals are medical grade medications available through physicians offices. 3. Oral antifungal medications.  With an 80-90% cure rate, the most common oral medication requires 3 to 4 months of therapy and stays in your system for a year as the new nail grows out.  Oral antifungal medications do require blood   work to make sure it is a safe drug for you.  A liver function panel will be performed prior to starting the medication and after the first month of treatment.  It is important to have the blood work performed to avoid any harmful side effects.  In general, this medication safe but blood work is required. 4. Laser Therapy.  This treatment is performed by applying a specialized laser to the affected nail plate.  This therapy is noninvasive, fast, and non-painful.  It is not covered by insurance and is therefore, out of pocket.  The results have been very good with a 80-95% cure rate.  [facility] is the only practice in the area to offer this therapy. 5. Permanent Nail Avulsion.  Removing the entire nail so that a new nail will not grow back.  Corns and Calluses Corns are small areas of thickened skin that occur on the top, sides, or tip of a toe. They contain a cone-shaped core with a point that can press on a nerve below. This causes pain.  Calluses are areas of thickened skin that can occur anywhere on the body, including the hands, fingers, palms, soles of the feet, and heels. Calluses are usually larger than corns. What are the causes? Corns and calluses are caused by rubbing (friction) or  pressure, such as from shoes that are too tight or do not fit properly. What increases the risk? Corns are more likely to develop in people who have misshapen toes (toe deformities), such as hammer toes. Calluses can occur with friction to any area of the skin. They are more likely to develop in people who:  Work with their hands.  Wear shoes that fit poorly, are too tight, or are high-heeled.  Have toe deformities. What are the signs or symptoms? Symptoms of a corn or callus include:  A hard growth on the skin.  Pain or tenderness under the skin.  Redness and swelling.  Increased discomfort while wearing tight-fitting shoes, if your feet are affected. If a corn or callus becomes infected, symptoms may include:  Redness and swelling that gets worse.  Pain.  Fluid, blood, or pus draining from the corn or callus. How is this diagnosed? Corns and calluses may be diagnosed based on your symptoms, your medical history, and a physical exam. How is this treated? Treatment for corns and calluses may include:  Removing the cause of the friction or pressure. This may involve: ? Changing your shoes. ? Wearing shoe inserts (orthotics) or other protective layers in your shoes, such as a corn pad. ? Wearing gloves.  Applying medicine to the skin (topical medicine) to help soften skin in the hardened, thickened areas.  Removing layers of dead skin with a file to reduce the size of the corn or callus.  Removing the corn or callus with a scalpel or laser.  Taking antibiotic medicines, if your corn or callus is infected.  Having surgery, if a toe deformity is the cause. Follow these instructions at home:   Take over-the-counter and prescription medicines only as told by your health care provider.  If you were prescribed an antibiotic, take it as told by your health care provider. Do not stop taking it even if your condition starts to improve.  Wear shoes that fit well. Avoid  wearing high-heeled shoes and shoes that are too tight or too loose.  Wear any padding, protective layers, gloves, or orthotics as told by your health care provider.  Soak your   hands or feet and then use a file or pumice stone to soften your corn or callus. Do this as told by your health care provider.  Check your corn or callus every day for symptoms of infection. Contact a health care provider if you:  Notice that your symptoms do not improve with treatment.  Have redness or swelling that gets worse.  Notice that your corn or callus becomes painful.  Have fluid, blood, or pus coming from your corn or callus.  Have new symptoms. Summary  Corns are small areas of thickened skin that occur on the top, sides, or tip of a toe.  Calluses are areas of thickened skin that can occur anywhere on the body, including the hands, fingers, palms, and soles of the feet. Calluses are usually larger than corns.  Corns and calluses are caused by rubbing (friction) or pressure, such as from shoes that are too tight or do not fit properly.  Treatment may include wearing any padding, protective layers, gloves, or orthotics as told by your health care provider. This information is not intended to replace advice given to you by your health care provider. Make sure you discuss any questions you have with your health care provider. Document Released: 10/13/2003 Document Revised: 11/19/2016 Document Reviewed: 11/19/2016 Elsevier Interactive Patient Education  2019 [employer]  

## 2018-02-01 DIAGNOSIS — M65341 Trigger finger, right ring finger: Secondary | ICD-10-CM | POA: Diagnosis not present

## 2018-02-01 DIAGNOSIS — M65332 Trigger finger, left middle finger: Secondary | ICD-10-CM | POA: Diagnosis not present

## 2018-02-01 DIAGNOSIS — M79641 Pain in right hand: Secondary | ICD-10-CM | POA: Diagnosis not present

## 2018-02-01 DIAGNOSIS — M79642 Pain in left hand: Secondary | ICD-10-CM | POA: Diagnosis not present

## 2018-02-16 ENCOUNTER — Ambulatory Visit: Payer: Medicare HMO | Admitting: Podiatry

## 2018-02-16 DIAGNOSIS — L03032 Cellulitis of left toe: Secondary | ICD-10-CM | POA: Diagnosis not present

## 2018-02-16 HISTORY — PX: TOENAIL EXCISION: SUR558

## 2018-02-16 NOTE — Patient Instructions (Signed)

## 2018-02-19 ENCOUNTER — Encounter: Payer: Self-pay | Admitting: Podiatry

## 2018-02-19 NOTE — Progress Notes (Signed)
Subjective:  Melanie Lang presents to clinic today with cc of painful toenail of left great toe.  Patient was seen earlier this month and noted to have a subungual seroma of the left great toe.  She states area has not stopped bleeding under the nail.  Objective:  Vascular: Capillary refill time is immediate to all 10 digits. Palpable pedal pulses bilaterally Digital hair present x10 digits. Skin temperature gradient within normal limits bilaterally.  Dermatological Left great toe noted to show signs of recent debridement.  There is subungual granular elation tissue noted at the center aspect of the nail with a prior subungual seroma was.  There is no erythema, no edema, no drainage noted.    Musculoskeletal Muscle strength b/l to all muscle groups  Neurological Sensation intact with 10 gram monofilament  Assessment: Pain left great toe  Plan: 1. Discussed diagnosis of with treatment options.  I discussed the fact that subungual area is still irritated and nail plate would need to be removed temporarily so the area under the nail could heal. 2. Patient agreed to have temporary total nail avulsion of left great toe performed. 3. Consent form signed and in chart. 4. Prepped great toe with chlorhexidine. 5. Local injection of  3 cc's 50/50 mix of 1% Lidocaine plain and 0.5% marcaine plain administered via hallux block. Offending nail plate freed from its border attachments utilizing elevator. Offending nail plate removed with hemostat. Borders cleansed with alcohol. Light bleeding controlled with Lumicaine Hemostatic Solution.  Antibiotic ointment and light dressng applied. 6. Patient may take Tylenol or Advil for pain. 7. Rest feet tonight. 8. Start foot soaks tomorrow. Discused post-procedure instructions of epsom salt warm water soaks and dispensed written handout given. 9. Patient/POA related understanding of post-procedure instructions 10. Follow up 2 weeks for procedure  check. Call office if there is increased redness, swelling, drainage, odor or pain.

## 2018-02-19 NOTE — Progress Notes (Signed)
Subjective: Melanie Lang presents today with painful, thick toenails 1-5 b/l that she cannot cut and which interfere with daily activities.  Pain is aggravated when wearing enclosed shoe gear.  Jinny Sanders, MD is her PCP.   Current Outpatient Medications:  .  Alcohol Swabs (B-D SINGLE USE SWABS REGULAR) PADS, Use to check blood sugar daily.  Dx: E11.9, Disp: 100 each, Rfl: 3 .  amLODipine (NORVASC) 10 MG tablet, TAKE 1 TABLET EVERY DAY, Disp: 90 tablet, Rfl: 1 .  amoxicillin (AMOXIL) 500 MG capsule, Take 2,000 mg by mouth daily as needed (before dental apptment)., Disp: , Rfl:  .  aspirin EC 81 MG tablet, Take 81 mg by mouth daily., Disp: , Rfl:  .  atorvastatin (LIPITOR) 20 MG tablet, Take 1 tablet (20 mg total) by mouth daily., Disp: 90 tablet, Rfl: 3 .  Blood Glucose Calibration (TRUE METRIX LEVEL 1) Low SOLN, USE AS DIRECTED TO CHECK CONTROLS ON GLUCOSE METER. , Disp: 1 each, Rfl: 3 .  Blood Glucose Monitoring Suppl (TRUE METRIX AIR GLUCOSE METER) DEVI, 1 each by Does not apply route daily. E11.9, Disp: 1 Device, Rfl: 0 .  cholecalciferol (VITAMIN D) 1000 units tablet, Take 1,000 Units by mouth daily., Disp: , Rfl:  .  clotrimazole-betamethasone (LOTRISONE) cream, Apply 1 application topically 2 (two) times daily., Disp: 15 g, Rfl: 0 .  COLCRYS 0.6 MG tablet, TAKE 1 TABLET BY MOUTH 2 TIMES DAILY. (Patient taking differently: TAKE 1 TABLET  (0.6mg ) BY MOUTH 2 TIMES DAILY prn gout flare), Disp: 20 tablet, Rfl: 0 .  furosemide (LASIX) 20 MG tablet, TAKE 1 TABLET ONE TIME DAILY AS NEEDED FOR  FLUID (Patient taking differently: TAKE 1 TABLET (20 mg ) ONE TIME DAILY AS NEEDED FOR  FLUID), Disp: 90 tablet, Rfl: 1 .  ibuprofen (ADVIL,MOTRIN) 800 MG tablet, Take 1 tablet (800 mg total) by mouth every 8 (eight) hours as needed., Disp: 12 tablet, Rfl: 0 .  lisinopril (PRINIVIL,ZESTRIL) 40 MG tablet, TAKE 1 TABLET EVERY DAY, Disp: 90 tablet, Rfl: 1 .  meloxicam (MOBIC) 15 MG tablet, Take 1  tablet (15 mg total) by mouth daily. (Patient taking differently: Take 15 mg by mouth daily as needed for pain. ), Disp: 30 tablet, Rfl: 2 .  Naphazoline-Pheniramine (OPCON-A OP), Place 1 drop into both eyes daily as needed (For allergies.)., Disp: , Rfl:  .  omeprazole (PRILOSEC) 20 MG capsule, Take 20 mg by mouth daily as needed (acid reflux)., Disp: , Rfl:  .  TRUE METRIX BLOOD GLUCOSE TEST test strip, CHECK BLOOD SUGAR ONE TIME DAILY, Disp: 100 each, Rfl: 3 .  TRUEPLUS LANCETS 33G MISC, CHECK BLOOD SUGAR EVERY DAY, Disp: 100 each, Rfl: 3 .  VITAMIN E PO, Take 1 capsule by mouth daily., Disp: , Rfl:   Allergies  Allergen Reactions  . Shellfish Allergy Itching, Swelling and Other (See Comments)    Facial swelling  . Eggs Or Egg-Derived Products Itching  . Peanuts [Peanut Oil] Itching and Other (See Comments)    Throat itching    Objective:  Vascular Examination: Capillary refill time immediate x 10 digits  Dorsalis pedis and Posterior tibial pulses palpable b/l  Digital hair present x 10 digits  Skin temperature gradient WNL b/l  Dermatological Examination: Skin with normal turgor, texture and tone b/l  Toenails 1-5 b/l discolored, thick, dystrophic with subungual debris and pain with palpation to nailbeds due to thickness of nails.  Left great toe noted to exhibit clinical signs of  subungual seroma.  During debridement, clear drainage was noted to be present.  There was no purulence present.  There is no surrounding erythema, edema, or drainage noted.  Hyperkeratotic lesion noted dorsal fifth digit PIPJ right foot.  No surrounding erythema, no edema, no drainage, no flocculence noted.  Musculoskeletal: Muscle strength 5/5 to all LE muscle groups  No gross bony deformities b/l.  No pain, crepitus or joint limitation noted with ROM.   Neurological: Sensation intact with 10 gram monofilament. Vibratory sensation intact.  Assessment: Painful onychomycosis toenails 1-5  b/l  Subungual seroma left great toe Corn right fifth digit NIDDM  Plan: 1. Toenails 1-5 b/l were debrided in length and girth. 2. Left great toe was debrided and subungual seroma was treated with triple antibiotic ointment.  She was instructed to apply triple antibiotic ointment to the toe once a day for 7 days.  She is to call the office should area not improve. 3. Corn pared without complication right fifth digit 4. Patient to continue soft, supportive shoe gear 5. Patient to report any pedal injuries to medical professional immediately. 6. Follow up 3 months. Patient/POA to call should there be a concern in the interim.

## 2018-03-01 ENCOUNTER — Telehealth: Payer: Self-pay

## 2018-03-01 DIAGNOSIS — M810 Age-related osteoporosis without current pathological fracture: Secondary | ICD-10-CM

## 2018-03-01 DIAGNOSIS — M1A00X0 Idiopathic chronic gout, unspecified site, without tophus (tophi): Secondary | ICD-10-CM

## 2018-03-01 DIAGNOSIS — E559 Vitamin D deficiency, unspecified: Secondary | ICD-10-CM

## 2018-03-01 DIAGNOSIS — E785 Hyperlipidemia, unspecified: Secondary | ICD-10-CM

## 2018-03-01 DIAGNOSIS — I10 Essential (primary) hypertension: Secondary | ICD-10-CM

## 2018-03-01 DIAGNOSIS — E119 Type 2 diabetes mellitus without complications: Secondary | ICD-10-CM

## 2018-03-01 NOTE — Telephone Encounter (Signed)
CPE labs ordered 

## 2018-03-02 ENCOUNTER — Ambulatory Visit (INDEPENDENT_AMBULATORY_CARE_PROVIDER_SITE_OTHER): Payer: Medicare HMO

## 2018-03-02 ENCOUNTER — Ambulatory Visit: Payer: Medicare HMO

## 2018-03-02 VITALS — BP 152/100 | HR 82 | Temp 98.4°F | Ht 65.0 in | Wt 204.2 lb

## 2018-03-02 DIAGNOSIS — Z0000 Encounter for general adult medical examination without abnormal findings: Secondary | ICD-10-CM

## 2018-03-02 DIAGNOSIS — I10 Essential (primary) hypertension: Secondary | ICD-10-CM

## 2018-03-02 DIAGNOSIS — E559 Vitamin D deficiency, unspecified: Secondary | ICD-10-CM

## 2018-03-02 DIAGNOSIS — E119 Type 2 diabetes mellitus without complications: Secondary | ICD-10-CM | POA: Diagnosis not present

## 2018-03-02 DIAGNOSIS — L03032 Cellulitis of left toe: Secondary | ICD-10-CM

## 2018-03-02 DIAGNOSIS — E785 Hyperlipidemia, unspecified: Secondary | ICD-10-CM

## 2018-03-02 DIAGNOSIS — M1A00X0 Idiopathic chronic gout, unspecified site, without tophus (tophi): Secondary | ICD-10-CM | POA: Diagnosis not present

## 2018-03-02 DIAGNOSIS — M810 Age-related osteoporosis without current pathological fracture: Secondary | ICD-10-CM | POA: Diagnosis not present

## 2018-03-02 LAB — COMPREHENSIVE METABOLIC PANEL
ALBUMIN: 3.9 g/dL (ref 3.5–5.2)
ALT: 13 U/L (ref 0–35)
AST: 16 U/L (ref 0–37)
Alkaline Phosphatase: 55 U/L (ref 39–117)
BUN: 14 mg/dL (ref 6–23)
CHLORIDE: 102 meq/L (ref 96–112)
CO2: 28 meq/L (ref 19–32)
CREATININE: 0.69 mg/dL (ref 0.40–1.20)
Calcium: 9.7 mg/dL (ref 8.4–10.5)
GFR: 101.04 mL/min (ref >60.00)
Glucose, Bld: 105 mg/dL — ABNORMAL HIGH (ref 70–99)
Potassium: 3.9 mEq/L (ref 3.5–5.1)
SODIUM: 139 meq/L (ref 135–145)
Total Bilirubin: 0.7 mg/dL (ref 0.2–1.2)
Total Protein: 6.9 g/dL (ref 6.0–8.3)

## 2018-03-02 LAB — VITAMIN D 25 HYDROXY (VIT D DEFICIENCY, FRACTURES): VITD: 32.79 ng/mL (ref 30.00–100.00)

## 2018-03-02 LAB — LIPID PANEL
Cholesterol: 223 mg/dL — ABNORMAL HIGH (ref 0–200)
HDL: 64.5 mg/dL
LDL Cholesterol: 138 mg/dL — ABNORMAL HIGH (ref 0–99)
NonHDL: 158.44
Total CHOL/HDL Ratio: 3
Triglycerides: 102 mg/dL (ref 0.0–149.0)
VLDL: 20.4 mg/dL (ref 0.0–40.0)

## 2018-03-02 LAB — HEMOGLOBIN A1C: Hgb A1c MFr Bld: 6.1 % (ref 4.6–6.5)

## 2018-03-02 LAB — URIC ACID: URIC ACID, SERUM: 6.2 mg/dL (ref 2.4–7.0)

## 2018-03-02 NOTE — Patient Instructions (Signed)
Melanie Lang , Thank you for taking time to come for your Medicare Wellness Visit. I appreciate your ongoing commitment to your health goals. Please review the following plan we discussed and let me know if I can assist you in the future.   These are the goals we discussed: Goals    . Follow up with Primary Care Provider     Starting 03/02/2018, I will continue to take medications as prescribed and to keep appointments with PCP as scheduled.        This is a list of the screening recommended for you and due dates:  Health Maintenance  Topic Date Due  . Tetanus Vaccine  01/20/2020*  . Hemoglobin A1C  08/31/2018  . Complete foot exam   10/03/2018  . Colon Cancer Screening  01/05/2019  . Eye exam for diabetics  01/30/2019  . Mammogram  01/28/2020  . DEXA scan (bone density measurement)  Completed  .  Hepatitis C: One time screening is recommended by Center for Disease Control  (CDC) for  adults born from [birth year] through 1965.   Completed  . Pneumonia vaccines  Completed  *Topic was postponed. The date shown is not the original due date.   Preventive Care for Adults  A healthy lifestyle and preventive care can promote health and wellness. Preventive health guidelines for adults include the following key practices.  . A routine yearly physical is a good way to check with your health care provider about your health and preventive screening. It is a chance to share any concerns and updates on your health and to receive a thorough exam.  . Visit your dentist for a routine exam and preventive care every 6 months. Brush your teeth twice a day and floss once a day. Good oral hygiene prevents tooth decay and gum disease.  . The frequency of eye exams is based on your age, health, family medical history, use  of contact lenses, and other factors. Follow your health care provider's recommendations for frequency of eye exams.  . Eat a healthy diet. Foods like vegetables, fruits, whole grains, low-fat  dairy products, and lean protein foods contain the nutrients you need without too many calories. Decrease your intake of foods high in solid fats, added sugars, and salt. Eat the right amount of calories for you. Get information about a proper diet from your health care provider, if necessary.  . Regular physical exercise is one of the most important things you can do for your health. Most adults should get at least 150 minutes of moderate-intensity exercise (any activity that increases your heart rate and causes you to sweat) each week. In addition, most adults need muscle-strengthening exercises on 2 or more days a week.  Silver Sneakers may be a benefit available to you. To determine eligibility, you may visit the website: www.silversneakers.com or contact program at [phone] Mon-Fri between 8AM-8PM.   . Maintain a healthy weight. The body mass index (BMI) is a screening tool to identify possible weight problems. It provides an estimate of body fat based on height and weight. Your health care provider can find your BMI and can help you achieve or maintain a healthy weight.   For adults 20 years and older: ? A BMI below 18.5 is considered underweight. ? A BMI of 18.5 to 24.9 is normal. ? A BMI of 25 to 29.9 is considered overweight. ? A BMI of 30 and above is considered obese.   . Maintain normal blood lipids and cholesterol levels  by exercising and minimizing your intake of saturated fat. Eat a balanced diet with plenty of fruit and vegetables. Blood tests for lipids and cholesterol should begin at age 58 and be repeated every 5 years. If your lipid or cholesterol levels are high, you are over 50, or you are at high risk for heart disease, you may need your cholesterol levels checked more frequently. Ongoing high lipid and cholesterol levels should be treated with medicines if diet and exercise are not working.  . If you smoke, find out from your health care provider how to quit. If you do  not use tobacco, please do not start.  . If you choose to drink alcohol, please do not consume more than 2 drinks per day. One drink is considered to be 12 ounces (355 mL) of beer, 5 ounces (148 mL) of wine, or 1.5 ounces (44 mL) of liquor.  . If you are 72-61 years old, ask your health care provider if you should take aspirin to prevent strokes.  . Use sunscreen. Apply sunscreen liberally and repeatedly throughout the day. You should seek shade when your shadow is shorter than you. Protect yourself by wearing long sleeves, pants, a wide-brimmed hat, and sunglasses year round, whenever you are outdoors.  . Once a month, do a whole body skin exam, using a mirror to look at the skin on your back. Tell your health care provider of new moles, moles that have irregular borders, moles that are larger than a pencil eraser, or moles that have changed in shape or color.

## 2018-03-02 NOTE — Progress Notes (Signed)
PCP notes:   Health maintenance:  A1C- completed Tetanus vaccine - postponed/insurance  Abnormal screenings:   None  Patient concerns:   None  Nurse concerns:  None  Next PCP appt:   03/09/18 @ 0915

## 2018-03-02 NOTE — Progress Notes (Signed)
Subjective:   Melanie Lang is a 73 y.o. female who presents for Medicare Annual (Subsequent) preventive examination.  Review of Systems:  N/A Cardiac Risk Factors include: advanced age (>12men, >42 women);diabetes mellitus;obesity (BMI >30kg/m2);hypertension     Objective:     Vitals: BP (!) 152/100 (BP Location: Left Arm, Patient Position: Sitting, Cuff Size: Normal)   Pulse 82   Temp 98.4 F (36.9 C) (Oral)   Ht 5\' 5"  (1.651 m) Comment: shoes  Wt 204 lb 4 oz (92.6 kg)   SpO2 96%   BMI 33.99 kg/m   Body mass index is 33.99 kg/m.  Advanced Directives 03/02/2018 02/24/2017 02/14/2017 02/21/2016 07/04/2015 06/26/2015 12/19/2013  Does Patient Have a Medical Advance Directive? Yes Yes Yes Yes Yes No;Yes No  Type of Paramedic of [place];Living will [place];Living will Living will;Healthcare Power of [place];Living will [place];Living will [place];Living will -  Does patient want to make changes to medical advance directive? - - No - Patient declined - No - Patient declined No - Patient declined -  Copy of [place] in Chart? No - copy requested No - copy requested No - copy requested No - copy requested No - copy requested No - copy requested -  Would patient like information on creating a medical advance directive? - No - Patient declined No - Patient declined - - - No - patient declined information    Tobacco Social History   Tobacco Use  Smoking Status Never Smoker  Smokeless Tobacco Never Used     Counseling given: No   Clinical Intake:  Pre-visit preparation completed: Yes  Pain : No/denies pain Pain Score: 0-No pain     Nutritional Status: BMI > 30  Obese Nutritional Risks: None Diabetes: Yes CBG done?: No Did pt. bring in CBG monitor from home?: No  How often do you need to have someone help you when you read instructions,  pamphlets, or other written materials from your doctor or pharmacy?: 1 - Never What is the last grade level you completed in school?: 12th grade  Interpreter Needed?: No  Comments: pt lives with spouse Information entered by :: LPinson, LPN  Past Medical History:  Diagnosis Date  . Allergy   . Arthritis   . Asthma   . Diabetes mellitus without complication ([place])    borderline controlled with diet   . GERD (gastroesophageal reflux disease)   . Hyperlipidemia   . Hypertension   . Nephrolithiasis   . Osteopenia 04/08/2011   T-1.9 in 03/2011    Past Surgical History:  Procedure Laterality Date  . ABDOMINAL HYSTERECTOMY    . BILATERAL CARPAL TUNNEL RELEASE    . KNEE SURGERY    . TOENAIL EXCISION  02/16/2018  . TONSILLECTOMY    . 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 Asthma Mother   . Heart failure Father   . Stroke Father   . Cancer Maternal Grandmother        colon  . Diabetes Maternal Grandfather    Social History   Socioeconomic History  . Marital status: Married    Spouse name: Not on file  . Number of children: Not on file  . Years of education: Not on  file  . Highest education level: Not on file  Occupational History  . Occupation: semiretired(babysitter)    Employer: unemployed  Social Needs  . Financial resource strain: Not on file  . Food insecurity:    Worry: Not on file    Inability: Not on file  . Transportation needs:    Medical: Not on file    Non-medical: Not on file  Tobacco Use  . Smoking status: Never Smoker  . Smokeless tobacco: Never Used  Substance and Sexual Activity  . Alcohol use: No    Alcohol/week: 0.0 standard drinks  . Drug use: No  . Sexual activity: Never  Lifestyle  . Physical activity:    Days per week: Not on file    Minutes per session: Not on  file  . Stress: Not on file  Relationships  . Social connections:    Talks on phone: Not on file    Gets together: Not on file    Attends religious service: Not on file    Active member of club or organization: Not on file    Attends meetings of clubs or organizations: Not on file    Relationship status: Not on file  Other Topics Concern  . Not on file  Social History Narrative   Regular exercise--yes, walks   Diet: healthy, veggies, fruit, avoid salts                Outpatient Encounter Medications as of 03/02/2018  Medication Sig  . Alcohol Swabs (B-D SINGLE USE SWABS REGULAR) PADS Use to check blood sugar daily.  Dx: E11.9  . amLODipine (NORVASC) 10 MG tablet TAKE 1 TABLET EVERY DAY  . amoxicillin (AMOXIL) 500 MG capsule Take 2,000 mg by mouth daily as needed (before dental apptment).  Marland Kitchen aspirin EC 81 MG tablet Take 81 mg by mouth daily.  Marland Kitchen atorvastatin (LIPITOR) 20 MG tablet Take 1 tablet (20 mg total) by mouth daily.  . Blood Glucose Calibration (TRUE METRIX LEVEL 1) Low SOLN USE AS DIRECTED TO CHECK CONTROLS ON GLUCOSE METER.   . Blood Glucose Monitoring Suppl (TRUE METRIX AIR GLUCOSE METER) DEVI 1 each by Does not apply route daily. E11.9  . cholecalciferol (VITAMIN D) 1000 units tablet Take 1,000 Units by mouth daily.  . clotrimazole-betamethasone (LOTRISONE) cream Apply 1 application topically 2 (two) times daily.  Marland Kitchen COLCRYS 0.6 MG tablet TAKE 1 TABLET BY MOUTH 2 TIMES DAILY. (Patient taking differently: TAKE 1 TABLET  (0.6mg ) BY MOUTH 2 TIMES DAILY prn gout flare)  . furosemide (LASIX) 20 MG tablet TAKE 1 TABLET ONE TIME DAILY AS NEEDED FOR  FLUID (Patient taking differently: TAKE 1 TABLET (20 mg ) ONE TIME DAILY AS NEEDED FOR  FLUID)  . ibuprofen (ADVIL,MOTRIN) 800 MG tablet Take 1 tablet (800 mg total) by mouth every 8 (eight) hours as needed.  Marland Kitchen lisinopril (PRINIVIL,ZESTRIL) 40 MG tablet TAKE 1 TABLET EVERY DAY  . meloxicam (MOBIC) 15 MG tablet Take 1 tablet (15 mg  total) by mouth daily. (Patient taking differently: Take 15 mg by mouth daily as needed for pain. )  . Naphazoline-Pheniramine (OPCON-A OP) Place 1 drop into both eyes daily as needed (For allergies.).  Marland Kitchen omeprazole (PRILOSEC) 20 MG capsule Take 20 mg by mouth daily as needed (acid reflux).  . TRUE METRIX BLOOD GLUCOSE TEST test strip CHECK BLOOD SUGAR ONE TIME DAILY  . TRUEPLUS LANCETS 33G MISC CHECK BLOOD SUGAR EVERY DAY  . VITAMIN E PO Take 1 capsule by mouth  daily.   No facility-administered encounter medications on file as of 03/02/2018.     Activities of Daily Living In your present state of health, do you have any difficulty performing the following activities: 03/02/2018  Hearing? N  Vision? N  Difficulty concentrating or making decisions? N  Walking or climbing stairs? N  Dressing or bathing? N  Doing errands, shopping? N  Preparing Food and eating ? N  Using the Toilet? N  In the past six months, have you accidently leaked urine? N  Do you have problems with loss of bowel control? N  Managing your Medications? N  Managing your Finances? N  Housekeeping or managing your Housekeeping? N  Some recent data might be hidden    Patient Care Team: Jinny Sanders, MD as PCP - General    Assessment:   This is a routine wellness examination for [place].   Hearing Screening   125Hz  250Hz  500Hz  1000Hz  2000Hz  3000Hz  4000Hz  6000Hz  8000Hz   Right ear:   40 40 40  40    Left ear:   40 40 40  40    Vision Screening Comments: Vision exam in 2019    Exercise Activities and Dietary recommendations Current Exercise Habits: Home exercise routine, Type of exercise: walking(15000-18000 steps daily), Exercise limited by: None identified  Goals    . Follow up with Primary Care Provider     Starting 03/02/2018, I will continue to take medications as prescribed and to keep appointments with PCP as scheduled.        Fall Risk Fall Risk  03/02/2018 02/24/2017 02/21/2016 02/19/2015 01/06/2014    Falls in the past year? 0 No No No No   Depression Screen PHQ 2/9 Scores 03/02/2018 02/24/2017 02/21/2016 02/19/2015  PHQ - 2 Score 0 0 0 0  PHQ- 9 Score 0 0 - -     Cognitive Function MMSE - Mini Mental State Exam 03/02/2018 02/24/2017 02/21/2016  Orientation to time 5 5 5   Orientation to Place 5 5 5   Registration 3 3 3   Attention/ Calculation 0 0 0  Recall 3 3 3   Language- name 2 objects 0 0 0  Language- repeat 1 1 1   Language- follow 3 step command 3 3 3   Language- read & follow direction 0 0 0  Write a sentence 0 0 0  Copy design 0 0 0  Total score 20 20 20      PLEASE NOTE: A Mini-Cog screen was completed. Maximum score is 20. A value of 0 denotes this part of [place] MMSE was not completed or the patient failed this part of the Mini-Cog screening.   Mini-Cog Screening Orientation to Time - Max 5 pts Orientation to Place - Max 5 pts Registration - Max 3 pts Recall - Max 3 pts Language Repeat - Max 1 pts Language Follow 3 Step Command - Max 3 pts     Immunization History  Administered Date(s) Administered  . Pneumococcal Conjugate-13 09/19/2016  . Pneumococcal Polysaccharide-23 12/08/2011  . Td 01/21/2000   Screening Tests Health Maintenance  Topic Date Due  . TETANUS/TDAP  01/20/2020 (Originally 01/20/2010)  . HEMOGLOBIN A1C  08/31/2018  . FOOT EXAM  10/03/2018  . COLONOSCOPY  01/05/2019  . OPHTHALMOLOGY EXAM  01/30/2019  . MAMMOGRAM  01/28/2020  . DEXA SCAN  Completed  . Hepatitis C Screening  Completed  . PNA vac Low Risk Adult  Completed     Plan:     I have personally reviewed, addressed, and noted  the following in the patient's chart:  A. Medical and social history B. Use of alcohol, tobacco or illicit drugs  C. Current medications and supplements D. Functional ability and status E.  Nutritional status F.  Physical activity G. Advance directives H. List of other physicians I.  Hospitalizations, surgeries, and ER visits in previous 12 months J.   [place] to include hearing, vision, cognitive, depression L. Referrals and appointments - none  In addition, I have reviewed and discussed with patient certain preventive protocols, quality metrics, and best practice recommendations. A written personalized care plan for preventive services as well as general preventive health recommendations were provided to patient.  See attached scanned questionnaire for additional information.   Signed,   Lindell Noe, MHA, BS, LPN Health Coach

## 2018-03-03 ENCOUNTER — Other Ambulatory Visit: Payer: Self-pay | Admitting: Family Medicine

## 2018-03-03 NOTE — Progress Notes (Signed)
Patient is here today for follow-up appointment, date of procedures 02/16/2018, removal of the left great toenail.  She states that her big toe is doing fine, but she is concerned about the second nail that she feels like was cut short and is been bleeding.  Left hallux nail is free from signs and symptoms of infection, there is no erythema, no swelling, no drainage, no redness.  Second toenail left foot: Nailbed appears to be scabbing over and is not actively bleeding at this time.  There are no signs and symptoms of infection to the second toe.  Advised her to soak and Epson salts for couple more days, bandage the areas when wearing shoes, and leave the bandage off during the day and when sleeping.  She is to follow-up as needed with any acute symptom changes.

## 2018-03-04 NOTE — Progress Notes (Signed)
I reviewed health advisor's note, was available for consultation, and agree with documentation and plan.  

## 2018-03-09 ENCOUNTER — Encounter: Payer: Medicare HMO | Admitting: Family Medicine

## 2018-03-09 ENCOUNTER — Encounter: Payer: Self-pay | Admitting: Family Medicine

## 2018-03-09 ENCOUNTER — Ambulatory Visit (INDEPENDENT_AMBULATORY_CARE_PROVIDER_SITE_OTHER): Payer: Medicare HMO | Admitting: Family Medicine

## 2018-03-09 ENCOUNTER — Telehealth: Payer: Self-pay | Admitting: Family Medicine

## 2018-03-09 VITALS — BP 144/72 | HR 90 | Temp 98.3°F | Ht 65.0 in | Wt 203.5 lb

## 2018-03-09 DIAGNOSIS — I10 Essential (primary) hypertension: Secondary | ICD-10-CM | POA: Diagnosis not present

## 2018-03-09 DIAGNOSIS — E119 Type 2 diabetes mellitus without complications: Secondary | ICD-10-CM

## 2018-03-09 DIAGNOSIS — E785 Hyperlipidemia, unspecified: Secondary | ICD-10-CM | POA: Diagnosis not present

## 2018-03-09 DIAGNOSIS — Z0000 Encounter for general adult medical examination without abnormal findings: Secondary | ICD-10-CM

## 2018-03-09 DIAGNOSIS — M1A00X0 Idiopathic chronic gout, unspecified site, without tophus (tophi): Secondary | ICD-10-CM | POA: Diagnosis not present

## 2018-03-09 NOTE — Telephone Encounter (Signed)
IN donna's box. Enter in EMR if not already done.

## 2018-03-09 NOTE — Telephone Encounter (Signed)
Entered in EMR.

## 2018-03-09 NOTE — Telephone Encounter (Signed)
Pt stopped back by office to let you know that she gets her eyes checked at [facility] at [facility]

## 2018-03-09 NOTE — Telephone Encounter (Signed)
Noted  

## 2018-03-09 NOTE — Patient Instructions (Addendum)
Add HCTZ low dose.  Follow BP at home... goal < 140/90.  Call with measurements in  2 weeks.  Stay on low uric acid gout diet. Call if gout flare back on HCTZ. Can stop aspirin given stomach pain with it.  Try to take atorvasatain daily.

## 2018-03-09 NOTE — Telephone Encounter (Signed)
Eye exam office notes on your desk for review.

## 2018-03-09 NOTE — Assessment & Plan Note (Signed)
Aggressive low purine diet. Pt will call if flare on HCTZ.

## 2018-03-09 NOTE — Assessment & Plan Note (Signed)
Noncompliant with statin.Marland Kitchen encouraged pt to take daily. NO SE. Reviewed safety of medicaiton and importance of cholesterol control.

## 2018-03-09 NOTE — Telephone Encounter (Signed)
Requested office notes.

## 2018-03-09 NOTE — Progress Notes (Signed)
Subjective:    Patient ID: Melanie Lang, female    DOB: [birth year], 73 y.o.   MRN: [id number]  HPI  The patient presents for  complete physical and review of chronic health problems. He/She also has the following acute concerns today: none  The patient saw Candis Musa, LPN for medicare wellness. Note reviewed in detail and important notes copied below.  Health maintenance:  A1C- completed Tetanus vaccine - postponed/insurance  Abnormal screenings:   None  Patient concerns:   None  Nurse concerns:  None  03/09/18 Today  Hypertension:   Borderline control in office today.. on amlodipine and lisinopril  NO diuretic given gout.  BP Readings from Last 3 Encounters:  03/09/18 (!) 144/72  03/02/18 (!) 152/100  10/02/17 (!) 162/98  Using medication without problems or lightheadedness:  none Chest pain with exertion:none Edema:none Short of breath:none Average home BPs: 150/70 Other issues:  Diabetes:   At goal on diet control Lab Results  Component Value Date   HGBA1C 6.1 03/02/2018  Using medications without difficulties: Hypoglycemic episodes:none Hyperglycemic episodes:none Feet problems: followed by podiatry Blood Sugars averaging: FBS 102 eye exam within last year:  Done.  Elevated Cholesterol: LDL not at goal < 100 on atorvastatin 20 mg .. not taking daily.. nervous about the medication. Lab Results  Component Value Date   CHOL 223 (H) 03/02/2018   HDL 64.50 03/02/2018   LDLCALC 138 (H) 03/02/2018   LDLDIRECT 132.7 12/07/2012   TRIG 102.0 03/02/2018   CHOLHDL 3 03/02/2018  Using medications without problems: Muscle aches:  Diet compliance: Exercise: Other complaints:  Gout : no flares on no medications. She is on low uric acid diet. Lab Results  Component Value Date   LABURIC 6.2 03/02/2018     Social History /Family History/Past Medical History reviewed in detail and updated in EMR if needed. Blood pressure (!) 144/72,  pulse 90, temperature 98.3 F (36.8 C), temperature source Oral, height 5\' 5"  (1.651 m), weight 203 lb 8 oz (92.3 kg), SpO2 95 %.   Review of Systems  Constitutional: Negative for fatigue and fever.  HENT: Negative for congestion.   Eyes: Negative for pain.  Respiratory: Negative for cough and shortness of breath.   Cardiovascular: Negative for chest pain, palpitations and leg swelling.  Gastrointestinal: Negative for abdominal pain.  Genitourinary: Negative for dysuria and vaginal bleeding.  Musculoskeletal: Negative for back pain.  Neurological: Negative for syncope, light-headedness and headaches.  Psychiatric/Behavioral: Negative for dysphoric mood.       Objective:   Physical Exam Constitutional:      General: She is not in acute distress.    Appearance: Normal appearance. She is well-developed. She is obese. She is not ill-appearing or toxic-appearing.  HENT:     Head: Normocephalic.     Right Ear: Hearing, tympanic membrane, ear canal and external ear normal.     Left Ear: Hearing, tympanic membrane, ear canal and external ear normal.     Nose: Nose normal.  Eyes:     General: Lids are normal. Lids are everted, no foreign bodies appreciated.     Conjunctiva/sclera: Conjunctivae normal.     Pupils: Pupils are equal, round, and reactive to light.  Neck:     Musculoskeletal: Normal range of motion and neck supple.     Thyroid: No thyroid mass or thyromegaly.     Vascular: No carotid bruit.     Trachea: Trachea normal.  Cardiovascular:     Rate and Rhythm: Normal rate  and regular rhythm.     Heart sounds: Normal heart sounds, S1 normal and S2 normal. No murmur. No gallop.   Pulmonary:     Effort: Pulmonary effort is normal. No respiratory distress.     Breath sounds: Normal breath sounds. No wheezing, rhonchi or rales.  Abdominal:     General: Bowel sounds are normal. There is no distension or abdominal bruit.     Palpations: Abdomen is soft. There is no fluid wave or  mass.     Tenderness: There is no abdominal tenderness. There is no guarding or rebound.     Hernia: No hernia is present.  Lymphadenopathy:     Cervical: No cervical adenopathy.  Skin:    General: Skin is warm and dry.     Findings: No rash.  Neurological:     Mental Status: She is alert.     Cranial Nerves: No cranial nerve deficit.     Sensory: No sensory deficit.  Psychiatric:        Mood and Affect: Mood is not anxious or depressed.        Speech: Speech normal.        Behavior: Behavior normal. Behavior is cooperative.        Judgment: Judgment normal.       Diabetic foot exam: Inspection: deformity of great toes skin breakdown.. on left  3rd digit tip where debrided at podiatry and , great toenail removed on left foot No calluses  Normal DP pulses Normal sensation to light touch and monofilament Nails normal     Assessment & Plan:  The patient's preventative maintenance and recommended screening tests for an annual wellness exam were reviewed in full today. Brought up to date unless services declined.  Counselled on the importance of diet, exercise, and its role in overall health and mortality. The patient's FH and SH was reviewed, including their home life, tobacco status, and drug and alcohol status.   Vaccines:  tdap but refused Colon: 3 polyps 12/2013 Dr. Watt Climes. Repeat in 5 years. Mammogram:  nml in1/2020 DXA: osteoporosis 11/2016, T -3.8 on vit D,  repeat in 2 years  Started on vit D3. Pap/DVE: not indicated total hysterectomy for non cancer indication. Non smoker Hep C: done

## 2018-03-09 NOTE — Assessment & Plan Note (Signed)
Add back low dose diuretic ( offered bblocker but pt wishes to not try new med) if gout flare.. will have to stop. Encouraged exercise, weight loss, healthy eating habits.

## 2018-03-09 NOTE — Assessment & Plan Note (Signed)
Good control on  Diet control. Encouraged exercise, weight loss, healthy eating habits.

## 2018-03-19 ENCOUNTER — Telehealth: Payer: Self-pay | Admitting: Family Medicine

## 2018-03-19 MED ORDER — HYDROCHLOROTHIAZIDE 12.5 MG PO CAPS: 12.5000 mg | ORAL_CAPSULE | Freq: Every day | ORAL | 0 refills | Status: DC

## 2018-03-19 MED ORDER — HYDROCHLOROTHIAZIDE 12.5 MG PO CAPS: 12.5000 mg | ORAL_CAPSULE | Freq: Every day | ORAL | 3 refills | Status: DC

## 2018-03-19 NOTE — Telephone Encounter (Signed)
Pt has a question concerning a BP medication that was suppose to be called in because it was being changed by Dr Diona Browner. Please call pt.

## 2018-03-19 NOTE — Addendum Note (Signed)
Addended by: Carter Kitten on: 03/19/2018 02:13 PM   Modules accepted: Orders

## 2018-03-19 NOTE — Telephone Encounter (Signed)
Sent to [facility] 

## 2018-03-19 NOTE — Telephone Encounter (Signed)
Melanie Lang notified as instructed by telephone.  She would like a 30 day supply sent to local pharmacy so she can go ahead and start medication while waiting on mail order.  Rx for 30 day supply sent to [facility] on [address].

## 2018-03-19 NOTE — Telephone Encounter (Signed)
Looks like HCTZ was suppose to be sent in.  Will forward to Dr. Diona Browner to send in new prescription.

## 2018-04-14 ENCOUNTER — Other Ambulatory Visit: Payer: Self-pay | Admitting: Family Medicine

## 2018-04-30 ENCOUNTER — Ambulatory Visit: Payer: Medicare HMO | Admitting: Podiatry

## 2018-08-28 ENCOUNTER — Other Ambulatory Visit: Payer: Self-pay | Admitting: Family Medicine

## 2018-09-01 ENCOUNTER — Other Ambulatory Visit: Payer: Self-pay | Admitting: Family Medicine

## 2018-09-07 ENCOUNTER — Encounter: Payer: Self-pay | Admitting: Family Medicine

## 2018-09-07 ENCOUNTER — Ambulatory Visit (INDEPENDENT_AMBULATORY_CARE_PROVIDER_SITE_OTHER): Payer: Medicare HMO | Admitting: Family Medicine

## 2018-09-07 VITALS — BP 138/82 | Ht 65.0 in | Wt 201.0 lb

## 2018-09-07 DIAGNOSIS — E119 Type 2 diabetes mellitus without complications: Secondary | ICD-10-CM | POA: Diagnosis not present

## 2018-09-07 DIAGNOSIS — E785 Hyperlipidemia, unspecified: Secondary | ICD-10-CM

## 2018-09-07 DIAGNOSIS — M1A00X0 Idiopathic chronic gout, unspecified site, without tophus (tophi): Secondary | ICD-10-CM

## 2018-09-07 DIAGNOSIS — R202 Paresthesia of skin: Secondary | ICD-10-CM | POA: Diagnosis not present

## 2018-09-07 DIAGNOSIS — I10 Essential (primary) hypertension: Secondary | ICD-10-CM

## 2018-09-07 DIAGNOSIS — R200 Anesthesia of skin: Secondary | ICD-10-CM | POA: Diagnosis not present

## 2018-09-07 DIAGNOSIS — J309 Allergic rhinitis, unspecified: Secondary | ICD-10-CM

## 2018-09-07 MED ORDER — CLOTRIMAZOLE-BETAMETHASONE 1-0.05 % EX CREA: 1.0000 "application " | TOPICAL_CREAM | Freq: Two times a day (BID) | CUTANEOUS | 0 refills | Status: AC | PRN

## 2018-09-07 NOTE — Assessment & Plan Note (Signed)
Due for re-eval back on atorvastatin daily.

## 2018-09-07 NOTE — Patient Instructions (Addendum)
Make an appointment  For fasting labs when able to.  Can start flonase 2 sprays per nostril daily or  Zyrtec at bedtime. NO decongestant.

## 2018-09-07 NOTE — Assessment & Plan Note (Signed)
May be connected to sciatic nothc pain/hip pain new. Eval b12. If not improving needs in office exam and possible steroid taper.  Start heat , gentle stretching on low back.

## 2018-09-07 NOTE — Assessment & Plan Note (Signed)
Trial of flonase and/ or zyrtec.

## 2018-09-07 NOTE — Assessment & Plan Note (Signed)
Improved control with diet changes and back on low dose HCTZ.  She would like to have goal of weight loss and stopping HCTZ to lower gout flare risk and number of pills she is taking.

## 2018-09-07 NOTE — Progress Notes (Signed)
VIRTUAL VISIT Due to national recommendations of social distancing due to [place] 19, a virtual visit is felt to be most appropriate for this patient at this time.   I connected with the patient on 09/07/18 at  9:20 AM EDT by virtual telehealth platform and verified that I am speaking with the correct person using two identifiers.   I discussed the limitations, risks, security and privacy concerns of performing an evaluation and management service by  virtual telehealth platform and the availability of in person appointments. I also discussed with the patient that there may be a patient responsible charge related to this service. The patient expressed understanding and agreed to proceed.  Patient location: Home Provider Location: [facility] Participants: Eliezer Lofts and Sanjuana Mae   Chief Complaint  Patient presents with  . Follow-up    6 month    History of Present Illness: 73 year old female presents for 6 month follow up.  Hypertension:    Improved control... on amlodipine and lisinopril and now back on HCTZ Started back HCTZ...  BUT  NO gout flares  .. eating low purine diet. BP Readings from Last 3 Encounters:  09/07/18 138/82  03/09/18 (!) 144/72  03/02/18 (!) 152/100  Using medication without problems or lightheadedness: none Chest pain with exertion:none Edema:none Short of breath:none Average home BPs: Other issues:  Diabetes:  Due for re-eval. Lab Results  Component Value Date   HGBA1C 6.1 03/02/2018  Using medications without difficulties: Hypoglycemic episodes:none Hyperglycemic episodes:none Feet problems:  No ulcers Blood Sugars averaging: FBS 108 eye exam within last year: 12/2017  tingling in foot  Elevated Cholesterol: Due for re-eval on atorvastatin Using medications without problems: none Muscle aches: none Diet compliance: working healthier eating Exercise:none Other complaints:  Wt Readings from Last 3 Encounters:  09/07/18  201 lb (91.2 kg)  03/09/18 203 lb 8 oz (92.3 kg)  03/02/18 204 lb 4 oz (92.6 kg)     COVID 19 screen No recent travel or known exposure to [place] The patient denies respiratory symptoms of COVID 19 at this time.  The importance of social distancing was discussed today.   Review of Systems  Constitutional: Negative for chills and fever.  HENT: Negative for congestion and ear pain.        Post nasal drip.Marland Kitchen itchy eyes  Eyes: Negative for pain and redness.  Respiratory: Negative for cough and shortness of breath.   Cardiovascular: Negative for chest pain, palpitations and leg swelling.  Gastrointestinal: Negative for abdominal pain, blood in stool, constipation, diarrhea, nausea and vomiting.  Genitourinary: Negative for dysuria.  Musculoskeletal: Negative for falls and myalgias.  Skin: Negative for rash.  Neurological: Negative for dizziness.  Psychiatric/Behavioral: Negative for depression. The patient is not nervous/anxious.       Past Medical History:  Diagnosis Date  . Allergy   . Arthritis   . Asthma   . Diabetes mellitus without complication ([place])    borderline controlled with diet   . GERD (gastroesophageal reflux disease)   . Hyperlipidemia   . Hypertension   . Nephrolithiasis   . Osteopenia 04/08/2011   T-1.9 in 03/2011     reports that she has never smoked. She has never used smokeless tobacco. She reports that she does not drink alcohol or use drugs.   Current Outpatient Medications:  .  Alcohol Swabs (B-D SINGLE USE SWABS REGULAR) PADS, Use to check blood sugar daily.  Dx: E11.9, Disp: 100 each, Rfl: 3 .  amLODipine (  NORVASC) 10 MG tablet, TAKE 1 TABLET EVERY DAY, Disp: 90 tablet, Rfl: 1 .  amoxicillin (AMOXIL) 500 MG capsule, Take 2,000 mg by mouth daily as needed (before dental apptment)., Disp: , Rfl:  .  aspirin EC 81 MG tablet, Take 81 mg by mouth daily., Disp: , Rfl:  .  atorvastatin (LIPITOR) 20 MG tablet, Take 1 tablet (20 mg total) by mouth daily., Disp:  90 tablet, Rfl: 3 .  Blood Glucose Calibration (TRUE METRIX LEVEL 1) Low SOLN, USE AS DIRECTED TO CHECK CONTROLS ON GLUCOSE METER. , Disp: 1 each, Rfl: 3 .  Blood Glucose Monitoring Suppl (TRUE METRIX AIR GLUCOSE METER) DEVI, 1 each by Does not apply route daily. E11.9, Disp: 1 Device, Rfl: 0 .  cholecalciferol (VITAMIN D) 1000 units tablet, Take 1,000 Units by mouth daily., Disp: , Rfl:  .  clotrimazole-betamethasone (LOTRISONE) cream, Apply 1 application topically 2 (two) times daily., Disp: 15 g, Rfl: 0 .  colchicine 0.6 MG tablet, Take 0.6 mg by mouth 2 (two) times daily as needed., Disp: , Rfl:  .  furosemide (LASIX) 20 MG tablet, Take 20 mg by mouth daily as needed., Disp: , Rfl:  .  hydrochlorothiazide (MICROZIDE) 12.5 MG capsule, TAKE 1 CAPSULE BY MOUTH EVERY DAY, Disp: 90 capsule, Rfl: 1 .  ibuprofen (ADVIL,MOTRIN) 800 MG tablet, Take 1 tablet (800 mg total) by mouth every 8 (eight) hours as needed., Disp: 12 tablet, Rfl: 0 .  lisinopril (ZESTRIL) 40 MG tablet, TAKE 1 TABLET EVERY DAY, Disp: 90 tablet, Rfl: 1 .  Naphazoline-Pheniramine (OPCON-A OP), Place 1 drop into both eyes daily as needed (For allergies.)., Disp: , Rfl:  .  omeprazole (PRILOSEC) 20 MG capsule, Take 20 mg by mouth daily as needed (acid reflux)., Disp: , Rfl:  .  TRUE METRIX BLOOD GLUCOSE TEST test strip, CHECK BLOOD SUGAR ONE TIME DAILY, Disp: 100 each, Rfl: 3 .  TRUEPLUS LANCETS 33G MISC, CHECK BLOOD SUGAR EVERY DAY, Disp: 100 each, Rfl: 3 .  VITAMIN E PO, Take 1 capsule by mouth daily., Disp: , Rfl:    Observations/Objective: Blood pressure 138/82, height 5\' 5"  (1.651 m), weight 201 lb (91.2 kg).  Physical Exam  Physical Exam Constitutional:      General: The patient is not in acute distress. Pulmonary:     Effort: Pulmonary effort is normal. No respiratory distress.  Neurological:     Mental Status: The patient is alert and oriented to person, place, and time.  Psychiatric:        Mood and Affect: Mood  normal.        Behavior: Behavior normal.   Assessment and Plan   Numbness of right foot May be connected to sciatic nothc pain/hip pain new. Eval b12. If not improving needs in office exam and possible steroid taper.  Start heat , gentle stretching on low back.  Hyperlipidemia Due for re-eval back on atorvastatin daily.  Diabetes mellitus with no complication Well controlled. Continue current medication.   Essential hypertension, benign Improved control with diet changes and back on low dose HCTZ.  She would like to have goal of weight loss and stopping HCTZ to lower gout flare risk and number of pills she is taking.  Allergic rhinitis Trial of flonase and/ or zyrtec.  Gout, chronic, without tophus No flares back on HCTZ low dose, working on low purine diet.   I discussed the assessment and treatment plan with the patient. The patient was provided an opportunity to ask questions  and all were answered. The patient agreed with the plan and demonstrated an understanding of the instructions.   The patient was advised to call back or seek an in-person evaluation if the symptoms worsen or if the condition fails to improve as anticipated.     Eliezer Lofts, MD

## 2018-09-07 NOTE — Assessment & Plan Note (Signed)
No flares back on HCTZ low dose, working on low purine diet.

## 2018-09-07 NOTE — Assessment & Plan Note (Signed)
Well controlled. Continue current medication.  

## 2018-09-08 ENCOUNTER — Telehealth: Payer: Self-pay | Admitting: Family Medicine

## 2018-09-08 DIAGNOSIS — E119 Type 2 diabetes mellitus without complications: Secondary | ICD-10-CM

## 2018-09-08 NOTE — Telephone Encounter (Signed)
Best number [phone]  Pt called needing to get a referral for insurance purposes only For [facility] Pt has appointment 09/13/2018  For toe nail trim

## 2018-09-13 ENCOUNTER — Ambulatory Visit: Payer: Medicare HMO | Admitting: Podiatry

## 2018-09-13 ENCOUNTER — Encounter: Payer: Self-pay | Admitting: Podiatry

## 2018-09-13 ENCOUNTER — Other Ambulatory Visit: Payer: Self-pay

## 2018-09-13 VITALS — Temp 97.7°F

## 2018-09-13 DIAGNOSIS — B351 Tinea unguium: Secondary | ICD-10-CM

## 2018-09-13 DIAGNOSIS — E119 Type 2 diabetes mellitus without complications: Secondary | ICD-10-CM

## 2018-09-13 DIAGNOSIS — L980 Pyogenic granuloma: Secondary | ICD-10-CM | POA: Diagnosis not present

## 2018-09-13 DIAGNOSIS — M79675 Pain in left toe(s): Secondary | ICD-10-CM

## 2018-09-13 DIAGNOSIS — M79674 Pain in right toe(s): Secondary | ICD-10-CM | POA: Diagnosis not present

## 2018-09-13 DIAGNOSIS — B353 Tinea pedis: Secondary | ICD-10-CM

## 2018-09-13 MED ORDER — CLOTRIMAZOLE 1 % EX CREA: TOPICAL_CREAM | CUTANEOUS | 0 refills | Status: AC

## 2018-09-13 NOTE — Patient Instructions (Signed)
Diabetes Mellitus and Foot Care Foot care is an important part of your health, especially when you have diabetes. Diabetes may cause you to have problems because of poor blood flow (circulation) to your feet and legs, which can cause your skin to:  Become thinner and drier.  Break more easily.  Heal more slowly.  Peel and crack. You may also have nerve damage (neuropathy) in your legs and feet, causing decreased feeling in them. This means that you may not notice minor injuries to your feet that could lead to more serious problems. Noticing and addressing any potential problems early is the best way to prevent future foot problems. How to care for your feet Foot hygiene  Wash your feet daily with warm water and mild soap. Do not use hot water. Then, pat your feet and the areas between your toes until they are completely dry. Do not soak your feet as this can dry your skin.  Trim your toenails straight across. Do not dig under them or around the cuticle. File the edges of your nails with an emery board or nail file.  Apply a moisturizing lotion or petroleum jelly to the skin on your feet and to dry, brittle toenails. Use lotion that does not contain alcohol and is unscented. Do not apply lotion between your toes. Shoes and socks  Wear clean socks or stockings every day. Make sure they are not too tight. Do not wear knee-high stockings since they may decrease blood flow to your legs.  Wear shoes that fit properly and have enough cushioning. Always look in your shoes before you put them on to be sure there are no objects inside.  To break in new shoes, wear them for just a few hours a day. This prevents injuries on your feet. Wounds, scrapes, corns, and calluses  Check your feet daily for blisters, cuts, bruises, sores, and redness. If you cannot see the bottom of your feet, use a mirror or ask someone for help.  Do not cut corns or calluses or try to remove them with medicine.  If you  find a minor scrape, cut, or break in the skin on your feet, keep it and the skin around it clean and dry. You may clean these areas with mild soap and water. Do not clean the area with peroxide, alcohol, or iodine.  If you have a wound, scrape, corn, or callus on your foot, look at it several times a day to make sure it is healing and not infected. Check for: ? Redness, swelling, or pain. ? Fluid or blood. ? Warmth. ? Pus or a bad smell. General instructions  Do not cross your legs. This may decrease blood flow to your feet.  Do not use heating pads or hot water bottles on your feet. They may burn your skin. If you have lost feeling in your feet or legs, you may not know this is happening until it is too late.  Protect your feet from hot and cold by wearing shoes, such as at the beach or on hot pavement.  Schedule a complete foot exam at least once a year (annually) or more often if you have foot problems. If you have foot problems, report any cuts, sores, or bruises to your health care provider immediately. Contact a health care provider if:  You have a medical condition that increases your risk of infection and you have any cuts, sores, or bruises on your feet.  You have an injury that is not   healing.  You have redness on your legs or feet.  You feel burning or tingling in your legs or feet.  You have pain or cramps in your legs and feet.  Your legs or feet are numb.  Your feet always feel cold.  You have pain around a toenail. Get help right away if:  You have a wound, scrape, corn, or callus on your foot and: ? You have pain, swelling, or redness that gets worse. ? You have fluid or blood coming from the wound, scrape, corn, or callus. ? Your wound, scrape, corn, or callus feels warm to the touch. ? You have pus or a bad smell coming from the wound, scrape, corn, or callus. ? You have a fever. ? You have a red line going up your leg. Summary  Check your feet every day  for cuts, sores, red spots, swelling, and blisters.  Moisturize feet and legs daily.  Wear shoes that fit properly and have enough cushioning.  If you have foot problems, report any cuts, sores, or bruises to your health care provider immediately.  Schedule a complete foot exam at least once a year (annually) or more often if you have foot problems. This information is not intended to replace advice given to you by your health care provider. Make sure you discuss any questions you have with your health care provider. Document Released: 01/04/2000 Document Revised: 02/18/2017 Document Reviewed: 02/08/2016 Elsevier Patient Education  2020 [employer]   Onychomycosis/Fungal Toenails  WHAT IS IT? An infection that lies within the keratin of your nail plate that is caused by a fungus.  WHY ME? Fungal infections affect all ages, sexes, races, and creeds.  There may be many factors that predispose you to a fungal infection such as age, coexisting medical conditions such as diabetes, or an autoimmune disease; stress, medications, fatigue, genetics, etc.  Bottom line: fungus thrives in a warm, moist environment and your shoes offer such a location.  IS IT CONTAGIOUS? Theoretically, yes.  You do not want to share shoes, nail clippers or files with someone who has fungal toenails.  Walking around barefoot in the same room or sleeping in the same bed is unlikely to transfer the organism.  It is important to realize, however, that fungus can spread easily from one nail to the next on the same foot.  HOW DO WE TREAT THIS?  There are several ways to treat this condition.  Treatment may depend on many factors such as age, medications, pregnancy, liver and kidney conditions, etc.  It is best to ask your doctor which options are available to you.  1. No treatment.   Unlike many other medical concerns, you can live with this condition.  However for many people this can be a painful condition and may lead to  ingrown toenails or a bacterial infection.  It is recommended that you keep the nails cut short to help reduce the amount of fungal nail. 2. Topical treatment.  These range from herbal remedies to prescription strength nail lacquers.  About 40-50% effective, topicals require twice daily application for approximately 9 to 12 months or until an entirely new nail has grown out.  The most effective topicals are medical grade medications available through physicians offices. 3. Oral antifungal medications.  With an 80-90% cure rate, the most common oral medication requires 3 to 4 months of therapy and stays in your system for a year as the new nail grows out.  Oral antifungal medications do require   blood work to make sure it is a safe drug for you.  A liver function panel will be performed prior to starting the medication and after the first month of treatment.  It is important to have the blood work performed to avoid any harmful side effects.  In general, this medication safe but blood work is required. 4. Laser Therapy.  This treatment is performed by applying a specialized laser to the affected nail plate.  This therapy is noninvasive, fast, and non-painful.  It is not covered by insurance and is therefore, out of pocket.  The results have been very good with a 80-95% cure rate.  [facility] is the only practice in the area to offer this therapy. 5. Permanent Nail Avulsion.  Removing the entire nail so that a new nail will not grow back. 

## 2018-09-16 ENCOUNTER — Other Ambulatory Visit (INDEPENDENT_AMBULATORY_CARE_PROVIDER_SITE_OTHER): Payer: Medicare HMO

## 2018-09-16 ENCOUNTER — Other Ambulatory Visit: Payer: Self-pay

## 2018-09-16 ENCOUNTER — Other Ambulatory Visit: Payer: Self-pay | Admitting: Family Medicine

## 2018-09-16 DIAGNOSIS — E785 Hyperlipidemia, unspecified: Secondary | ICD-10-CM | POA: Diagnosis not present

## 2018-09-16 DIAGNOSIS — E119 Type 2 diabetes mellitus without complications: Secondary | ICD-10-CM | POA: Diagnosis not present

## 2018-09-16 DIAGNOSIS — R202 Paresthesia of skin: Secondary | ICD-10-CM | POA: Diagnosis not present

## 2018-09-16 LAB — COMPREHENSIVE METABOLIC PANEL
ALT: 12 U/L (ref 0–35)
AST: 17 U/L (ref 0–37)
Albumin: 4.3 g/dL (ref 3.5–5.2)
Alkaline Phosphatase: 54 U/L (ref 39–117)
BUN: 21 mg/dL (ref 6–23)
CO2: 30 mEq/L (ref 19–32)
Calcium: 9.9 mg/dL (ref 8.4–10.5)
Chloride: 100 mEq/L (ref 96–112)
Creatinine, Ser: 0.83 mg/dL (ref 0.40–1.20)
GFR: 81.52 mL/min (ref >60.00)
Glucose, Bld: 113 mg/dL — ABNORMAL HIGH (ref 70–99)
Potassium: 3.8 mEq/L (ref 3.5–5.1)
Sodium: 139 mEq/L (ref 135–145)
Total Bilirubin: 0.8 mg/dL (ref 0.2–1.2)
Total Protein: 7.5 g/dL (ref 6.0–8.3)

## 2018-09-16 LAB — HEMOGLOBIN A1C: Hgb A1c MFr Bld: 6.4 % (ref 4.6–6.5)

## 2018-09-16 LAB — LIPID PANEL
Cholesterol: 230 mg/dL — ABNORMAL HIGH (ref 0–200)
HDL: 69.4 mg/dL (ref >39.00)
LDL Cholesterol: 138 mg/dL — ABNORMAL HIGH (ref 0–99)
NonHDL: 160.61
Total CHOL/HDL Ratio: 3
Triglycerides: 112 mg/dL (ref 0.0–149.0)
VLDL: 22.4 mg/dL (ref 0.0–40.0)

## 2018-09-16 LAB — VITAMIN B12: Vitamin B-12: 380 pg/mL (ref 211–911)

## 2018-09-21 NOTE — Progress Notes (Signed)
Subjective: Sanjuana Mae presents today with painful, thick toenails 1-5 b/l that she cannot cut and which interfere with daily activities.  Pain is aggravated when wearing enclosed shoe gear.  Patient concerned about appearance of left nailbed.  She states area is still bleeding under the toenail.  She denies any purulence coming from the area.  She is just been trying to keep the area clean and dry.   Allergies  Allergen Reactions  . Shellfish Allergy Itching, Swelling and Other (See Comments)    Facial swelling  . Eggs Or Egg-Derived Products Itching  . Peanuts [Peanut Oil] Itching and Other (See Comments)    Throat itching    Objective: Vitals:   09/13/18 1136  Temp: 97.7 F (36.5 C)    Vascular Examination: Capillary refill time immediate x 10 digits.  Dorsalis pedis and Posterior tibial pulses palpable b/l.  Digital hair present x 10 digits.  Skin temperature gradient WNL b/l.  Dermatological Examination: Skin with normal turgor, texture and tone b/l.  Toenails 1-5 b/l discolored, thick, dystrophic with subungual debris and pain with palpation to nailbeds due to thickness of nails. There is noted onchyolysis of  nailplate of the right hallux.  The nailbed remains intact. Thee is no erythema, no edema, no drainage, no underlying flocculence.  Anonychia left great toe(s) with evidence of recent temporary total nail avulsion. Nailbed(s) completely epithelialized and intact.  There is a small raised lesion noted on the dorsal aspect of left third digit nailbed.  It is granulomatous in nature.  Diffuse scaling noted peripherally and plantarly b/l feet with mild foot odor.  No interdigital macerations.  No blisters, no weeping. No signs of secondary bacterial infection noted.  Musculoskeletal: Muscle strength 5/5 to all LE muscle groups  No gross bony deformities b/l.  No pain, crepitus or joint limitation noted with ROM.   Neurological: Sensation intact with  10 gram monofilament.  Vibratory sensation intact.  Assessment: Painful onychomycosis toenails right great toe and 2-5 b/l  Pyogenic granuloma nailbed left third digit Tinea pedis bilaterally  Plan: 1.  Patient seen with Dr.Wagoner. 2.  Toenails right great toe and 2-5 b/l  were debrided in length and girth without iatrogenic bleeding. 3.  For tinea pedis, Rx for Clotrimazole Cream 1% to be applied to both feet and between toes bid x 4 weeks. 4.  For pyogenic granuloma left 3rd digit, silver nitrate was applied to the left third digit nailbed. 5.  Patient to continue soft, supportive shoe gear daily. 6.  Patient to report any pedal injuries to medical professional immediately. 7.  Follow up month for left 3rd digit. 8.  Patient/POA to call should there be a concern in the interim.

## 2018-09-23 ENCOUNTER — Ambulatory Visit: Payer: Medicare HMO | Admitting: Podiatry

## 2018-09-23 ENCOUNTER — Encounter: Payer: Self-pay | Admitting: Podiatry

## 2018-09-23 ENCOUNTER — Other Ambulatory Visit: Payer: Self-pay

## 2018-09-23 DIAGNOSIS — L980 Pyogenic granuloma: Secondary | ICD-10-CM

## 2018-09-23 DIAGNOSIS — M21619 Bunion of unspecified foot: Secondary | ICD-10-CM | POA: Diagnosis not present

## 2018-09-23 NOTE — Patient Instructions (Signed)

## 2018-09-24 NOTE — Progress Notes (Signed)
Subjective:   Patient ID: Melanie Lang, female   DOB: 73 y.o.   MRN: [id number]   HPI Patient presents concerned about several nails having some crusted tissue and wants to make sure they are healing okay   ROS      Objective:  Physical Exam  Neurovascular status intact with patient having had chronic nail disease and lost several nails hallux right third left that are very crusted over but no proximal edema erythema drainage noted     Assessment:  Healing nailbeds with no indications currently there is active infection present     Plan:  Reviewed ingrown toenails and discussed the causes of this and at this point it may require permanent procedure which I educated her on.  Continue soaks and if symptoms were to persist we will need to consider other treatment plan

## 2018-11-05 DIAGNOSIS — M25551 Pain in right hip: Secondary | ICD-10-CM | POA: Diagnosis not present

## 2018-11-05 DIAGNOSIS — Z471 Aftercare following joint replacement surgery: Secondary | ICD-10-CM | POA: Diagnosis not present

## 2018-11-05 DIAGNOSIS — M545 Low back pain: Secondary | ICD-10-CM | POA: Diagnosis not present

## 2018-11-05 DIAGNOSIS — Z96652 Presence of left artificial knee joint: Secondary | ICD-10-CM | POA: Diagnosis not present

## 2018-11-17 DIAGNOSIS — M545 Low back pain: Secondary | ICD-10-CM | POA: Diagnosis not present

## 2018-12-02 DIAGNOSIS — M48061 Spinal stenosis, lumbar region without neurogenic claudication: Secondary | ICD-10-CM | POA: Diagnosis not present

## 2018-12-02 DIAGNOSIS — M545 Low back pain: Secondary | ICD-10-CM | POA: Diagnosis not present

## 2018-12-21 DIAGNOSIS — M5136 Other intervertebral disc degeneration, lumbar region: Secondary | ICD-10-CM | POA: Diagnosis not present

## 2019-01-04 DIAGNOSIS — M5416 Radiculopathy, lumbar region: Secondary | ICD-10-CM | POA: Diagnosis not present

## 2019-01-06 ENCOUNTER — Other Ambulatory Visit: Payer: Self-pay | Admitting: Family Medicine

## 2019-01-07 ENCOUNTER — Other Ambulatory Visit: Payer: Self-pay | Admitting: Family Medicine

## 2019-01-15 ENCOUNTER — Other Ambulatory Visit: Payer: Self-pay | Admitting: Family Medicine

## 2019-01-27 ENCOUNTER — Other Ambulatory Visit: Payer: Self-pay | Admitting: Family Medicine

## 2019-03-08 ENCOUNTER — Ambulatory Visit: Payer: Medicare HMO

## 2019-03-15 ENCOUNTER — Encounter: Payer: Medicare HMO | Admitting: Family Medicine

## 2019-04-06 ENCOUNTER — Telehealth: Payer: Self-pay | Admitting: Family Medicine

## 2019-04-07 NOTE — Telephone Encounter (Signed)
Please try to reschedule [place] with nurse and CPE with Dr. Diona Browner.

## 2019-04-12 NOTE — Telephone Encounter (Signed)
Tried calling pt voicemail full

## 2019-04-14 NOTE — Telephone Encounter (Signed)
Spoke with pt she transferred care dr Kristie Cowman

## 2019-04-20 ENCOUNTER — Other Ambulatory Visit: Payer: Self-pay | Admitting: Family Medicine

## 2019-04-20 DIAGNOSIS — Z1231 Encounter for screening mammogram for malignant neoplasm of breast: Secondary | ICD-10-CM

## 2019-05-23 ENCOUNTER — Other Ambulatory Visit: Payer: Self-pay

## 2019-05-23 ENCOUNTER — Ambulatory Visit
Admission: RE | Admit: 2019-05-23 | Discharge: 2019-05-23 | Disposition: A | Discharge status: Home / Self Care | Payer: Medicare HMO | Source: Ambulatory Visit | Attending: Family Medicine | Admitting: Family Medicine

## 2019-05-23 DIAGNOSIS — Z1231 Encounter for screening mammogram for malignant neoplasm of breast: Secondary | ICD-10-CM

## 2019-06-17 ENCOUNTER — Other Ambulatory Visit: Payer: Self-pay | Admitting: Family Medicine

## 2020-10-10 ENCOUNTER — Other Ambulatory Visit: Payer: Self-pay | Admitting: Family Medicine

## 2020-10-10 DIAGNOSIS — Z1231 Encounter for screening mammogram for malignant neoplasm of breast: Secondary | ICD-10-CM

## 2020-10-12 ENCOUNTER — Ambulatory Visit
Admission: RE | Admit: 2020-10-12 | Discharge: 2020-10-12 | Disposition: A | Discharge status: Home / Self Care | Payer: Medicare HMO | Source: Ambulatory Visit | Attending: Family Medicine | Admitting: Family Medicine

## 2020-10-12 ENCOUNTER — Other Ambulatory Visit: Payer: Self-pay

## 2020-10-12 DIAGNOSIS — Z1231 Encounter for screening mammogram for malignant neoplasm of breast: Secondary | ICD-10-CM

## 2021-09-20 ENCOUNTER — Other Ambulatory Visit: Payer: Self-pay | Admitting: Family Medicine

## 2021-09-20 DIAGNOSIS — Z1231 Encounter for screening mammogram for malignant neoplasm of breast: Secondary | ICD-10-CM

## 2021-10-14 ENCOUNTER — Ambulatory Visit: Payer: Medicare HMO

## 2021-11-08 ENCOUNTER — Ambulatory Visit
Admission: RE | Admit: 2021-11-08 | Discharge: 2021-11-08 | Disposition: A | Discharge status: Home / Self Care | Payer: Medicare HMO | Source: Ambulatory Visit | Attending: Family Medicine | Admitting: Family Medicine

## 2021-11-08 DIAGNOSIS — Z1231 Encounter for screening mammogram for malignant neoplasm of breast: Secondary | ICD-10-CM

## 2021-12-23 ENCOUNTER — Ambulatory Visit (INDEPENDENT_AMBULATORY_CARE_PROVIDER_SITE_OTHER): Payer: Medicare HMO

## 2021-12-23 ENCOUNTER — Ambulatory Visit: Payer: Medicare HMO | Admitting: Podiatry

## 2021-12-23 VITALS — BP 156/89 | HR 87 | Temp 97.3°F | Resp 17

## 2021-12-23 DIAGNOSIS — M21619 Bunion of unspecified foot: Secondary | ICD-10-CM

## 2021-12-23 DIAGNOSIS — M7752 Other enthesopathy of left foot: Secondary | ICD-10-CM

## 2021-12-23 DIAGNOSIS — M7750 Other enthesopathy of unspecified foot: Secondary | ICD-10-CM | POA: Diagnosis not present

## 2021-12-23 MED ORDER — MELOXICAM 7.5 MG PO TABS: 7.5000 mg | ORAL_TABLET | Freq: Every day | ORAL | 0 refills | Status: AC | PRN

## 2021-12-23 NOTE — Progress Notes (Unsigned)
Subjective:   Patient ID: Melanie Lang, female   DOB: 76 y.o.   MRN: [id number]   HPI Chief Complaint  Patient presents with   Gout    Patient came in today for left foot bunion pain, which started 3 weeks ago, patient was seen by PCP,  Gout, patient is Having less pain but still having swelling, rate of pain 7 out of 10, X-rays done today    She had gout 2 weeks ago for the first time in 25 years ago. Still was not having pain on the foot until she had the gout flare. She was on HCTZ and her PCP took her off of it. Pain is now on the lateral foot, likely from the way she walked.    ROS      Objective:  Physical Exam  *** Peroneal tendonitis distal to ankle to 5th met base    Assessment:  ***     Plan:  ***

## 2022-01-15 ENCOUNTER — Other Ambulatory Visit: Payer: Self-pay | Admitting: Podiatry

## 2022-01-15 DIAGNOSIS — M21619 Bunion of unspecified foot: Secondary | ICD-10-CM

## 2022-01-15 DIAGNOSIS — M7750 Other enthesopathy of unspecified foot: Secondary | ICD-10-CM

## 2022-12-05 ENCOUNTER — Other Ambulatory Visit: Payer: Self-pay | Admitting: Family Medicine

## 2022-12-05 DIAGNOSIS — Z1231 Encounter for screening mammogram for malignant neoplasm of breast: Secondary | ICD-10-CM

## 2023-01-02 ENCOUNTER — Ambulatory Visit
Admission: RE | Admit: 2023-01-02 | Discharge: 2023-01-02 | Disposition: A | Discharge status: Home / Self Care | Payer: Medicare HMO | Source: Ambulatory Visit | Attending: Family Medicine | Admitting: Family Medicine

## 2023-01-02 DIAGNOSIS — Z1231 Encounter for screening mammogram for malignant neoplasm of breast: Secondary | ICD-10-CM

## 2023-01-07 ENCOUNTER — Encounter: Payer: Self-pay | Admitting: Family Medicine
